# Patient Record
Sex: Female | Born: 1960 | Race: White | Hispanic: No | Smoking: Never smoker
Health system: Southern US, Community
[De-identification: ages and names within clinical notes are randomized; demographics above are authoritative.]

## PROBLEM LIST (undated history)

## (undated) DIAGNOSIS — D689 Coagulation defect, unspecified: Secondary | ICD-10-CM

## (undated) DIAGNOSIS — Z5189 Encounter for other specified aftercare: Secondary | ICD-10-CM

## (undated) DIAGNOSIS — N3281 Overactive bladder: Secondary | ICD-10-CM

## (undated) DIAGNOSIS — Z86718 Personal history of other venous thrombosis and embolism: Secondary | ICD-10-CM

## (undated) DIAGNOSIS — M797 Fibromyalgia: Secondary | ICD-10-CM

## (undated) DIAGNOSIS — Z86711 Personal history of pulmonary embolism: Secondary | ICD-10-CM

## (undated) DIAGNOSIS — M1990 Unspecified osteoarthritis, unspecified site: Secondary | ICD-10-CM

## (undated) DIAGNOSIS — K219 Gastro-esophageal reflux disease without esophagitis: Secondary | ICD-10-CM

## (undated) DIAGNOSIS — T7840XA Allergy, unspecified, initial encounter: Secondary | ICD-10-CM

## (undated) HISTORY — DX: Encounter for other specified aftercare: Z51.89

## (undated) HISTORY — DX: Allergy, unspecified, initial encounter: T78.40XA

## (undated) HISTORY — DX: Personal history of pulmonary embolism: Z86.711

## (undated) HISTORY — PX: WISDOM TOOTH EXTRACTION: SHX21

## (undated) HISTORY — PX: COLONOSCOPY: SHX174

## (undated) HISTORY — DX: Coagulation defect, unspecified: D68.9

## (undated) HISTORY — PX: TONSILLECTOMY: SUR1361

## (undated) HISTORY — PX: UPPER GASTROINTESTINAL ENDOSCOPY: SHX188

## (undated) HISTORY — PX: BACK SURGERY: SHX140

## (undated) HISTORY — DX: Personal history of other venous thrombosis and embolism: Z86.718

## (undated) HISTORY — PX: OTHER SURGICAL HISTORY: SHX169

## (undated) HISTORY — DX: Overactive bladder: N32.81

---

## 1995-05-26 DIAGNOSIS — Z5189 Encounter for other specified aftercare: Secondary | ICD-10-CM

## 1995-05-26 DIAGNOSIS — D689 Coagulation defect, unspecified: Secondary | ICD-10-CM

## 1995-05-26 HISTORY — DX: Coagulation defect, unspecified: D68.9

## 1995-05-26 HISTORY — DX: Encounter for other specified aftercare: Z51.89

## 2002-05-21 ENCOUNTER — Emergency Department (HOSPITAL_COMMUNITY): Admission: EM | Admit: 2002-05-21 | Discharge: 2002-05-21 | Payer: Self-pay | Admitting: Emergency Medicine

## 2002-12-19 ENCOUNTER — Ambulatory Visit (HOSPITAL_COMMUNITY): Admission: RE | Admit: 2002-12-19 | Discharge: 2002-12-19 | Payer: Self-pay | Admitting: Gastroenterology

## 2003-02-28 ENCOUNTER — Encounter: Admission: RE | Admit: 2003-02-28 | Discharge: 2003-02-28 | Payer: Self-pay | Admitting: Family Medicine

## 2003-02-28 ENCOUNTER — Encounter: Payer: Self-pay | Admitting: Family Medicine

## 2003-08-02 ENCOUNTER — Encounter: Admission: RE | Admit: 2003-08-02 | Discharge: 2003-08-02 | Payer: Self-pay | Admitting: Family Medicine

## 2003-11-06 ENCOUNTER — Ambulatory Visit (HOSPITAL_COMMUNITY): Admission: RE | Admit: 2003-11-06 | Discharge: 2003-11-06 | Payer: Self-pay | Admitting: Gynecology

## 2003-11-06 ENCOUNTER — Ambulatory Visit (HOSPITAL_BASED_OUTPATIENT_CLINIC_OR_DEPARTMENT_OTHER): Admission: RE | Admit: 2003-11-06 | Discharge: 2003-11-06 | Payer: Self-pay | Admitting: Gynecology

## 2004-05-25 HISTORY — PX: ABLATION: SHX5711

## 2004-08-21 ENCOUNTER — Ambulatory Visit (HOSPITAL_BASED_OUTPATIENT_CLINIC_OR_DEPARTMENT_OTHER): Admission: RE | Admit: 2004-08-21 | Discharge: 2004-08-21 | Payer: Self-pay | Admitting: Gynecology

## 2004-08-21 ENCOUNTER — Ambulatory Visit (HOSPITAL_COMMUNITY): Admission: RE | Admit: 2004-08-21 | Discharge: 2004-08-21 | Payer: Self-pay | Admitting: Gynecology

## 2005-07-02 ENCOUNTER — Other Ambulatory Visit: Admission: RE | Admit: 2005-07-02 | Discharge: 2005-07-02 | Payer: Self-pay | Admitting: Gynecology

## 2013-11-08 ENCOUNTER — Ambulatory Visit (INDEPENDENT_AMBULATORY_CARE_PROVIDER_SITE_OTHER): Payer: BC Managed Care – PPO | Admitting: Emergency Medicine

## 2013-11-08 VITALS — BP 128/72 | HR 82 | Temp 98.0°F | Resp 16 | Ht 62.0 in | Wt 128.2 lb

## 2013-11-08 DIAGNOSIS — Z23 Encounter for immunization: Secondary | ICD-10-CM

## 2013-11-08 DIAGNOSIS — S91319A Laceration without foreign body, unspecified foot, initial encounter: Secondary | ICD-10-CM

## 2013-11-08 DIAGNOSIS — S91309A Unspecified open wound, unspecified foot, initial encounter: Secondary | ICD-10-CM

## 2013-11-08 DIAGNOSIS — M79609 Pain in unspecified limb: Secondary | ICD-10-CM

## 2013-11-08 DIAGNOSIS — M79673 Pain in unspecified foot: Secondary | ICD-10-CM

## 2013-11-08 NOTE — Patient Instructions (Signed)

## 2013-11-08 NOTE — Progress Notes (Signed)
Patient ID: Neysa BonitoLaura Wrench MRN: [id number], DOB: 1961/02/22, 53 y.o. Date of Encounter: 11/08/2013, 6:33 PM   PROCEDURE NOTE: Verbal consent obtained. Sterile technique employed. Numbing: Anesthesia obtained with 1% lidocaine with epinephrine.   Cleansed with soap and water. Irrigated.  Wound explored, no deep structures involved, no foreign bodies.   Wound repaired with # 4 SI sutures using 4-0 ethilon.  Hemostasis obtained. Wound cleansed and dressed.  Wound care instructions including precautions covered with patient. Handout given.  Anticipate suture removal in 7-10 days  Rhoderick MoodyHeather Marte, PA-C 11/08/2013 6:33 PM

## 2013-11-08 NOTE — Progress Notes (Signed)
[facility] [address], [place] [id number] [phone]  Date:  11/08/2013   Name:  Neysa BonitoLaura Falls   DOB:  1961/03/15   MRN:  [id number]  PCP:  No PCP Per Patient    Chief Complaint: Laceration   History of Present Illness:  Neysa BonitoLaura Kilty is a 53 y.o. very pleasant female patient who presents with the following:  Injured today when a clipper fell and landed on her right lateral foot.  Not current on TD. Patient concerned as the wound was bleeding vigorously at the scene and that perhaps an artery requires repair  No improvement with over the counter medications or other home remedies. Denies other complaint or health concern today.   There are no active problems to display for this patient.   Past Medical History  Diagnosis Date  . Blood transfusion without reported diagnosis   . Clotting disorder     No past surgical history on file.  History  Substance Use Topics  . Smoking status: Never Smoker   . Smokeless tobacco: Not on file  . Alcohol Use: 1.0 oz/week    2 drink(s) per week    No family history on file.  Allergies  Allergen Reactions  . Latex Other (See Comments)    Skin irritation    Medication list has been reviewed and updated.  No current outpatient prescriptions on file prior to visit.   No current facility-administered medications on file prior to visit.    Review of Systems:  As per HPI, otherwise negative.    Physical Examination: Filed Vitals:   11/08/13 1742  BP: 128/72  Pulse: 82  Temp: 98 F (36.7 C)  Resp: 16   Filed Vitals:   11/08/13 1742  Height: 5\' 2"  (1.575 m)  Weight: 128 lb 3.2 oz (58.151 kg)   Body mass index is 23.44 kg/(m^2). Ideal Body Weight: Weight in (lb) to have BMI = 25: 136.4   GEN: WDWN, NAD, Non-toxic, Alert & Oriented x 3 HEENT: Atraumatic, Normocephalic.  Ears and Nose: No external deformity. EXTR: No clubbing/cyanosis/edema NEURO: Normal gait.  PSYCH: Normally  interactive. Conversant. Not depressed or anxious appearing.  Calm demeanor.  SKIN:  Laceration lateral aspect midfoot right.  NATI. No FB   Assessment and Plan: Laceration foot Laceration repair TDAP  Signed,  Phillips OdorJeffery Anderson, MD

## 2014-06-04 ENCOUNTER — Telehealth: Payer: Self-pay | Admitting: Obstetrics and Gynecology

## 2014-06-04 NOTE — Telephone Encounter (Signed)
Left patient a message to call back re: ngyn for aex was cancelled for 07/05/14 with Dr. Edward JollySilva. Offer 07/03/14 at 2 PM if still available.

## 2014-07-05 ENCOUNTER — Encounter: Payer: BC Managed Care – PPO | Admitting: Obstetrics and Gynecology

## 2014-07-10 ENCOUNTER — Ambulatory Visit: Payer: Self-pay | Admitting: Internal Medicine

## 2014-08-03 ENCOUNTER — Encounter: Payer: Self-pay | Admitting: Internal Medicine

## 2014-08-03 ENCOUNTER — Ambulatory Visit (INDEPENDENT_AMBULATORY_CARE_PROVIDER_SITE_OTHER): Payer: BLUE CROSS/BLUE SHIELD | Admitting: Internal Medicine

## 2014-08-03 ENCOUNTER — Other Ambulatory Visit (INDEPENDENT_AMBULATORY_CARE_PROVIDER_SITE_OTHER): Payer: BLUE CROSS/BLUE SHIELD

## 2014-08-03 VITALS — BP 120/76 | HR 93 | Temp 99.2°F | Resp 14 | Ht 63.0 in | Wt 150.4 lb

## 2014-08-03 DIAGNOSIS — M792 Neuralgia and neuritis, unspecified: Secondary | ICD-10-CM | POA: Diagnosis not present

## 2014-08-03 DIAGNOSIS — Z800 Family history of malignant neoplasm of digestive organs: Secondary | ICD-10-CM

## 2014-08-03 DIAGNOSIS — Z1239 Encounter for other screening for malignant neoplasm of breast: Secondary | ICD-10-CM

## 2014-08-03 DIAGNOSIS — Z0000 Encounter for general adult medical examination without abnormal findings: Secondary | ICD-10-CM

## 2014-08-03 DIAGNOSIS — M797 Fibromyalgia: Secondary | ICD-10-CM | POA: Diagnosis not present

## 2014-08-03 LAB — LIPID PANEL
CHOLESTEROL: 244 mg/dL — AB (ref 0–200)
HDL: 67.4 mg/dL (ref >39.00)
LDL Cholesterol: 154 mg/dL — ABNORMAL HIGH (ref 0–99)
NonHDL: 176.6
Total CHOL/HDL Ratio: 4
Triglycerides: 114 mg/dL (ref 0.0–149.0)
VLDL: 22.8 mg/dL (ref 0.0–40.0)

## 2014-08-03 LAB — HEPATITIS C ANTIBODY: HCV Ab: NEGATIVE

## 2014-08-03 LAB — HEMOGLOBIN A1C: Hgb A1c MFr Bld: 5.9 % (ref 4.6–6.5)

## 2014-08-03 LAB — T4, FREE: FREE T4: 0.73 ng/dL (ref 0.60–1.60)

## 2014-08-03 MED ORDER — PREGABALIN 50 MG PO CAPS: 50.0000 mg | ORAL_CAPSULE | Freq: Two times a day (BID) | ORAL | Status: DC

## 2014-08-03 NOTE — Progress Notes (Signed)
Pre visit review using our clinic review tool, if applicable. No additional management support is needed unless otherwise documented below in the visit note. 

## 2014-08-03 NOTE — Patient Instructions (Signed)
We will check some blood work for additional causes of the nerve pain. We would like you to try lyrica for the pain. You can take 1 pill up to 2 times per day. For the first 2 days take 1 pill in the evening to get it in your body. This is the starting dose and can be increased as we need to for your pain.   We would like to see you back in about 3-4 months to check on the pain. If the lyrica is not effective or helps some but not enough you can always come back sooner to adjust things. I am giving you information about fibromyalgia because it is one of the possibilities for you not because we know for sure that this is the cause. Sometimes things like water aerobics can help to retrain the body to reset some of the nerves to not have as much pain.   Fibromyalgia Fibromyalgia is a disorder that is often misunderstood. It is associated with muscular pains and tenderness that comes and goes. It is often associated with fatigue and sleep disturbances. Though it tends to be long-lasting, fibromyalgia is not life-threatening. CAUSES  The exact cause of fibromyalgia is unknown. People with certain gene types are predisposed to developing fibromyalgia and other conditions. Certain factors can play a role as triggers, such as:  Spine disorders.  Arthritis.  Severe injury (trauma) and other physical stressors.  Emotional stressors. SYMPTOMS   The main symptom is pain and stiffness in the muscles and joints, which can vary over time.  Sleep and fatigue problems. Other related symptoms may include:  Bowel and bladder problems.  Headaches.  Visual problems.  Problems with odors and noises.  Depression or mood changes.  Painful periods (dysmenorrhea).  Dryness of the skin or eyes. DIAGNOSIS  There are no specific tests for diagnosing fibromyalgia. Patients can be diagnosed accurately from the specific symptoms they have. The diagnosis is made by determining that nothing else is causing the  problems. TREATMENT  There is no cure. Management includes medicines and an active, healthy lifestyle. The goal is to enhance physical fitness, decrease pain, and improve sleep. HOME CARE INSTRUCTIONS   Only take over-the-counter or prescription medicines as directed by your caregiver. Sleeping pills, tranquilizers, and pain medicines may make your problems worse.  Low-impact aerobic exercise is very important and advised for treatment. At first, it may seem to make pain worse. Gradually increasing your tolerance will overcome this feeling.  Learning relaxation techniques and how to control stress will help you. Biofeedback, visual imagery, hypnosis, muscle relaxation, yoga, and meditation are all options.  Anti-inflammatory medicines and physical therapy may provide short-term help.  Acupuncture or massage treatments may help.  Take muscle relaxant medicines as suggested by your caregiver.  Avoid stressful situations.  Plan a healthy lifestyle. This includes your diet, sleep, rest, exercise, and friends.  Find and practice a hobby you enjoy.  Join a fibromyalgia support group for interaction, ideas, and sharing advice. This may be helpful. SEEK MEDICAL CARE IF:  You are not having good results or improvement from your treatment. FOR MORE INFORMATION  National Fibromyalgia Association: www.fmaware.org Arthritis Foundation: www.arthritis.org Document Released: 05/11/2005 Document Revised: 08/03/2011 Document Reviewed: 08/21/2009 [facility] Patient Information 2015 [place], [place]. This information is not intended to replace advice given to you by your health care provider. Make sure you discuss any questions you have with your health care provider.

## 2014-08-05 ENCOUNTER — Encounter: Payer: Self-pay | Admitting: Internal Medicine

## 2014-08-05 DIAGNOSIS — Z0000 Encounter for general adult medical examination without abnormal findings: Secondary | ICD-10-CM | POA: Insufficient documentation

## 2014-08-05 DIAGNOSIS — M797 Fibromyalgia: Secondary | ICD-10-CM | POA: Insufficient documentation

## 2014-08-05 NOTE — Progress Notes (Signed)
   Subjective:    Patient ID: Patricia Hart, female    DOB: 1960/06/29, 54 y.o.   MRN: [id number]  HPI The patient is a 54 YO female who is coming in as a new patient and complaining of chronic pain everywhere. She has been struggling with it for years and finally sent to a doctor about it and was started on cymbalta. She did well for the pain but felt like a zombie and had to stop about 2-3 weeks ago. She did not wish to return to that doctor. She brings in a copy of the lab tests that were done at that visit showing negative ANA, RF, TSH (did review during visit and explain to patient). She hurts all over from her toes to her hair. Nothing over the counter has been effective yet. She has not been to a doctor for a long time and has not had routine screening.   PMH, FMH, PSH, social history reviewed and updated.   Review of Systems  Constitutional: Positive for fatigue. Negative for fever, chills, activity change, appetite change and unexpected weight change.  HENT: Negative.   Eyes: Negative.   Respiratory: Negative for cough, chest tightness, shortness of breath and wheezing.   Cardiovascular: Negative for chest pain, palpitations and leg swelling.  Gastrointestinal: Positive for abdominal distention. Negative for abdominal pain, diarrhea and constipation.  Musculoskeletal: Positive for myalgias, arthralgias and neck pain.  Skin: Negative.   Neurological: Negative.   Psychiatric/Behavioral: Negative.       Objective:   Physical Exam  Constitutional: She appears well-developed and well-nourished.  HENT:  Head: Normocephalic and atraumatic.  Eyes: EOM are normal.  Neck: Normal range of motion.  Cardiovascular: Normal rate and regular rhythm.   Pulmonary/Chest: Effort normal and breath sounds normal. No respiratory distress. She has no wheezes.  Abdominal: Soft. Bowel sounds are normal. She exhibits no distension. There is no tenderness.  Musculoskeletal: She exhibits no edema.  No  tender spots to palpation  Neurological: Coordination normal.  Skin: Skin is warm and dry.  Psychiatric: She has a normal mood and affect.   Filed Vitals:   08/03/14 1317  BP: 120/76  Pulse: 93  Temp: 99.2 F (37.3 C)  TempSrc: Oral  Resp: 14  Height: 5\' 3"  (1.6 m)  Weight: 150 lb 6.4 oz (68.221 kg)  SpO2: 96%      Assessment & Plan:

## 2014-08-05 NOTE — Assessment & Plan Note (Signed)
This is a possible etiology. Not classic exam for fibromyalgia although the description appears to fit. Good reaction for pain to cymbalta. Will trial low dose lyrica to see if this helps without making her too drowsy. Offered HIV screening and she declines, checking other etiology to explain her symptoms including HgA1c, hepatitis C, free T4 (she had massive bleeding after 1 birth so TSH can be inaccurate).

## 2014-08-05 NOTE — Assessment & Plan Note (Signed)
Ordered mammogram and referral to GI (for colon cancer screening and encouraged given abdominal bloating symptoms). Declined flu shot today. Reminded her about pap smear need as well.

## 2014-09-04 ENCOUNTER — Telehealth: Payer: Self-pay | Admitting: Internal Medicine

## 2014-09-04 NOTE — Telephone Encounter (Signed)
Patient needs a refill on Lyrica, but would like to know if we can increase the dosage. She states it is helping, but not much. Pt uses [facility] [address]

## 2014-09-05 MED ORDER — PREGABALIN 100 MG PO CAPS: 100.0000 mg | ORAL_CAPSULE | Freq: Two times a day (BID) | ORAL | Status: DC

## 2014-09-05 NOTE — Telephone Encounter (Signed)
Have printed and signed and we will fax.

## 2014-09-05 NOTE — Telephone Encounter (Signed)
Sent to pharmacy 

## 2014-09-07 ENCOUNTER — Encounter: Payer: Self-pay | Admitting: Internal Medicine

## 2014-09-20 ENCOUNTER — Ambulatory Visit (INDEPENDENT_AMBULATORY_CARE_PROVIDER_SITE_OTHER): Payer: BLUE CROSS/BLUE SHIELD | Admitting: Obstetrics and Gynecology

## 2014-09-20 ENCOUNTER — Encounter: Payer: Self-pay | Admitting: Obstetrics and Gynecology

## 2014-09-20 VITALS — BP 100/72 | HR 82 | Ht 63.25 in | Wt 150.2 lb

## 2014-09-20 DIAGNOSIS — Z0000 Encounter for general adult medical examination without abnormal findings: Secondary | ICD-10-CM | POA: Diagnosis not present

## 2014-09-20 DIAGNOSIS — Z01419 Encounter for gynecological examination (general) (routine) without abnormal findings: Secondary | ICD-10-CM | POA: Diagnosis not present

## 2014-09-20 DIAGNOSIS — R319 Hematuria, unspecified: Secondary | ICD-10-CM

## 2014-09-20 DIAGNOSIS — A630 Anogenital (venereal) warts: Secondary | ICD-10-CM

## 2014-09-20 NOTE — Progress Notes (Signed)
54 y.o. [id number] Divorced Caucasian female here for annual exam.   Status post ablation in 2009 or 2010.  Really happy with that treatment.  Had hot flashes and does occasionally.   Concerned she have genital warts.  Has had over the last year.  No treatment.   Hx of DVT/PE in pregnancy in 1997.  Not estrogen candidate.   Having a not of neuropathy in the last year.  Taking Lyrica for pain.   Voids often.  Likes caffeine beverages.   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  Renovating her home.  PCP:  Genella Mech, MD  Patient's last menstrual period was 05/25/2004.          Sexually active: Yes.   ;  not currently The current method of family planning is Ablation.    Exercising: Yes.    walking Smoker:  no  Health Maintenance: Pap:  10 years ago-Normal History of abnormal Pap:  no MMG:  10 years ago- wnl Colonoscopy:  10 years ago- wnl f/u in 5 years.  Will schedule. BMD:   Never had one  Result  n/a TDaP:  11/08/13  Screening Labs:  Hb today: no , Urine today: Trace blood   reports that she has never smoked. She does not have any smokeless tobacco history on file. She reports that she drinks about 1.2 oz of alcohol per week. She reports that she does not use illicit drugs.  Past Medical History  Diagnosis Date  . Blood transfusion without reported diagnosis 1997  . Clotting disorder 1997    pregnancy induced dvt- no problems since then  . Overactive bladder   . History of DVT (deep vein thrombosis)     during pregnancy  . History of pulmonary embolism     occurred during pregnancy    Past Surgical History  Procedure Laterality Date  . Ablation  2006  . Breast implants      saline    Current Outpatient Prescriptions  Medication Sig Dispense Refill  . pregabalin (LYRICA) 100 MG capsule Take 1 capsule (100 mg total) by mouth 2 (two) times daily. 60 capsule 2   No current facility-administered  medications for this visit.    Family History  Problem Relation Age of Onset  . Hyperlipidemia Mother   . Cancer Mother     colon; lung  . Diabetes Father   . Hyperlipidemia Father   . Hypertension Father   . Breast cancer Maternal Aunt     50's  . Osteoporosis Mother     ROS:  Pertinent items are noted in HPI.  Otherwise, a comprehensive ROS was negative.  Exam:   BP 100/72 mmHg  Pulse 82  Ht 5' 3.25" (1.607 m)  Wt 150 lb 3.2 oz (68.13 kg)  BMI 26.38 kg/m2  LMP 05/25/2004    General appearance: alert, cooperative and appears stated age Head: Normocephalic, without obvious abnormality, atraumatic Neck: no adenopathy, supple, symmetrical, trachea midline and thyroid normal to inspection and palpation Lungs: clear to auscultation bilaterally Breasts: normal appearance, no masses or tenderness, No nipple retraction or dimpling, No nipple discharge or bleeding, No axillary or supraclavicular adenopathy, Consistent with bilateral implants. Heart: regular rate and rhythm Abdomen: soft, non-tender; bowel sounds normal; no masses,  no organomegaly Extremities: extremities normal, atraumatic, no cyanosis or edema Skin: Skin color, texture, turgor normal. No rashes or lesions Lymph nodes: Cervical, supraclavicular, and axillary nodes normal. No abnormal inguinal nodes  palpated Neurologic: Grossly normal  Pelvic: External genitalia:  no lesions              Urethra:  normal appearing urethra with no masses, tenderness or lesions              Bartholins and Skenes: normal                 Vagina: normal appearing vagina with normal color and discharge, no lesions              Cervix: no lesions              Pap taken: Yes.   Bimanual Exam:  Uterus:  normal size, contour, position, consistency, mobility, non-tender              Adnexa: normal adnexa and no mass, fullness, tenderness              Rectovaginal: Yes.  .  Confirms.              Anus:  normal sphincter tone, no  lesions  Chaperone was present for exam.  Assessment:   Well woman visit with normal exam. Status post endometrial ablation.  Hx of DVT/PE in pregnancy.  Not estrogen candidate.  Perianal condyloma.  Microscopic hematuria.  Neuropathy.  On Lyrica.   Plan: Yearly mammogram recommended after age 53.  [facility]  Patient will call to schedule an appointment. Recommended self breast exam.  Pap and HR HPV as above. Discussed Calcium, Vitamin D, regular exercise program including cardiovascular and weight bearing exercise. Labs performed.  Yes.  .   See orders.  Urine micro and culture. Refills given on medications.  No..  See orders. Follow up annually and prn.  I anticipate patient to return for treatment of perianal condyloma.  Will await pap to see if needs colposcopy at the same time.   After visit summary provided.

## 2014-09-20 NOTE — Patient Instructions (Signed)
EXERCISE AND DIET:  We recommended that you start or continue a regular exercise program for good health. Regular exercise means any activity that makes your heart beat faster and makes you sweat.  We recommend exercising at least 30 minutes per day at least 3 days a week, preferably 4 or 5.  We also recommend a diet low in fat and sugar.  Inactivity, poor dietary choices and obesity can cause diabetes, heart attack, stroke, and kidney damage, among others.    ALCOHOL AND SMOKING:  Women should limit their alcohol intake to no more than 7 drinks/beers/glasses of wine (combined, not each!) per week. Moderation of alcohol intake to this level decreases your risk of breast cancer and liver damage. And of course, no recreational drugs are part of a healthy lifestyle.  And absolutely no smoking or even second hand smoke. Most people know smoking can cause heart and lung diseases, but did you know it also contributes to weakening of your bones? Aging of your skin?  Yellowing of your teeth and nails?  CALCIUM AND VITAMIN D:  Adequate intake of calcium and Vitamin D are recommended.  The recommendations for exact amounts of these supplements seem to change often, but generally speaking 600 mg of calcium (either carbonate or citrate) and 800 units of Vitamin D per day seems prudent. Certain women may benefit from higher intake of Vitamin D.  If you are among these women, your doctor will have told you during your visit.    PAP SMEARS:  Pap smears, to check for cervical cancer or precancers,  have traditionally been done yearly, although recent scientific advances have shown that most women can have pap smears less often.  However, every woman still should have a physical exam from her gynecologist every year. It will include a breast check, inspection of the vulva and vagina to check for abnormal growths or skin changes, a visual exam of the cervix, and then an exam to evaluate the size and shape of the uterus and  ovaries.  And after 53 years of age, a rectal exam is indicated to check for rectal cancers. We will also provide age appropriate advice regarding health maintenance, like when you should have certain vaccines, screening for sexually transmitted diseases, bone density testing, colonoscopy, mammograms, etc.   MAMMOGRAMS:  All women over 40 years old should have a yearly mammogram. Many facilities now offer a "3D" mammogram, which may cost around $50 extra out of pocket. If possible,  we recommend you accept the option to have the 3D mammogram performed.  It both reduces the number of women who will be called back for extra views which then turn out to be normal, and it is better than the routine mammogram at detecting truly abnormal areas.    COLONOSCOPY:  Colonoscopy to screen for colon cancer is recommended for all women at age 50.  We know, you hate the idea of the prep.  We agree, BUT, having colon cancer and not knowing it is worse!!  Colon cancer so often starts as a polyp that can be seen and removed at colonscopy, which can quite literally save your life!  And if your first colonoscopy is normal and you have no family history of colon cancer, most women don't have to have it again for 10 years.  Once every ten years, you can do something that may end up saving your life, right?  We will be happy to help you get it scheduled when you are ready.    Be sure to check your insurance coverage so you understand how much it will cost.  It may be covered as a preventative service at no cost, but you should check your particular policy.     Genital Warts Genital warts are a sexually transmitted infection. They may appear as small bumps on the tissues of the genital area. CAUSES  Genital warts are caused by a virus called human papillomavirus (HPV). HPV is the most common sexually transmitted disease (STD) and infection of the sex organs. This infection is spread by having unprotected sex with an infected  person. It can be spread by vaginal, anal, and oral sex. Many people do not know they are infected. They may be infected for years without problems. However, even if they do not have problems, they can unknowingly pass the infection to their sexual partners. SYMPTOMS   Itching and irritation in the genital area.  Warts that bleed.  Painful sexual intercourse. DIAGNOSIS  Warts are usually recognized with the naked eye on the vagina, vulva, perineum, anus, and rectum. Certain tests can also diagnose genital warts, such as:  A Pap test.  A tissue sample (biopsy) exam.  Colposcopy. A magnifying tool is used to examine the vagina and cervix. The HPV cells will change color when certain solutions are used. TREATMENT  Warts can be removed by:  Applying certain chemicals, such as cantharidin or podophyllin.  Liquid nitrogen freezing (cryotherapy).  Immunotherapy with Candida or Trichophyton injections.  Laser treatment.  Burning with an electrified probe (electrocautery).  Interferon injections.  Surgery. PREVENTION  HPV vaccination can help prevent HPV infections that cause genital warts and that cause cancer of the cervix. It is recommended that the vaccination be given to people between the ages 9 to 26 years old. The vaccine might not work as well or might not work at all if you already have HPV. It should not be given to pregnant women. HOME CARE INSTRUCTIONS   It is important to follow your caregiver's instructions. The warts will not go away without treatment. Repeat treatments are often needed to get rid of warts. Even after it appears that the warts are gone, the normal tissue underneath often remains infected.  Do not try to treat genital warts with medicine used to treat hand warts. This type of medicine is strong and can burn the skin in the genital area, causing more damage.  Tell your past and current sexual partner(s) that you have genital warts. They may be infected  also and need treatment.  Avoid sexual contact while being treated.  Do not touch or scratch the warts. The infection may spread to other parts of your body.  Women with genital warts should have a cervical cancer check (Pap test) at least once a year. This type of cancer is slow-growing and can be cured if found early. Chances of developing cervical cancer are increased with HPV.  Inform your obstetrician about your warts in the event of pregnancy. This virus can be passed to the baby's respiratory tract. Discuss this with your caregiver.  Use a condom during sexual intercourse. Following treatment, the use of condoms will help prevent reinfection.  Ask your caregiver about using over-the-counter anti-itch creams. SEEK MEDICAL CARE IF:   Your treated skin becomes red, swollen, or painful.  You have a fever.  You feel generally ill.  You feel little lumps in and around your genital area.  You are bleeding or have painful sexual intercourse. MAKE SURE YOU:     Understand these instructions.  Will watch your condition.  Will get help right away if you are not doing well or get worse. Document Released: 05/08/2000 Document Revised: 09/25/2013 Document Reviewed: 11/17/2010 ExitCare Patient Information 2015 [employer]. This information is not intended to replace advice given to you by your health care provider. Make sure you discuss any questions you have with your health care provider.  

## 2014-09-21 LAB — URINALYSIS, MICROSCOPIC ONLY
BACTERIA UA: NONE SEEN
Casts: NONE SEEN
Crystals: NONE SEEN
SQUAMOUS EPITHELIAL / LPF: NONE SEEN

## 2014-09-21 LAB — URINE CULTURE
Colony Count: NO GROWTH
ORGANISM ID, BACTERIA: NO GROWTH

## 2014-09-24 LAB — IPS PAP TEST WITH HPV

## 2014-09-25 ENCOUNTER — Telehealth (INDEPENDENT_AMBULATORY_CARE_PROVIDER_SITE_OTHER): Payer: BLUE CROSS/BLUE SHIELD | Admitting: Emergency Medicine

## 2014-09-25 ENCOUNTER — Telehealth: Payer: Self-pay | Admitting: Emergency Medicine

## 2014-09-25 DIAGNOSIS — A630 Anogenital (venereal) warts: Secondary | ICD-10-CM

## 2014-09-25 NOTE — Telephone Encounter (Signed)
Encounter opened erroneously.   Closed encounter.   

## 2014-09-25 NOTE — Telephone Encounter (Deleted)
-----   Message from Brook E Amundson C Silva, MD sent at 09/25/2014 12:19 PM EDT ----- Please report pap and urine test results to patient.  Pap is ASCUS and HR HPV testing is negative.  This is considered a normal pap and no further evaluation or treatment of the cervix is needed.  Annual exam recall - 02.   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  She will need to return for treatment with TCA or cryotherapy.  Please assist in scheduling this appointment.  We were waiting for the pap to come back to see if she needed colpo also.  (She does not.)  Urine micro and culture were negative.   Thanks.  Cc- Amanda Dixon 

## 2014-09-25 NOTE — Telephone Encounter (Signed)
Spoke with patient and message from Dr. Edward JollySilva given.  Patient given results of pap smear from Dr. Edward JollySilva and 02 Recall placed.   Patient requests first available appointment for condyloma removal and appointment is scheduled for 09/28/14. Patient advised would be contacted with insurance coverage information. Patient agreeable. Routing to provider for final review. Patient agreeable to disposition. Patient aware MD will review message and nurse will return call with any additional instructions or change of disposition. Will close encounter.    cc Cathrine MusterSabrina [place]

## 2014-09-25 NOTE — Telephone Encounter (Signed)
-----   Message from Patton SallesBrook E Amundson C Silva, MD sent at 09/25/2014 12:19 PM EDT ----- Please report pap and urine test results to patient.  Pap is ASCUS and HR HPV testing is negative.  This is considered a normal pap and no further evaluation or treatment of the cervix is needed.  Annual exam recall - 02.   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  She will need to return for treatment with TCA or cryotherapy.  Please assist in scheduling this appointment.  We were waiting for the pap to come back to see if she needed colpo also.  (She does not.)  Urine micro and culture were negative.   Thanks.  Cc- Claudette LawsAmanda Dixon

## 2014-09-26 ENCOUNTER — Other Ambulatory Visit: Payer: Self-pay | Admitting: Obstetrics and Gynecology

## 2014-09-26 ENCOUNTER — Encounter: Payer: Self-pay | Admitting: Gastroenterology

## 2014-09-26 DIAGNOSIS — A630 Anogenital (venereal) warts: Secondary | ICD-10-CM

## 2014-09-27 ENCOUNTER — Encounter: Payer: Self-pay | Admitting: Gastroenterology

## 2014-09-28 ENCOUNTER — Encounter: Payer: Self-pay | Admitting: Obstetrics and Gynecology

## 2014-09-28 ENCOUNTER — Ambulatory Visit (INDEPENDENT_AMBULATORY_CARE_PROVIDER_SITE_OTHER): Payer: BLUE CROSS/BLUE SHIELD | Admitting: Obstetrics and Gynecology

## 2014-09-28 DIAGNOSIS — A630 Anogenital (venereal) warts: Secondary | ICD-10-CM | POA: Diagnosis not present

## 2014-09-28 NOTE — Progress Notes (Signed)
Patient ID: Patricia BonitoLaura Szuch, female   DOB: May 01, 1961, 54 y.o.   MRN: [id number] GYNECOLOGY  VISIT   HPI: 54 y.o.   Divorced  Caucasian  female   G1P1001 with Patient's last menstrual period was 05/25/2004.   here for treatment of condyloma.    Pap 4/28 - ASCUS and negative HR HPV.   History of pulmonary embolism during pregnancy.   GYNECOLOGIC HISTORY: Patient's last menstrual period was 05/25/2004. Contraception:  ablation  Menopausal hormone therapy: none    Last pap: 09-20-14  Ascus:neg HR HPV Last mammogram:  2004 normal OB History    Gravida Para Term Preterm AB TAB SAB Ectopic Multiple Living   1 1 1       1          Patient Active Problem List   Diagnosis Date Noted  . Fibromyalgia 08/05/2014  . Routine general medical examination at a health care facility 08/05/2014    Past Medical History  Diagnosis Date  . Blood transfusion without reported diagnosis 1997  . Clotting disorder 1997    pregnancy induced dvt- no problems since then  . Overactive bladder   . History of DVT (deep vein thrombosis)     during pregnancy  . History of pulmonary embolism     occurred during pregnancy    Past Surgical History  Procedure Laterality Date  . Ablation  2006  . Breast implants      saline    Current Outpatient Prescriptions  Medication Sig Dispense Refill  . pregabalin (LYRICA) 100 MG capsule Take 1 capsule (100 mg total) by mouth 2 (two) times daily. 60 capsule 2   No current facility-administered medications for this visit.     ALLERGIES: Latex  Family History  Problem Relation Age of Onset  . Hyperlipidemia Mother   . Cancer Mother     colon; lung  . Diabetes Father   . Hyperlipidemia Father   . Hypertension Father   . Breast cancer Maternal Aunt     50's  . Osteoporosis Mother     History   Social History  . Marital Status: Divorced    Spouse Name: N/A  . Number of Children: N/A  . Years of Education: N/A   Occupational History  . Not on file.    Social History Main Topics  . Smoking status: Never Smoker   . Smokeless tobacco: Not on file  . Alcohol Use: 1.2 oz/week    2 Standard drinks or equivalent per week     Comment: 0-4 drinks/wk  . Drug Use: No  . Sexual Activity:    Partners: Male    Birth Control/ Protection: Surgical     Comment: Ablation   Other Topics Concern  . Not on file   Social History Narrative    ROS:  Pertinent items are noted in HPI.  PHYSICAL EXAMINATION:    BP 106/78 mmHg  Pulse 76  Ht 5' 3.25" (1.607 m)  Wt 151 lb 12.8 oz (68.856 kg)  BMI 26.66 kg/m2  LMP 05/25/2004     General appearance: alert, cooperative and appears stated age Lungs: clear to auscultation bilaterally Heart: regular rate and rhythm Abdomen: soft, non-tender; no masses,  no organomegaly No abnormal inguinal nodes palpated  Pelvic: External genitalia:  no lesions              Anus:  normal sphincter tone,  Multiple condyloma noted perianally to right and left of anal opening.   Procedure  Consent for treatment  of condyloma.  Cryotherapy of condyloma performed. Good ice formation on condyloma.  Tolerated well.   ASSESSMENT  Perianal condyloma.  PLAN  Discussion of HPV, abnormal paps, and condyloma. Treatment performed.  Return in 10 days for a recheck.    An After Visit Summary was printed and given to the patient.  ___15___ minutes face to face time of which over 50% was spent in counseling.

## 2014-10-08 ENCOUNTER — Encounter: Payer: Self-pay | Admitting: Obstetrics and Gynecology

## 2014-10-08 ENCOUNTER — Ambulatory Visit (INDEPENDENT_AMBULATORY_CARE_PROVIDER_SITE_OTHER): Payer: BLUE CROSS/BLUE SHIELD | Admitting: Obstetrics and Gynecology

## 2014-10-08 VITALS — BP 120/78 | HR 70 | Ht 63.25 in | Wt 154.2 lb

## 2014-10-08 DIAGNOSIS — A630 Anogenital (venereal) warts: Secondary | ICD-10-CM | POA: Diagnosis not present

## 2014-10-08 NOTE — Progress Notes (Signed)
Patient ID: Patricia BonitoLaura Billups, female   DOB: 30-Mar-1961, 54 y.o.   MRN: [id number] GYNECOLOGY  VISIT   HPI: 54 y.o.   Divorced  Caucasian  female   G1P1001 with Patient's last menstrual period was 05/25/2004.   here for 10 day follow up. Patient states may see some improvement.  Patient has nerve pain, back pain, and joint pain.  Takes Lyrica.  Wants to add to her health care team and find someone who will expand their view of causes and treatment for her pain.   GYNECOLOGIC HISTORY: Patient's last menstrual period was 05/25/2004. Contraception: Ablation Menopausal hormone therapy: n/a Last mammogram: 10 years WUJ:[id number]:normal Last pap smear: 09-20-14 Ascus:neg HR HPV        OB History    Gravida Para Term Preterm AB TAB SAB Ectopic Multiple Living   1 1 1       1          Patient Active Problem List   Diagnosis Date Noted  . Fibromyalgia 08/05/2014  . Routine general medical examination at a health care facility 08/05/2014    Past Medical History  Diagnosis Date  . Blood transfusion without reported diagnosis 1997  . Clotting disorder 1997    pregnancy induced dvt- no problems since then  . Overactive bladder   . History of DVT (deep vein thrombosis)     during pregnancy  . History of pulmonary embolism     occurred during pregnancy    Past Surgical History  Procedure Laterality Date  . Ablation  2006  . Breast implants      saline    Current Outpatient Prescriptions  Medication Sig Dispense Refill  . pregabalin (LYRICA) 100 MG capsule Take 1 capsule (100 mg total) by mouth 2 (two) times daily. 60 capsule 2   No current facility-administered medications for this visit.     ALLERGIES: Latex  Family History  Problem Relation Age of Onset  . Hyperlipidemia Mother   . Cancer Mother     colon; lung  . Diabetes Father   . Hyperlipidemia Father   . Hypertension Father   . Breast cancer Maternal Aunt     50's  . Osteoporosis Mother     History   Social History   . Marital Status: Divorced    Spouse Name: N/A  . Number of Children: N/A  . Years of Education: N/A   Occupational History  . Not on file.   Social History Main Topics  . Smoking status: Never Smoker   . Smokeless tobacco: Not on file  . Alcohol Use: 1.2 oz/week    2 Standard drinks or equivalent per week     Comment: 0-4 drinks/wk  . Drug Use: No  . Sexual Activity:    Partners: Male    Birth Control/ Protection: Surgical     Comment: Ablation   Other Topics Concern  . Not on file   Social History Narrative    ROS:  Pertinent items are noted in HPI.  PHYSICAL EXAMINATION:    BP 120/78 mmHg  Pulse 70  Ht 5' 3.25" (1.607 m)  Wt 154 lb 3.2 oz (69.945 kg)  BMI 27.08 kg/m2  LMP 05/25/2004    General appearance: alert, cooperative and appears stated age  Pelvic: External genitalia:  no lesions               Anus:  Multiple condyloma present around anus externally.  Procedure - TCA to condyloma. Consent for procedure.  TCA 80%  applied directly to each condyloma. White change noted of condyloma with application.  Tolerated well.  No complications.  No EBL.  Chaperone was present for exam.  ASSESSMENT  Perianal condyloma. Chronic pain.  Fibromyalgia.   PLAN  Counseled regarding condyloma and treatment options including Aldara, biopsy and removal, and CO2 laser. Treatment will not remove the virus from the tissue. Return in 10 days for a recheck.  Gave patient name of Dr. Allena NapoleonElizabeth Vaughn. May also need Rheumatologist if does not have one.   An After Visit Summary was printed and given to the patient.  ___10___ minutes face to face time of which over 50% was spent in counseling.

## 2014-10-17 ENCOUNTER — Other Ambulatory Visit: Payer: Self-pay | Admitting: Gastroenterology

## 2014-10-17 DIAGNOSIS — R1013 Epigastric pain: Secondary | ICD-10-CM

## 2014-10-19 ENCOUNTER — Telehealth: Payer: Self-pay | Admitting: Obstetrics and Gynecology

## 2014-10-19 ENCOUNTER — Ambulatory Visit: Payer: BLUE CROSS/BLUE SHIELD | Admitting: Obstetrics and Gynecology

## 2014-10-19 NOTE — Telephone Encounter (Signed)
Patient came to her appointment today for a 10 day recheck but was 20 minutes late. She rescheduled to 10/24/14.

## 2014-10-19 NOTE — Telephone Encounter (Signed)
Thank you for the update.  Encounter closed. 

## 2014-10-23 ENCOUNTER — Telehealth: Payer: Self-pay | Admitting: *Deleted

## 2014-10-23 NOTE — Telephone Encounter (Signed)
Left mess for patient to call back to see when her last mammogram was. Order was placed 08/03/14 by PCP. No results in EMR.

## 2014-10-24 ENCOUNTER — Encounter: Payer: Self-pay | Admitting: Obstetrics and Gynecology

## 2014-10-24 ENCOUNTER — Ambulatory Visit: Payer: BLUE CROSS/BLUE SHIELD | Admitting: Obstetrics and Gynecology

## 2014-10-24 ENCOUNTER — Ambulatory Visit (INDEPENDENT_AMBULATORY_CARE_PROVIDER_SITE_OTHER): Payer: BLUE CROSS/BLUE SHIELD | Admitting: Obstetrics and Gynecology

## 2014-10-24 VITALS — BP 120/80 | HR 76 | Ht 63.25 in | Wt 156.8 lb

## 2014-10-24 DIAGNOSIS — A630 Anogenital (venereal) warts: Secondary | ICD-10-CM

## 2014-10-24 NOTE — Progress Notes (Signed)
Patient ID: Patricia Hart, female   DOB: 1961/04/26, 54 y.o.   MRN: [id number] GYNECOLOGY  VISIT   HPI: 54 y.o.   Divorced  Caucasian  female   G1P1001 with No LMP recorded. Patient has had an ablation.   here for follow-up on condyloma.  Patient states she is seeing results with TCA.  Seeing GI. Having gall bladder ultrasound tomorrow.   Would like to consider seeing Rheumatologist for her fibromyalgia.   Dr. Modena Jansky - PCP.  GYNECOLOGIC HISTORY: No LMP recorded. Patient has had an ablation. Contraception: Ablation Menopausal hormone therapy: n/a Last mammogram: 10 years OZH:YQMVHQ(IO. Knows she needs to schedule) Last pap smear: 09-20-14 Ascus:neg HR HPV        OB History    Gravida Para Term Preterm AB TAB SAB Ectopic Multiple Living   Patient Active Problem List   Diagnosis Date Noted  . Fibromyalgia 08/05/2014  . Routine general medical examination at a health care facility 08/05/2014    Past Medical History  Diagnosis Date  . Blood transfusion without reported diagnosis 1997  . Clotting disorder 1997    pregnancy induced dvt- no problems since then  . Overactive bladder   . History of DVT (deep vein thrombosis)     during pregnancy  . History of pulmonary embolism     occurred during pregnancy    Past Surgical History  Procedure Laterality Date  . Ablation  2006  . Breast implants      saline    Current Outpatient Prescriptions  Medication Sig Dispense Refill  . pregabalin (LYRICA) 100 MG capsule Take 1 capsule (100 mg total) by mouth 2 (two) times daily. 60 capsule 2   No current facility-administered medications for this visit.     ALLERGIES: Latex  Family History  Problem Relation Age of Onset  . Hyperlipidemia Mother   . Cancer Mother     colon; lung  . Diabetes Father   . Hyperlipidemia Father   . Hypertension Father   . Breast cancer Maternal Aunt     50's  . Osteoporosis Mother     History   Social History  .  Marital Status: Divorced    Spouse Name: N/A  . Number of Children: N/A  . Years of Education: N/A   Occupational History  . Not on file.   Social History Main Topics  . Smoking status: Never Smoker   . Smokeless tobacco: Not on file  . Alcohol Use: 1.2 oz/week    2 Standard drinks or equivalent per week     Comment: 0-4 drinks/wk  . Drug Use: No  . Sexual Activity:    Partners: Male    Birth Control/ Protection: Surgical     Comment: Ablation   Other Topics Concern  . Not on file   Social History Narrative    ROS:  Pertinent items are noted in HPI.  PHYSICAL EXAMINATION:    BP 120/80 mmHg  Pulse 76  Ht 5' 3.25" (1.607 m)  Wt 156 lb 12.8 oz (71.124 kg)  BMI 27.54 kg/m2    General appearance: alert, cooperative and appears stated age   Pelvic: External genitalia:  no lesions  Anus:  normal sphincter tone, multiple perianal condyloma.  Procedure - TCA to condyloma. Consent for procedure.  TCA 80% applied directly to each condyloma. White change noted of condyloma with application.  Tolerated well.  No complications.  No EBL.  Chaperone was present for exam.  ASSESSMENT  Perianal condyloma. Status post cryotherapy x 1 and now 2nd TCA treatment.   Fibromyalgia  PLAN  Counseled regarding other treatment choices - Aldara versus biopsy and removal.  Will plan for potential biopsy/removal at next appointment.  Gave name of Rheumatologists in town.  Return in 3 weeks.   An After Visit Summary was printed and given to the patient.  _15_____ minutes face to face time of which over 50% was spent in counseling.

## 2014-10-25 ENCOUNTER — Ambulatory Visit
Admission: RE | Admit: 2014-10-25 | Discharge: 2014-10-25 | Disposition: A | Discharge status: Home / Self Care | Payer: BLUE CROSS/BLUE SHIELD | Source: Ambulatory Visit | Attending: Gastroenterology | Admitting: Gastroenterology

## 2014-10-25 DIAGNOSIS — R1013 Epigastric pain: Secondary | ICD-10-CM

## 2014-10-25 NOTE — Telephone Encounter (Signed)
Pt called back- she states she is having her Colonoscopy on Monday and wants to wait until that is over. She states she will have her mammogram afterwards. Pt is scheduled to see PCP /9/16.

## 2014-10-29 ENCOUNTER — Telehealth: Payer: Self-pay | Admitting: Obstetrics and Gynecology

## 2014-10-29 NOTE — Telephone Encounter (Signed)
Left message for patient to call back. Need to go over benefits and schedule vulvar biopsy.

## 2014-11-01 ENCOUNTER — Encounter: Payer: Self-pay | Admitting: Internal Medicine

## 2014-11-01 ENCOUNTER — Ambulatory Visit (INDEPENDENT_AMBULATORY_CARE_PROVIDER_SITE_OTHER): Payer: BLUE CROSS/BLUE SHIELD | Admitting: Internal Medicine

## 2014-11-01 VITALS — BP 122/72 | HR 75 | Temp 98.0°F | Resp 14 | Ht 63.0 in | Wt 152.1 lb

## 2014-11-01 DIAGNOSIS — M79645 Pain in left finger(s): Secondary | ICD-10-CM

## 2014-11-01 DIAGNOSIS — B379 Candidiasis, unspecified: Secondary | ICD-10-CM

## 2014-11-01 DIAGNOSIS — R5383 Other fatigue: Secondary | ICD-10-CM | POA: Diagnosis not present

## 2014-11-01 DIAGNOSIS — M79644 Pain in right finger(s): Secondary | ICD-10-CM

## 2014-11-01 DIAGNOSIS — M797 Fibromyalgia: Secondary | ICD-10-CM | POA: Diagnosis not present

## 2014-11-01 MED ORDER — FLUCONAZOLE 150 MG PO TABS: 150.0000 mg | ORAL_TABLET | ORAL | Status: DC

## 2014-11-01 MED ORDER — PREGABALIN 150 MG PO CAPS: 150.0000 mg | ORAL_CAPSULE | Freq: Two times a day (BID) | ORAL | Status: DC

## 2014-11-01 NOTE — Progress Notes (Signed)
Pre visit review using our clinic review tool, if applicable. No additional management support is needed unless otherwise documented below in the visit note. 

## 2014-11-01 NOTE — Patient Instructions (Signed)
We have sent in the diflucan for the yeast infection. Take 1 pill today and then if you are not better on Sunday take the second pill then.   We have increased the lyrica to see if this helps more.   Come back some morning before 9AM to check the cortisol level and will call you back with the results.

## 2014-11-02 DIAGNOSIS — B379 Candidiasis, unspecified: Secondary | ICD-10-CM | POA: Insufficient documentation

## 2014-11-02 NOTE — Assessment & Plan Note (Signed)
Increase lyrica to 150 mg BID. She is doing okay and overall is functioning better. Talked to her about water aerobics as a way to exercise while not putting pressure on her joints. Plus this is a good support for her to talk to others at water aerobics.

## 2014-11-02 NOTE — Assessment & Plan Note (Signed)
Diflucan rx given. Likely colonoscopy started the infection.

## 2014-11-02 NOTE — Progress Notes (Signed)
   Subjective:    Patient ID: Patricia Hart, female    DOB: [birth year], 53 y.o.   MRN: [id number]  HPI The patient is a 54 YO female here for follow up of her fibromyalgia as well as new yeast infection. She started taking lyrica 50 mg BID and did well and increased to 100 mg BID with no or minimal side effects. She is still having pain but no grogginess or tiredness. It is not working as well as the cymbalta (but this made her a zombie).   The yeast infection started 1-2 days ago and is worsening. Burning in the area. Discharge white. Not tried anything for it. Happened after colonoscopy.   Review of Systems  Constitutional: Positive for fatigue. Negative for fever, chills, activity change, appetite change and unexpected weight change.  HENT: Negative.   Eyes: Negative.   Respiratory: Negative for cough, chest tightness, shortness of breath and wheezing.   Cardiovascular: Negative for chest pain, palpitations and leg swelling.  Gastrointestinal: Negative for abdominal pain, diarrhea and constipation.  Genitourinary: Positive for vaginal discharge.  Musculoskeletal: Positive for myalgias, arthralgias and neck pain.  Skin: Negative.   Neurological: Negative.   Psychiatric/Behavioral: Negative.       Objective:   Physical Exam  Constitutional: She appears well-developed and well-nourished.  HENT:  Head: Normocephalic and atraumatic.  Eyes: EOM are normal.  Neck: Normal range of motion.  Cardiovascular: Normal rate and regular rhythm.   Pulmonary/Chest: Effort normal and breath sounds normal. No respiratory distress. She has no wheezes.  Abdominal: Soft. Bowel sounds are normal. She exhibits no distension. There is no tenderness.  Musculoskeletal: She exhibits no edema.  No tender spots to palpation  Neurological: Coordination normal.  Skin: Skin is warm and dry.  Psychiatric: She has a normal mood and affect.   Filed Vitals:   11/01/14 1024  BP: 122/72  Pulse: 75  Temp: 98 F  (36.7 C)  TempSrc: Oral  Resp: 14  Height: 5\' 3"  (1.6 m)  Weight: 152 lb 1.9 oz (69.001 kg)  SpO2: 97%      Assessment & Plan:

## 2014-11-09 ENCOUNTER — Telehealth: Payer: Self-pay | Admitting: Internal Medicine

## 2014-11-09 DIAGNOSIS — M79643 Pain in unspecified hand: Secondary | ICD-10-CM

## 2014-11-09 MED ORDER — NAPROXEN 500 MG PO TABS: 500.0000 mg | ORAL_TABLET | Freq: Two times a day (BID) | ORAL | Status: DC

## 2014-11-09 NOTE — Telephone Encounter (Signed)
Patient will try naproxen, and tylenol---patient is also requesting referral to orthopedic specialist----please advise, i will call her, thanks

## 2014-11-09 NOTE — Telephone Encounter (Signed)
Patient is requesting pain medication for her hands and back. She was just in 6/9

## 2014-11-09 NOTE — Addendum Note (Signed)
Addended by: Genella Mech A on: 11/09/2014 05:10 PM   Modules accepted: Orders

## 2014-11-09 NOTE — Telephone Encounter (Signed)
Sent in naproxen, 1 pill BID with meals. Can also use tylenol in between if needed.

## 2014-11-09 NOTE — Telephone Encounter (Addendum)
Referral placed.

## 2014-11-12 NOTE — Telephone Encounter (Signed)
Left message for patient advising referral has been placed

## 2014-11-16 ENCOUNTER — Ambulatory Visit: Payer: BLUE CROSS/BLUE SHIELD | Admitting: Internal Medicine

## 2014-11-22 ENCOUNTER — Encounter: Payer: Self-pay | Admitting: Obstetrics and Gynecology

## 2014-11-29 ENCOUNTER — Other Ambulatory Visit (INDEPENDENT_AMBULATORY_CARE_PROVIDER_SITE_OTHER): Payer: BLUE CROSS/BLUE SHIELD

## 2014-11-29 ENCOUNTER — Telehealth: Payer: Self-pay | Admitting: Obstetrics and Gynecology

## 2014-11-29 DIAGNOSIS — R5383 Other fatigue: Secondary | ICD-10-CM

## 2014-11-29 LAB — CORTISOL: Cortisol, Plasma: 12.9 ug/dL

## 2014-11-29 NOTE — Telephone Encounter (Signed)
Spoke with patient. Scheduled for 12/13/14.

## 2014-11-29 NOTE — Telephone Encounter (Signed)
Spoke with patient regarding benefit for vulvar biopsy. Patient agreeable to benefit and agreed to schedule. Placed call on hold to review with Kennon RoundsSally prior to scheduling. When returned to call line had dropped. Unable to reach patient again and left message to return call for scheduling. Located appointment next week with Dr Edward JollySilva. Per sally schedule patient in a procedure slot with note *3 week follow up with possible biopsy**

## 2014-11-29 NOTE — Telephone Encounter (Signed)
Please try to contact patient again with insurance benefits and schedule.

## 2014-12-04 ENCOUNTER — Encounter: Payer: BLUE CROSS/BLUE SHIELD | Admitting: Gastroenterology

## 2014-12-13 ENCOUNTER — Ambulatory Visit (INDEPENDENT_AMBULATORY_CARE_PROVIDER_SITE_OTHER): Payer: BLUE CROSS/BLUE SHIELD | Admitting: Obstetrics and Gynecology

## 2014-12-13 ENCOUNTER — Encounter: Payer: Self-pay | Admitting: Obstetrics and Gynecology

## 2014-12-13 VITALS — BP 110/76 | HR 88 | Resp 16 | Ht 63.0 in | Wt 149.0 lb

## 2014-12-13 DIAGNOSIS — R1030 Lower abdominal pain, unspecified: Secondary | ICD-10-CM

## 2014-12-13 DIAGNOSIS — R350 Frequency of micturition: Secondary | ICD-10-CM

## 2014-12-13 DIAGNOSIS — A630 Anogenital (venereal) warts: Secondary | ICD-10-CM

## 2014-12-13 DIAGNOSIS — R140 Abdominal distension (gaseous): Secondary | ICD-10-CM | POA: Diagnosis not present

## 2014-12-13 DIAGNOSIS — R312 Other microscopic hematuria: Secondary | ICD-10-CM | POA: Diagnosis not present

## 2014-12-13 DIAGNOSIS — R3129 Other microscopic hematuria: Secondary | ICD-10-CM

## 2014-12-13 LAB — POCT URINALYSIS DIPSTICK
Bilirubin, UA: NEGATIVE
Glucose, UA: NEGATIVE
KETONES UA: NEGATIVE
Leukocytes, UA: NEGATIVE
Nitrite, UA: NEGATIVE
PROTEIN UA: NEGATIVE
UROBILINOGEN UA: NEGATIVE
pH, UA: 5

## 2014-12-13 NOTE — Progress Notes (Signed)
Please refer to the other note opened today.

## 2014-12-13 NOTE — Progress Notes (Signed)
Subjective:     Patient ID: Patricia Hart, female   DOB: May 04, 1961, 54 y.o.   MRN: [id number]  HPI   Patient is here for vulvar/perianal biopsy due to condyloma.  Feels they are getting better but they are still there.  Has had cryotherapy and then 2 TCA treatments.   Pap 09/20/14 ASCUS and HR HPV negative.   Feels that something is not right in her abdomen.  Has suprapubic discomfort and abdominal bloating.  Has colonoscopy/endoscopy/abdominal ultrasound - all normal. Asking for pelvic ultrasound.   MRI of back - herniated discs and arthritis.  Having a steroid inection in her back. Also has fibro myalgia.   Having urinary frequency and urgency.  Urine dip today - moderate RBCs.   Review of Systems    As noted above in the HPI.  Objective:   Physical Exam  Genitourinary:     Procedure - vulvar and perianal biopsies.  Consent for procedure.  Decision to use 3% acetic acid to assist with biopsy site selection.  Extensive salt and pepper acetoewhite change of the perineal and perianal regions.  Some raised condyloma like areas of the perianal region.  Local 1% lidocaine used - Lot number [id number]. 3 mm punch biopsies performed of left perineum and then right perianal region. Tissue to pathology separately.  Simple sutures of 3/0 vicryl to both biopsy sites. Minimal EBL.  No complications.     Assessment:     Perianal condyloma. Status post cryotherapy x 1 and now 2nd TCA treatment.  Biopsies performed today.  Hematuria on urine today.  Abdominal bloating and suprapubic pain.     Plan:     Instructions and precautions given.  Follow up biopsy results.  Follow up appointment in 10 days for a recheck. Will order urine micro and culture.  Order pelvic ultrasound.   ____15___ minutes face to face time of which over 50% was spent in counseling regarding colposcopy findings, hematuria, and abdominal bloating.   After visit summary to patient.

## 2014-12-13 NOTE — Patient Instructions (Signed)

## 2014-12-14 LAB — URINE CULTURE
Colony Count: NO GROWTH
Organism ID, Bacteria: NO GROWTH

## 2014-12-14 LAB — URINALYSIS, MICROSCOPIC ONLY
Bacteria, UA: NONE SEEN
CASTS: NONE SEEN
Crystals: NONE SEEN
Squamous Epithelial / LPF: NONE SEEN

## 2014-12-17 ENCOUNTER — Telehealth: Payer: Self-pay

## 2014-12-17 ENCOUNTER — Encounter: Payer: Self-pay | Admitting: Internal Medicine

## 2014-12-17 ENCOUNTER — Other Ambulatory Visit: Payer: BLUE CROSS/BLUE SHIELD

## 2014-12-17 ENCOUNTER — Ambulatory Visit (INDEPENDENT_AMBULATORY_CARE_PROVIDER_SITE_OTHER): Payer: BLUE CROSS/BLUE SHIELD | Admitting: Internal Medicine

## 2014-12-17 VITALS — BP 110/68 | HR 75 | Temp 98.4°F | Resp 14 | Ht 64.0 in | Wt 152.1 lb

## 2014-12-17 DIAGNOSIS — R140 Abdominal distension (gaseous): Secondary | ICD-10-CM

## 2014-12-17 DIAGNOSIS — M797 Fibromyalgia: Secondary | ICD-10-CM | POA: Diagnosis not present

## 2014-12-17 MED ORDER — PREGABALIN 200 MG PO CAPS: 200.0000 mg | ORAL_CAPSULE | Freq: Two times a day (BID) | ORAL | Status: DC

## 2014-12-17 NOTE — Patient Instructions (Signed)
We will increase the lyrica to 200 mg twice a day and have given you the new prescription.   We are checking you for the gluten allergy today to see if this could be affecting your stomach and the joints.

## 2014-12-17 NOTE — Telephone Encounter (Signed)
-----   Message from Patton Salles, MD sent at 12/16/2014  7:59 PM EDT ----- Please inform patient of negative urine micro and culture.  Vulvar biopsy results pending.   Ultrasound is in precert.

## 2014-12-17 NOTE — Progress Notes (Signed)
Pre visit review using our clinic review tool, if applicable. No additional management support is needed unless otherwise documented below in the visit note. 

## 2014-12-17 NOTE — Telephone Encounter (Signed)
Called patient at [phone] to discuss urine micro and culture, LMOVM to call me back.

## 2014-12-18 LAB — GLIADIN ANTIBODIES, SERUM
GLIADIN IGG: 2 U (ref <20)
Gliadin IgA: 6 Units (ref <20)

## 2014-12-18 LAB — IPS OTHER TISSUE BIOPSY

## 2014-12-18 LAB — RETICULIN ANTIBODIES, IGA W TITER: Reticulin Ab, IgA: NEGATIVE

## 2014-12-18 LAB — TISSUE TRANSGLUTAMINASE, IGA: TISSUE TRANSGLUTAMINASE AB, IGA: 1 U/mL (ref <4)

## 2014-12-18 NOTE — Telephone Encounter (Signed)
Spoke with patient. Advised of message and results as seen below from Dr.Silva. Patient is agreeable and verbalizes understanding.  Routing to provider for final review. Patient agreeable to disposition. Will close encounter.  

## 2014-12-18 NOTE — Progress Notes (Signed)
   Subjective:    Patient ID: Patricia Hart, female    DOB: May 04, 1961, 54 y.o.   MRN: [id number]  HPI The patient is a 54 YO female coming in for follow up of her chronic pain. She wants to be tested for gluten allergy since she has been reading about it and feels she has all the symptoms. She is doing well with lyrica and mobic. She recently got injection in her back which has helped immensely. She is still concerned about why she has all these problems. She is also concerned about chronic lyme disease. Wants to know if we can go up on the lyrica a little more as she is still having some pain.   Review of Systems  Constitutional: Positive for fatigue. Negative for fever, chills, activity change, appetite change and unexpected weight change.  Respiratory: Negative for cough, chest tightness, shortness of breath and wheezing.   Cardiovascular: Negative for chest pain, palpitations and leg swelling.  Gastrointestinal: Negative for abdominal pain, diarrhea and constipation.  Musculoskeletal: Positive for myalgias, arthralgias and neck pain.  Skin: Negative.   Neurological: Negative.   Psychiatric/Behavioral: Negative.       Objective:   Physical Exam  Constitutional: She appears well-developed and well-nourished.  HENT:  Head: Normocephalic and atraumatic.  Eyes: EOM are normal.  Neck: Normal range of motion.  Cardiovascular: Normal rate and regular rhythm.   Pulmonary/Chest: Effort normal and breath sounds normal. No respiratory distress. She has no wheezes.  Abdominal: Soft. Bowel sounds are normal. She exhibits no distension. There is no tenderness.  Musculoskeletal: She exhibits no edema.  No tender spots to palpation  Neurological: Coordination normal.  Skin: Skin is warm and dry.  Psychiatric: She has a normal mood and affect.   Filed Vitals:   12/17/14 1508  BP: 110/68  Pulse: 75  Temp: 98.4 F (36.9 C)  TempSrc: Oral  Resp: 14  Height:  (1.626 m)  Weight: 152 lb  1.9 oz (69.001 kg)  SpO2: 98%      Assessment & Plan:

## 2014-12-18 NOTE — Assessment & Plan Note (Signed)
She is still not accepting of the diagnosis of fibromyalgia despite great success with lyrica. Increase dosing to 200 mg BID. Check celiac panel as some of her crossover symptoms could be present with her GI fullness and multiple joint pains.

## 2014-12-19 ENCOUNTER — Telehealth: Payer: Self-pay

## 2014-12-19 NOTE — Telephone Encounter (Signed)
Called patient at [phone] to discuss biopsy results, LMOVM to call me back.

## 2014-12-19 NOTE — Telephone Encounter (Signed)
-----   Message from Patricia Salles, MD sent at 12/18/2014  7:54 PM EDT ----- Please report pathology results to patient showing condyloma of both biopsy sites. No cancer was seen.  I will see her back for her recheck appointment soon!

## 2014-12-20 NOTE — Telephone Encounter (Signed)
See result note dated 12-19-14.

## 2014-12-28 ENCOUNTER — Encounter: Payer: Self-pay | Admitting: Obstetrics and Gynecology

## 2014-12-28 ENCOUNTER — Ambulatory Visit (INDEPENDENT_AMBULATORY_CARE_PROVIDER_SITE_OTHER): Payer: BLUE CROSS/BLUE SHIELD | Admitting: Obstetrics and Gynecology

## 2014-12-28 VITALS — BP 108/60 | HR 96 | Resp 16 | Ht 64.0 in | Wt 152.0 lb

## 2014-12-28 DIAGNOSIS — T7840XA Allergy, unspecified, initial encounter: Secondary | ICD-10-CM | POA: Diagnosis not present

## 2014-12-28 DIAGNOSIS — A630 Anogenital (venereal) warts: Secondary | ICD-10-CM | POA: Diagnosis not present

## 2014-12-28 MED ORDER — METHYLPREDNISOLONE 4 MG PO TBPK: ORAL_TABLET | ORAL | Status: DC

## 2014-12-28 NOTE — Progress Notes (Signed)
GYNECOLOGY  VISIT   HPI: 54 y.o.   Divorced  Caucasian  female   G1P1001 with No LMP recorded. Patient has had an ablation.   here for   Follow up  - Perianal condyloma acuminatum Had vulvar biopsies which were positive for condyloma - LGSIL. Here for suture removal.   Stung by yellow jackets after stepped on a next.   Arms are swollen and itching.   Still waiting to hear about approval for pelvic ultrasound for bloating.   GYNECOLOGIC HISTORY: No LMP recorded. Patient has had an ablation. Contraception: Ablation Menopausal hormone therapy: None Last mammogram: 2004 BIRADS1:neg Last pap smear: 09/20/14 ASCUS. HR HPV:neg        OB History    Gravida Para Term Preterm AB TAB SAB Ectopic Multiple Living   1 1 1       1          Patient Active Problem List   Diagnosis Date Noted  . Yeast infection 11/02/2014  . Fibromyalgia 08/05/2014  . Routine general medical examination at a health care facility 08/05/2014    Past Medical History  Diagnosis Date  . Blood transfusion without reported diagnosis 1997  . Clotting disorder 1997    pregnancy induced dvt- no problems since then  . Overactive bladder   . History of DVT (deep vein thrombosis)     during pregnancy  . History of pulmonary embolism     occurred during pregnancy    Past Surgical History  Procedure Laterality Date  . Ablation  2006  . Breast implants      saline    Current Outpatient Prescriptions  Medication Sig Dispense Refill  . meloxicam (MOBIC) 15 MG tablet Take 1 tablet by mouth daily.  0  . pregabalin (LYRICA) 200 MG capsule Take 1 capsule (200 mg total) by mouth 2 (two) times daily. 60 capsule 2  . LYRICA 150 MG capsule   2   No current facility-administered medications for this visit.     ALLERGIES: Latex  Family History  Problem Relation Age of Onset  . Hyperlipidemia Mother   . Cancer Mother     colon; lung  . Diabetes Father   . Hyperlipidemia Father   . Hypertension Father   .  Breast cancer Maternal Aunt     50's  . Osteoporosis Mother     History   Social History  . Marital Status: Divorced    Spouse Name: N/A  . Number of Children: N/A  . Years of Education: N/A   Occupational History  . Not on file.   Social History Main Topics  . Smoking status: Never Smoker   . Smokeless tobacco: Not on file  . Alcohol Use: 1.2 oz/week    2 Standard drinks or equivalent per week     Comment: 0-4 drinks/wk  . Drug Use: No  . Sexual Activity:    Partners: Male    Birth Control/ Protection: Surgical     Comment: Ablation   Other Topics Concern  . Not on file   Social History Narrative    ROS:  Pertinent items are noted in HPI.  PHYSICAL EXAMINATION:    BP 108/60 mmHg  Pulse 96  Resp 16  Ht 5\' 4"  (1.626 m)  Wt 152 lb (68.947 kg)  BMI 26.08 kg/m2    General appearance: alert, cooperative and appears stated age Head: Normocephalic, without obvious abnormality, atraumatic Skin: Skin color, texture, turgor normal. Bilateral arms with patches of swelling in  subQ - measuring 5 cm each, erythema.    Pelvic: External genitalia:  Suture present of the left perineum.                                          Anus:   Sutures present of the right perianal region.  Still has a small condyloma present near this area measuring approx. 3 mm.  All sutures (vicryl) removed without difficulty.   Chaperone was present for exam.  ASSESSMENT  Condyloma of the external genitalia, confirmed on biopsy.  Abdominal bloating.  Status post multiple yellow jacket stings.  PLAN  Counseled regarding condyloma. Will treat any remaining areas at follow up visit for pelvic ultrasound.  Medrol dose pack.  Return for pelvic ultrasound.   An After Visit Summary was printed and given to the patient.  ___15___ minutes face to face time of which over 50% was spent in counseling.

## 2014-12-31 ENCOUNTER — Telehealth: Payer: Self-pay | Admitting: Obstetrics and Gynecology

## 2014-12-31 NOTE — Telephone Encounter (Signed)
Attempted call to patient to review benefit and schedule. Left voicemail to return call. Should patient call while referrals are unavailable, ok to schedule with Dr Edward Jolly. Separate staff message with benefit information sent to triage and Kennon Rounds.

## 2015-01-04 NOTE — Telephone Encounter (Signed)
Please disregard made in error °

## 2015-01-04 NOTE — Telephone Encounter (Signed)
Routing to provider for final review. Patient agreeable to disposition. Will close encounter.     

## 2015-01-04 NOTE — Telephone Encounter (Signed)
Spoke with Ms Castille. Reviewed benefit information. Patient understood and agreeable. Patient scheduled for 01/10/15.

## 2015-01-10 ENCOUNTER — Encounter: Payer: BLUE CROSS/BLUE SHIELD | Admitting: Obstetrics and Gynecology

## 2015-01-10 ENCOUNTER — Ambulatory Visit: Payer: BLUE CROSS/BLUE SHIELD

## 2015-01-10 ENCOUNTER — Telehealth: Payer: Self-pay | Admitting: Obstetrics and Gynecology

## 2015-01-10 ENCOUNTER — Other Ambulatory Visit: Payer: BLUE CROSS/BLUE SHIELD

## 2015-01-10 NOTE — Telephone Encounter (Signed)
Encounter closed

## 2015-01-10 NOTE — Telephone Encounter (Signed)
Patient arrived past appointment time for her ultrasound. Per provider ok to reschedule. Patient rescheduled for 01/17/15  with Dr Edward Jolly. Patient understood arrival time and agreeable to reschedule prior to leaving the office.   Routing to [place] and Dr Edward Jolly for review prior to closing.

## 2015-01-10 NOTE — Telephone Encounter (Signed)
Thank you for the update!

## 2015-01-13 NOTE — Progress Notes (Signed)
Erroneous encounter.  Patient not seen for an office visit on this day.

## 2015-01-17 ENCOUNTER — Encounter: Payer: Self-pay | Admitting: Obstetrics and Gynecology

## 2015-01-17 ENCOUNTER — Ambulatory Visit (INDEPENDENT_AMBULATORY_CARE_PROVIDER_SITE_OTHER): Payer: BLUE CROSS/BLUE SHIELD | Admitting: Obstetrics and Gynecology

## 2015-01-17 ENCOUNTER — Ambulatory Visit (INDEPENDENT_AMBULATORY_CARE_PROVIDER_SITE_OTHER): Payer: BLUE CROSS/BLUE SHIELD

## 2015-01-17 VITALS — BP 108/80 | HR 68 | Wt 150.0 lb

## 2015-01-17 DIAGNOSIS — R140 Abdominal distension (gaseous): Secondary | ICD-10-CM | POA: Diagnosis not present

## 2015-01-17 DIAGNOSIS — A630 Anogenital (venereal) warts: Secondary | ICD-10-CM

## 2015-01-17 DIAGNOSIS — R1030 Lower abdominal pain, unspecified: Secondary | ICD-10-CM

## 2015-01-17 MED ORDER — IMIQUIMOD 5 % EX CREA: TOPICAL_CREAM | CUTANEOUS | Status: DC

## 2015-01-17 NOTE — Progress Notes (Signed)
Subjective  55 y.o. G40P1001 Caucasian female here for pelvic ultrasound for  Abdominal bloating.   Also here for condyloma treatment of the perianal region for any remaining condyloma. Has had biopsy confirming above.  Wants treatment and Aldara Rx.   Objective  Pelvic ultrasound images and report reviewed with patient.  Uterus - no masses. EMS - 1.57. Ovaries - normal. Free fluid - no       Pelvic exam - normal external genitalia and urethra.  Perianal region - two 3 - 4 mm condyloma at 8:00 and 10:00.  Each treated with TCA 80%.  Good blanching of the tissue.   Assessment  Abdominal bloating.  Normal pelvic ultrasound. Perianal condyloma.  Plan  Reassurance given regarding pelvic anatomy.  Continue care with gastroenterology.  Rx for Aldara.  See orders. Instructed in use.  Follow up for annual exam and prn.   ___15____ minutes face to face time of which over 50% was spent in counseling.

## 2015-02-01 ENCOUNTER — Ambulatory Visit: Payer: BLUE CROSS/BLUE SHIELD | Admitting: Internal Medicine

## 2015-03-11 NOTE — Telephone Encounter (Signed)
error 

## 2015-03-21 ENCOUNTER — Other Ambulatory Visit: Payer: Self-pay | Admitting: Internal Medicine

## 2015-04-04 ENCOUNTER — Ambulatory Visit (INDEPENDENT_AMBULATORY_CARE_PROVIDER_SITE_OTHER): Payer: BLUE CROSS/BLUE SHIELD | Admitting: Internal Medicine

## 2015-04-04 ENCOUNTER — Encounter: Payer: Self-pay | Admitting: Internal Medicine

## 2015-04-04 VITALS — BP 116/78 | HR 83 | Temp 98.4°F | Resp 16 | Ht 63.5 in | Wt 149.0 lb

## 2015-04-04 DIAGNOSIS — M797 Fibromyalgia: Secondary | ICD-10-CM | POA: Diagnosis not present

## 2015-04-04 DIAGNOSIS — Z23 Encounter for immunization: Secondary | ICD-10-CM | POA: Diagnosis not present

## 2015-04-04 MED ORDER — HYDROCODONE-ACETAMINOPHEN 5-325 MG PO TABS: 1.0000 | ORAL_TABLET | Freq: Four times a day (QID) | ORAL | Status: DC | PRN

## 2015-04-04 NOTE — Patient Instructions (Signed)
We will get the labs from Dr. Cleophas DunkerBassett to see what she finds.   We have given you the flu shot and the vicodin.   Keep working with your back and think about asking about a nerve block for the back if the steroid shots do not last.

## 2015-04-04 NOTE — Progress Notes (Signed)
Pre visit review using our clinic review tool, if applicable. No additional management support is needed unless otherwise documented below in the visit note. 

## 2015-04-04 NOTE — Assessment & Plan Note (Signed)
At this time flared. Talked to her about the course of lyme disease and talked with her about the test needed to determine old infection from chronic disease and possible treatments if positive. Will get results from Dr. Cleophas DunkerBassett and treat as appropriate. Rx for vicodin #30 for severe pain to increase function and mobility.

## 2015-04-04 NOTE — Progress Notes (Signed)
   Subjective:    Patient ID: Patricia BonitoLaura Hart, female    DOB: 1961-04-22, 54 y.o.   MRN: [id number]  HPI The patient is a 54 YO female coming in for follow up of her chronic pain and fatigue. She has been taking lyrica still with good success. She is not sure that she has fibromyalgia. Doing okay for now. Was concerned previously that she had gluten allergy but this was negative. No other new symptoms since last visit. No drowsiness with the lyrica. Another doctor has recently tested her for many more things including RA, lyme disease but the results are not available yet.  Review of Systems  Constitutional: Positive for fatigue. Negative for fever, chills, activity change, appetite change and unexpected weight change.  Respiratory: Negative for cough, chest tightness, shortness of breath and wheezing.   Cardiovascular: Negative for chest pain, palpitations and leg swelling.  Gastrointestinal: Negative for abdominal pain, diarrhea and constipation.  Musculoskeletal: Positive for myalgias and arthralgias. Negative for neck pain.  Skin: Negative.   Neurological: Negative.   Psychiatric/Behavioral: Negative.       Objective:   Physical Exam  Constitutional: She appears well-developed and well-nourished.  HENT:  Head: Normocephalic and atraumatic.  Eyes: EOM are normal.  Neck: Normal range of motion.  Cardiovascular: Normal rate and regular rhythm.   Pulmonary/Chest: Effort normal and breath sounds normal. No respiratory distress. She has no wheezes.  Abdominal: Soft. Bowel sounds are normal. She exhibits no distension. There is no tenderness.  Musculoskeletal: She exhibits no edema.  No tender spots to palpation  Neurological: Coordination normal.  Skin: Skin is warm and dry.  Psychiatric: She has a normal mood and affect.    Filed Vitals:   04/04/15 1343  BP: 116/78  Pulse: 83  Temp: 98.4 F (36.9 C)  TempSrc: Oral  Resp: 16  Height: 5' 3.5" (1.613 m)  Weight: 149 lb (67.586  kg)  SpO2: 93%      Assessment & Plan:  Flu shot given at visit.

## 2015-04-11 ENCOUNTER — Ambulatory Visit: Payer: BLUE CROSS/BLUE SHIELD | Admitting: Internal Medicine

## 2015-04-17 ENCOUNTER — Other Ambulatory Visit: Payer: Self-pay | Admitting: Orthopedic Surgery

## 2015-04-22 ENCOUNTER — Encounter (HOSPITAL_BASED_OUTPATIENT_CLINIC_OR_DEPARTMENT_OTHER): Payer: Self-pay | Admitting: *Deleted

## 2015-04-23 ENCOUNTER — Ambulatory Visit (HOSPITAL_BASED_OUTPATIENT_CLINIC_OR_DEPARTMENT_OTHER)
Admission: RE | Admit: 2015-04-23 | Discharge: 2015-04-23 | Disposition: A | Discharge status: Home / Self Care | Payer: BLUE CROSS/BLUE SHIELD | Source: Ambulatory Visit | Attending: Orthopedic Surgery | Admitting: Orthopedic Surgery

## 2015-04-23 ENCOUNTER — Ambulatory Visit (HOSPITAL_BASED_OUTPATIENT_CLINIC_OR_DEPARTMENT_OTHER): Payer: BLUE CROSS/BLUE SHIELD | Admitting: Anesthesiology

## 2015-04-23 ENCOUNTER — Encounter (HOSPITAL_BASED_OUTPATIENT_CLINIC_OR_DEPARTMENT_OTHER): Payer: Self-pay | Admitting: Orthopedic Surgery

## 2015-04-23 ENCOUNTER — Encounter (HOSPITAL_BASED_OUTPATIENT_CLINIC_OR_DEPARTMENT_OTHER)
Admission: RE | Disposition: A | Discharge status: Home / Self Care | Payer: Self-pay | Source: Ambulatory Visit | Attending: Orthopedic Surgery

## 2015-04-23 DIAGNOSIS — K219 Gastro-esophageal reflux disease without esophagitis: Secondary | ICD-10-CM | POA: Insufficient documentation

## 2015-04-23 DIAGNOSIS — Z86718 Personal history of other venous thrombosis and embolism: Secondary | ICD-10-CM | POA: Diagnosis not present

## 2015-04-23 DIAGNOSIS — Z79899 Other long term (current) drug therapy: Secondary | ICD-10-CM | POA: Diagnosis not present

## 2015-04-23 DIAGNOSIS — G5601 Carpal tunnel syndrome, right upper limb: Secondary | ICD-10-CM | POA: Diagnosis present

## 2015-04-23 DIAGNOSIS — M65841 Other synovitis and tenosynovitis, right hand: Secondary | ICD-10-CM | POA: Diagnosis not present

## 2015-04-23 DIAGNOSIS — Z91040 Latex allergy status: Secondary | ICD-10-CM | POA: Insufficient documentation

## 2015-04-23 DIAGNOSIS — M65311 Trigger thumb, right thumb: Secondary | ICD-10-CM | POA: Insufficient documentation

## 2015-04-23 DIAGNOSIS — Z86711 Personal history of pulmonary embolism: Secondary | ICD-10-CM | POA: Insufficient documentation

## 2015-04-23 HISTORY — PX: TRIGGER FINGER RELEASE: SHX641

## 2015-04-23 HISTORY — DX: Gastro-esophageal reflux disease without esophagitis: K21.9

## 2015-04-23 HISTORY — PX: CARPAL TUNNEL RELEASE: SHX101

## 2015-04-23 SURGERY — CARPAL TUNNEL RELEASE
Anesthesia: Monitor Anesthesia Care | Site: Hand | Laterality: Right

## 2015-04-23 MED ORDER — MIDAZOLAM HCL 2 MG/2ML IJ SOLN
1.0000 mg | INTRAMUSCULAR | Status: DC | PRN
  Administered 2015-04-23: 2 mg via INTRAVENOUS

## 2015-04-23 MED ORDER — MIDAZOLAM HCL 2 MG/2ML IJ SOLN
INTRAMUSCULAR | Status: AC
  Filled 2015-04-23: qty 2

## 2015-04-23 MED ORDER — FENTANYL CITRATE (PF) 100 MCG/2ML IJ SOLN
INTRAMUSCULAR | Status: AC
  Filled 2015-04-23: qty 2

## 2015-04-23 MED ORDER — LIDOCAINE HCL (CARDIAC) 20 MG/ML IV SOLN
INTRAVENOUS | Status: AC
  Filled 2015-04-23: qty 5

## 2015-04-23 MED ORDER — CEFAZOLIN SODIUM-DEXTROSE 2-3 GM-% IV SOLR: 2.0000 g | INTRAVENOUS | Status: DC

## 2015-04-23 MED ORDER — CHLORHEXIDINE GLUCONATE 4 % EX LIQD
60.0000 mL | Freq: Once | CUTANEOUS | Status: DC
Start: 2015-04-23 — End: 2015-04-23

## 2015-04-23 MED ORDER — MEPERIDINE HCL 25 MG/ML IJ SOLN: 6.2500 mg | INTRAMUSCULAR | Status: DC | PRN

## 2015-04-23 MED ORDER — LIDOCAINE HCL (PF) 0.5 % IJ SOLN
INTRAMUSCULAR | Status: DC | PRN
  Administered 2015-04-23: 30 mL via INTRAVENOUS

## 2015-04-23 MED ORDER — CHLORHEXIDINE GLUCONATE 4 % EX LIQD: 60.0000 mL | Freq: Once | CUTANEOUS | Status: DC

## 2015-04-23 MED ORDER — ONDANSETRON HCL 4 MG/2ML IJ SOLN
INTRAMUSCULAR | Status: DC | PRN
  Administered 2015-04-23: 4 mg via INTRAVENOUS

## 2015-04-23 MED ORDER — ONDANSETRON HCL 4 MG/2ML IJ SOLN
INTRAMUSCULAR | Status: AC
  Filled 2015-04-23: qty 2

## 2015-04-23 MED ORDER — LIDOCAINE HCL (CARDIAC) 20 MG/ML IV SOLN
INTRAVENOUS | Status: DC | PRN
  Administered 2015-04-23: 30 mg via INTRAVENOUS

## 2015-04-23 MED ORDER — SCOPOLAMINE 1 MG/3DAYS TD PT72: 1.0000 | MEDICATED_PATCH | Freq: Once | TRANSDERMAL | Status: DC | PRN

## 2015-04-23 MED ORDER — BUPIVACAINE HCL (PF) 0.25 % IJ SOLN
INTRAMUSCULAR | Status: DC | PRN
  Administered 2015-04-23: 9 mL

## 2015-04-23 MED ORDER — LACTATED RINGERS IV SOLN
INTRAVENOUS | Status: DC
  Administered 2015-04-23: 11:00:00 via INTRAVENOUS

## 2015-04-23 MED ORDER — CEFAZOLIN SODIUM-DEXTROSE 2-3 GM-% IV SOLR
2.0000 g | INTRAVENOUS | Status: DC
Start: 2015-04-23 — End: 2015-04-23

## 2015-04-23 MED ORDER — PROPOFOL 10 MG/ML IV BOLUS
INTRAVENOUS | Status: AC
  Filled 2015-04-23: qty 20

## 2015-04-23 MED ORDER — DEXAMETHASONE SODIUM PHOSPHATE 10 MG/ML IJ SOLN
INTRAMUSCULAR | Status: AC
  Filled 2015-04-23: qty 1

## 2015-04-23 MED ORDER — PROPOFOL 10 MG/ML IV BOLUS
INTRAVENOUS | Status: DC | PRN
  Administered 2015-04-23: 10 mg via INTRAVENOUS
  Administered 2015-04-23 (×2): 20 mg via INTRAVENOUS

## 2015-04-23 MED ORDER — OXYCODONE HCL 5 MG PO TABS
5.0000 mg | ORAL_TABLET | ORAL | Status: DC | PRN
  Administered 2015-04-23: 5 mg via ORAL

## 2015-04-23 MED ORDER — FENTANYL CITRATE (PF) 100 MCG/2ML IJ SOLN
50.0000 ug | INTRAMUSCULAR | Status: DC | PRN
  Administered 2015-04-23: 50 ug via INTRAVENOUS

## 2015-04-23 MED ORDER — 0.9 % SODIUM CHLORIDE (POUR BTL) OPTIME
TOPICAL | Status: DC | PRN
  Administered 2015-04-23: 100 mL

## 2015-04-23 MED ORDER — FENTANYL CITRATE (PF) 100 MCG/2ML IJ SOLN
25.0000 ug | INTRAMUSCULAR | Status: DC | PRN
  Administered 2015-04-23: 50 ug via INTRAVENOUS

## 2015-04-23 MED ORDER — OXYCODONE HCL 5 MG PO TABS
ORAL_TABLET | ORAL | Status: AC
  Filled 2015-04-23: qty 1

## 2015-04-23 MED ORDER — GLYCOPYRROLATE 0.2 MG/ML IJ SOLN: 0.2000 mg | Freq: Once | INTRAMUSCULAR | Status: DC | PRN

## 2015-04-23 MED ORDER — BUPIVACAINE HCL (PF) 0.25 % IJ SOLN
INTRAMUSCULAR | Status: AC
  Filled 2015-04-23: qty 60

## 2015-04-23 MED ORDER — HYDROCODONE-ACETAMINOPHEN 5-325 MG PO TABS: 1.0000 | ORAL_TABLET | Freq: Four times a day (QID) | ORAL | Status: DC | PRN

## 2015-04-23 MED ORDER — CEFAZOLIN SODIUM-DEXTROSE 2-3 GM-% IV SOLR
INTRAVENOUS | Status: AC
  Filled 2015-04-23: qty 50

## 2015-04-23 SURGICAL SUPPLY — 36 items
BANDAGE COBAN STERILE 2 (GAUZE/BANDAGES/DRESSINGS) ×2 IMPLANT
BLADE SURG 15 STRL LF DISP TIS (BLADE) ×1 IMPLANT
BLADE SURG 15 STRL SS (BLADE) ×2
BNDG CMPR 9X4 STRL LF SNTH (GAUZE/BANDAGES/DRESSINGS)
BNDG COHESIVE 3X5 TAN STRL LF (GAUZE/BANDAGES/DRESSINGS) ×2 IMPLANT
BNDG ESMARK 4X9 LF (GAUZE/BANDAGES/DRESSINGS) IMPLANT
BNDG GAUZE ELAST 4 BULKY (GAUZE/BANDAGES/DRESSINGS) ×2 IMPLANT
CHLORAPREP W/TINT 26ML (MISCELLANEOUS) ×2 IMPLANT
CORDS BIPOLAR (ELECTRODE) ×2 IMPLANT
COVER BACK TABLE 60X90IN (DRAPES) ×2 IMPLANT
COVER MAYO STAND STRL (DRAPES) ×2 IMPLANT
CUFF TOURNIQUET SINGLE 18IN (TOURNIQUET CUFF) ×2 IMPLANT
DRAPE EXTREMITY T 121X128X90 (DRAPE) ×2 IMPLANT
DRAPE SURG 17X23 STRL (DRAPES) ×2 IMPLANT
DRSG PAD ABDOMINAL 8X10 ST (GAUZE/BANDAGES/DRESSINGS) ×2 IMPLANT
GAUZE SPONGE 4X4 12PLY STRL (GAUZE/BANDAGES/DRESSINGS) ×2 IMPLANT
GAUZE XEROFORM 1X8 LF (GAUZE/BANDAGES/DRESSINGS) ×2 IMPLANT
GLOVE BIOGEL PI IND STRL 7.0 (GLOVE) IMPLANT
GLOVE BIOGEL PI IND STRL 8.5 (GLOVE) ×1 IMPLANT
GLOVE BIOGEL PI INDICATOR 7.0 (GLOVE) ×2
GLOVE BIOGEL PI INDICATOR 8.5 (GLOVE) ×1
GLOVE SURG SS PI 6.5 STRL IVOR (GLOVE) ×1 IMPLANT
GLOVE SURG SS PI 8.0 STRL IVOR (GLOVE) ×1 IMPLANT
GOWN STRL REUS W/ TWL LRG LVL3 (GOWN DISPOSABLE) ×1 IMPLANT
GOWN STRL REUS W/TWL LRG LVL3 (GOWN DISPOSABLE) ×2
GOWN STRL REUS W/TWL XL LVL3 (GOWN DISPOSABLE) ×2 IMPLANT
NDL PRECISIONGLIDE 27X1.5 (NEEDLE) ×1 IMPLANT
NEEDLE PRECISIONGLIDE 27X1.5 (NEEDLE) ×2 IMPLANT
NS IRRIG 1000ML POUR BTL (IV SOLUTION) ×2 IMPLANT
PACK BASIN DAY SURGERY FS (CUSTOM PROCEDURE TRAY) ×2 IMPLANT
STOCKINETTE 4X48 STRL (DRAPES) ×2 IMPLANT
SUT ETHILON 4 0 PS 2 18 (SUTURE) ×3 IMPLANT
SUT VICRYL 4-0 PS2 18IN ABS (SUTURE) IMPLANT
SYR BULB 3OZ (MISCELLANEOUS) ×2 IMPLANT
SYR CONTROL 10ML LL (SYRINGE) ×2 IMPLANT
TOWEL OR 17X24 6PK STRL BLUE (TOWEL DISPOSABLE) ×3 IMPLANT

## 2015-04-23 NOTE — Transfer of Care (Signed)
Immediate Anesthesia Transfer of Care Note  Patient: Patricia Hart  Procedure(s) Performed: Procedure(s): RIGHT CARPAL TUNNEL RELEASE (Right) RELEASE TRIGGER FINGER/A-1 PULLEY RIGHT THUMB (Right)  Patient Location: PACU  Anesthesia Type:Bier block  Level of Consciousness: awake, sedated and patient cooperative  Airway & Oxygen Therapy: Patient Spontanous Breathing and Patient connected to face mask oxygen  Post-op Assessment: Report given to RN and Post -op Vital signs reviewed and stable  Post vital signs: Reviewed and stable  Last Vitals:  Filed Vitals:   04/23/15 1032  BP: 122/81  Pulse: 70  Temp: 36.6 C  Resp: 16    Complications: No apparent anesthesia complications

## 2015-04-23 NOTE — Discharge Instructions (Addendum)

## 2015-04-23 NOTE — Anesthesia Postprocedure Evaluation (Signed)
Anesthesia Post Note  Patient: Patricia Hart  Procedure(s) Performed: Procedure(s) (LRB): RIGHT CARPAL TUNNEL RELEASE (Right) RELEASE TRIGGER FINGER/Hart-1 PULLEY RIGHT THUMB (Right)  Patient location during evaluation: PACU Anesthesia Type: General Level of consciousness: awake and alert Pain management: pain level controlled Vital Signs Assessment: post-procedure vital signs reviewed and stable Respiratory status: spontaneous breathing, nonlabored ventilation and respiratory function stable Cardiovascular status: blood pressure returned to baseline and stable Postop Assessment: no signs of nausea or vomiting Anesthetic complications: no    Last Vitals:  Filed Vitals:   04/23/15 1215 04/23/15 1245  BP: 99/72 110/75  Pulse: 65 67  Temp:  36.4 C  Resp: 12 20    Last Pain:  Filed Vitals:   04/23/15 1310  PainSc: 2                  Patricia Hart

## 2015-04-23 NOTE — Op Note (Signed)
Dictation Number [id number]

## 2015-04-23 NOTE — Anesthesia Procedure Notes (Addendum)
Anesthesia Regional Block:  Bier block (IV Regional)  Pre-Anesthetic Checklist: ,, timeout performed, Correct Patient, Correct Site, Correct Laterality, Correct Procedure,, site marked, surgical consent,, at surgeon's request Needles:  Injection technique: Single-shot  Needle Type: Other      Needle Gauge: 20 and 20 G    Additional Needles: Bier block (IV Regional) Narrative:   Performed by: Personally    Procedure Name: MAC Date/Time: 04/23/2015 11:26 AM Performed by: Estephan Gallardo D Pre-anesthesia Checklist: Patient identified, Emergency Drugs available, Suction available, Patient being monitored and Timeout performed Patient Re-evaluated:Patient Re-evaluated prior to inductionOxygen Delivery Method: Simple face mask Preoxygenation: Pre-oxygenation with 100% oxygen

## 2015-04-23 NOTE — Anesthesia Preprocedure Evaluation (Signed)
Anesthesia Evaluation  Patient identified by MRN, date of birth, ID band Patient awake    Reviewed: Allergy & Precautions, NPO status , Patient's Chart, lab work & pertinent test results  Airway Mallampati: I  TM Distance: >3 FB Neck ROM: Full    Dental  (+) Teeth Intact, Dental Advisory Given   Pulmonary  breath sounds clear to auscultation        Cardiovascular Rhythm:Regular Rate:Normal     Neuro/Psych    GI/Hepatic GERD-  Medicated and Controlled,  Endo/Other    Renal/GU      Musculoskeletal   Abdominal   Peds  Hematology   Anesthesia Other Findings   Reproductive/Obstetrics                             Anesthesia Physical Anesthesia Plan  ASA: I  Anesthesia Plan: MAC and Bier Block   Post-op Pain Management:    Induction: Intravenous  Airway Management Planned: Simple Face Mask  Additional Equipment:   Intra-op Plan:   Post-operative Plan:   Informed Consent: I have reviewed the patients History and Physical, chart, labs and discussed the procedure including the risks, benefits and alternatives for the proposed anesthesia with the patient or authorized representative who has indicated his/her understanding and acceptance.   Dental advisory given  Plan Discussed with: CRNA, Anesthesiologist and Surgeon  Anesthesia Plan Comments:         Anesthesia Quick Evaluation  

## 2015-04-23 NOTE — H&P (Deleted)
  Patricia Hart is a 54 yo female with bilateral hand pain. She has numbness and tingling in all fingers. This has been present for 10 years. She has had injections to the carpal canals with temporary relief. She has triggering of the left thumb. This has also been injected. Nerve conduction testing is positive for carpal tunnel syndrome bilaterally.She has had wrist splints to wear at night. She has been taking Mobic and Lyrica. Patricia Hart is an 54 y.o. female.   Chief Complaint: numbness hands and catching left thumb HPI: see above  Past Medical History  Diagnosis Date  . Blood transfusion without reported diagnosis 1997  . Clotting disorder (HCC) 1997    pregnancy induced dvt- no problems since then  . Overactive bladder   . History of DVT (deep vein thrombosis)     during pregnancy  . History of pulmonary embolism     occurred during pregnancy  . GERD (gastroesophageal reflux disease)     Past Surgical History  Procedure Laterality Date  . Ablation  2006    endometrial  . Breast implants      saline    Family History  Problem Relation Age of Onset  . Hyperlipidemia Mother   . Cancer Mother     colon; lung  . Diabetes Father   . Hyperlipidemia Father   . Hypertension Father   . Breast cancer Maternal Aunt     50's  . Osteoporosis Mother    Social History:  reports that she has never smoked. She does not have any smokeless tobacco history on file. She reports that she drinks about 1.2 oz of alcohol per week. She reports that she does not use illicit drugs.  Allergies:  Allergies  Allergen Reactions  . Latex Other (See Comments)    Skin irritation    Medications Prior to Admission  Medication Sig Dispense Refill  . HYDROcodone-acetaminophen (NORCO/VICODIN) 5-325 MG tablet Take 1 tablet by mouth every 6 (six) hours as needed for moderate pain. 30 tablet 0  . LYRICA 200 MG capsule TAKE 1 CAPSULE TWICE A DAY 60 capsule 3  . meloxicam (MOBIC) 15 MG tablet Take 1 tablet by  mouth daily.  0  . omeprazole (PRILOSEC) 20 MG capsule Take 20 mg by mouth daily.    Marland Kitchen. tiZANidine (ZANAFLEX) 4 MG tablet Take 4 mg by mouth daily.   0    No results found for this or any previous visit (from the past 48 hour(s)).  No results found.   Pertinent items are noted in HPI.  Height 5' 3.5" (1.613 m), weight 67.132 kg (148 lb).  General appearance: alert, cooperative and appears stated age Head: Normocephalic, without obvious abnormality Neck: no JVD Resp: clear to auscultation bilaterally Cardio: regular rate and rhythm, S1, S2 normal, no murmur, click, rub or gallop GI: soft, non-tender; bowel sounds normal; no masses,  no organomegaly Extremities: numbness hands and catching left thumb Pulses: 2+ and symmetric Skin: Skin color, texture, turgor normal. No rashes or lesions Neurologic: Grossly normal Incision/Wound: na  Assessment/Plan Carpal tunnel and trigger thumb left hand Plan: She is aware that there is no guarantee with the surgery, the possibility of infection, recurrence, injury to arteries, nerves, tendons, incomplete relief of symptoms and dystrophy.  She is scheduled for  carpal tunnel release, left hand and release trigger left thumb as an outpatient under regional anesthesia  Patricia Hart R 04/23/2015, 10:31 AM

## 2015-04-23 NOTE — H&P (Signed)
  Patricia Hart is a 54 year old female with bilateral trigger thumbs and bilateral carpal tunnel syndrome. Her NCV's are positive for carpal tunnel syndrome. She has failed conservative treat ment including splinting, NSAIDs and injections. [facility] on the right thumb and carpal tunnel release right hand. Patricia Hart is an 54 y.o. female.   Chief Complaint: numbness bilateral hands and catching bilateral thumbs HPI: see above  Past Medical History  Diagnosis Date  . Blood transfusion without reported diagnosis 1997  . Clotting disorder (HCC) 1997    pregnancy induced dvt- no problems since then  . Overactive bladder   . History of DVT (deep vein thrombosis)     during pregnancy  . History of pulmonary embolism     occurred during pregnancy  . GERD (gastroesophageal reflux disease)     Past Surgical History  Procedure Laterality Date  . Ablation  2006    endometrial  . Breast implants      saline    Family History  Problem Relation Age of Onset  . Hyperlipidemia Mother   . Cancer Mother     colon; lung  . Diabetes Father   . Hyperlipidemia Father   . Hypertension Father   . Breast cancer Maternal Aunt     50's  . Osteoporosis Mother    Social History:  reports that she has never smoked. She does not have any smokeless tobacco history on file. She reports that she drinks about 1.2 oz of alcohol per week. She reports that she does not use illicit drugs.  Allergies:  Allergies  Allergen Reactions  . Latex Other (See Comments)    Skin irritation    Medications Prior to Admission  Medication Sig Dispense Refill  . HYDROcodone-acetaminophen (NORCO/VICODIN) 5-325 MG tablet Take 1 tablet by mouth every 6 (six) hours as needed for moderate pain. 30 tablet 0  . LYRICA 200 MG capsule TAKE 1 CAPSULE TWICE A DAY 60 capsule 3  . meloxicam (MOBIC) 15 MG tablet Take 1 tablet by mouth daily.  0  . omeprazole (PRILOSEC) 20 MG capsule Take 20  mg by mouth daily.    Marland Kitchen. tiZANidine (ZANAFLEX) 4 MG tablet Take 4 mg by mouth daily.   0    No results found for this or any previous visit (from the past 48 hour(s)).  No results found.   Pertinent items are noted in HPI.  Blood pressure 122/81, pulse 70, temperature 97.8 F (36.6 C), temperature source Oral, resp. rate 16, height 5\' 3"  (1.6 m), weight 67.132 kg (148 lb), SpO2 97 %.  General appearance: alert, cooperative and appears stated age Head: Normocephalic, without obvious abnormality Neck: no JVD Resp: clear to auscultation bilaterally Cardio: regular rate and rhythm, S1, S2 normal, no murmur, click, rub or gallop GI: soft, non-tender; bowel sounds normal; no masses,  no organomegaly Extremities: numbness right hand catching right thumb Pulses: 2+ and symmetric Skin: Skin color, texture, turgor normal. No rashes or lesions Neurologic: Grossly normal Incision/Wound: na  Assessment/Plan Carpal tunnel right hand and trigger right thumb  She is aware that there is no guarantee with the surgery, the possibility of infection, recurrence, injury to arteries, nerves, tendons, incomplete relief of symptoms and dystrophy.  She is scheduled for  carpal tunnel release, right hand and release trigger right thumb as an outpatient under regional anesthesia.  Paulmichael Schreck R 04/23/2015, 10:43 AM

## 2015-04-23 NOTE — Brief Op Note (Signed)
04/23/2015  11:46 AM  PATIENT:  Patricia Hart  54 y.o. 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  PROCEDURE:  Procedure(s): RIGHT CARPAL TUNNEL RELEASE (Right) RELEASE TRIGGER FINGER/A-1 PULLEY RIGHT THUMB (Right)  SURGEON:  Surgeon(s) and Role:    * Cindee SaltGary Zaahir Pickney, MD - Primary  PHYSICIAN ASSISTANT:   ASSISTANTS: none   ANESTHESIA:   local and regional  EBL:  Total I/O In: 800 [I.V.:800] Out: -   BLOOD ADMINISTERED:none  DRAINS: none   LOCAL MEDICATIONS USED:  BUPIVICAINE   SPECIMEN:  No Specimen  DISPOSITION OF SPECIMEN:  N/A  COUNTS:  YES  TOURNIQUET:   Total Tourniquet Time Documented: Forearm (Right) - 21 minutes Total: Forearm (Right) - 21 minutes   DICTATION: .Other Dictation: Dictation Number [id number]  PLAN OF CARE: Discharge to home after PACU  PATIENT DISPOSITION:  PACU - hemodynamically stable.

## 2015-04-24 ENCOUNTER — Other Ambulatory Visit: Payer: Self-pay | Admitting: Orthopedic Surgery

## 2015-04-24 ENCOUNTER — Encounter (HOSPITAL_BASED_OUTPATIENT_CLINIC_OR_DEPARTMENT_OTHER): Payer: Self-pay | Admitting: Orthopedic Surgery

## 2015-04-24 NOTE — Op Note (Signed)
NAME:  Patricia Hart, Patricia Hart NO.:  [id number]  MEDICAL RECORD NO.:  [id number]  LOCATION:                                 FACILITY:  PHYSICIAN:  Cindee Salt, M.D.            DATE OF BIRTH:  DATE OF CONSULTATION:  04/23/2015 DATE OF DISCHARGE:                                CONSULTATION   PREOPERATIVE DIAGNOSES:  Carpal tunnel syndrome, right hand; stenosing tenosynovitis, right thumb.  POSTOPERATIVE DIAGNOSES:  Carpal tunnel syndrome, right hand; stenosing tenosynovitis, right thumb.  OPERATION:  Decompression of right median nerve; release of A1 pulley, right thumb.  SURGEON:  Cindee Salt, M.D.  ANESTHESIA:  Forearm-based IV regional with local infiltration.  ANESTHESIOLOGIST:  Sheldon Silvan, M.D.  HISTORY:  The patient is a 54 year old female with a history of catching of her left thumb, carpal tunnel syndrome.  She also has a carpal tunnel syndrome on her left side with triggering of her left thumb.  Neither of these problems have responded to conservative treatment including splinting, anti-inflammatories, and injections.  She has elected to undergo surgical release of the A1 pulley of her right thumb and carpal tunnel release to the right hand.  Pre, peri, and postoperative course have been discussed along with risks and complications.  She is aware that there is no guarantee with the surgery; possibility of infection; recurrence of injury to her arteries, nerves, tendons; incomplete relief of symptoms; and dystrophy.  In the preoperative area, the patient is seen.  The extremity marked by both the patient and surgeon and antibiotic given.  PROCEDURE IN DETAIL:  The patient was brought to the operating room, where a forearm-based IV regional anesthetic was carried out without difficulty.  She was prepped using ChloraPrep, supine position with the right arm free.  A 3-minute dry time was allowed.  Time-out taken, confirming the patient and procedure.  The  thumb was attended to first. Transverse incision was made just proximal to the metacarpophalangeal joint crease, carried down through subcutaneous tissue.  The neurovascular structures identified and protected.  The A1 pulley was released on its radial aspect taking care to protect the oblique pulley. The tenosynovial tissue proximally was separated with blunt dissection. The thumb placed through a full range of motion, no further triggering was noted.  A separate incision was then made longitudinally in her right palm, carried down through subcutaneous tissue.  Bleeders again electrocauterized with bipolar.  Palmar fascia was split.  The superficial palmar arch was identified.  The flexor tendon to the ring and little finger was identified.  Retractor was placed drawing the flexor tendons, median nerve radially, and retractors ulnarly separating the ulnar nerve.  The carpal retinaculum was then incised with sharp dissection.  A right angle and Sewall retractor were placed between skin and forearm fascia.  The deep contents were separated off bluntly as was the subcutaneous tissue.  With blunt scissors, the distal forearm fascia was released for approximately 3 cm proximal to the wrist crease under direct vision.  Canal was explored.  Area of compression to the nerve was immediately apparent with significant hyperemic area.  Motor branch entered into muscle  distally.  The wound was copiously irrigated with saline and closed with interrupted 4-0 nylon sutures.  The thumb was irrigated and the incision on the thumb closed with interrupted 4-0 nylon also.  A local infiltration with 0.25% bupivacaine without epinephrine was given, approximately 9 mL was used.  A sterile compressive dressing with the fingers and thumb free was applied.  On deflation of the tourniquet, all fingers immediately pinked.  She was taken to the recovery room for observation in satisfactory condition.  She will be  discharged home to [facility] [place] [facility] [place] [facility] Nor[place].          ______________________________ Cindee SaltGary Delmon Andrada, M.D.     GK/MEDQ  D:  04/23/2015  T:  04/24/2015  Job:  [id number]

## 2015-05-13 ENCOUNTER — Encounter (HOSPITAL_BASED_OUTPATIENT_CLINIC_OR_DEPARTMENT_OTHER): Payer: Self-pay | Admitting: *Deleted

## 2015-05-16 ENCOUNTER — Encounter (HOSPITAL_BASED_OUTPATIENT_CLINIC_OR_DEPARTMENT_OTHER): Payer: Self-pay | Admitting: *Deleted

## 2015-05-16 ENCOUNTER — Ambulatory Visit (HOSPITAL_BASED_OUTPATIENT_CLINIC_OR_DEPARTMENT_OTHER): Payer: BLUE CROSS/BLUE SHIELD | Admitting: Certified Registered"

## 2015-05-16 ENCOUNTER — Ambulatory Visit (HOSPITAL_BASED_OUTPATIENT_CLINIC_OR_DEPARTMENT_OTHER)
Admission: RE | Admit: 2015-05-16 | Discharge: 2015-05-16 | Disposition: A | Discharge status: Home / Self Care | Payer: BLUE CROSS/BLUE SHIELD | Source: Ambulatory Visit | Attending: Orthopedic Surgery | Admitting: Orthopedic Surgery

## 2015-05-16 ENCOUNTER — Encounter (HOSPITAL_BASED_OUTPATIENT_CLINIC_OR_DEPARTMENT_OTHER)
Admission: RE | Disposition: A | Discharge status: Home / Self Care | Payer: Self-pay | Source: Ambulatory Visit | Attending: Orthopedic Surgery

## 2015-05-16 DIAGNOSIS — G5602 Carpal tunnel syndrome, left upper limb: Secondary | ICD-10-CM | POA: Diagnosis present

## 2015-05-16 DIAGNOSIS — M65312 Trigger thumb, left thumb: Secondary | ICD-10-CM | POA: Insufficient documentation

## 2015-05-16 DIAGNOSIS — Z86718 Personal history of other venous thrombosis and embolism: Secondary | ICD-10-CM | POA: Insufficient documentation

## 2015-05-16 DIAGNOSIS — Z791 Long term (current) use of non-steroidal anti-inflammatories (NSAID): Secondary | ICD-10-CM | POA: Insufficient documentation

## 2015-05-16 DIAGNOSIS — M19042 Primary osteoarthritis, left hand: Secondary | ICD-10-CM | POA: Insufficient documentation

## 2015-05-16 DIAGNOSIS — M19041 Primary osteoarthritis, right hand: Secondary | ICD-10-CM | POA: Diagnosis not present

## 2015-05-16 DIAGNOSIS — Z86711 Personal history of pulmonary embolism: Secondary | ICD-10-CM | POA: Insufficient documentation

## 2015-05-16 DIAGNOSIS — K219 Gastro-esophageal reflux disease without esophagitis: Secondary | ICD-10-CM | POA: Diagnosis not present

## 2015-05-16 DIAGNOSIS — Z79899 Other long term (current) drug therapy: Secondary | ICD-10-CM | POA: Insufficient documentation

## 2015-05-16 HISTORY — PX: CARPAL TUNNEL RELEASE: SHX101

## 2015-05-16 SURGERY — CARPAL TUNNEL RELEASE
Anesthesia: Monitor Anesthesia Care | Site: Wrist | Laterality: Left

## 2015-05-16 MED ORDER — OXYCODONE HCL 5 MG PO TABS
5.0000 mg | ORAL_TABLET | Freq: Once | ORAL | Status: AC | PRN
  Administered 2015-05-16: 5 mg via ORAL

## 2015-05-16 MED ORDER — HYDROMORPHONE HCL 1 MG/ML IJ SOLN
0.2500 mg | INTRAMUSCULAR | Status: DC | PRN
  Administered 2015-05-16 (×2): 0.5 mg via INTRAVENOUS

## 2015-05-16 MED ORDER — BUPIVACAINE HCL (PF) 0.25 % IJ SOLN
INTRAMUSCULAR | Status: AC
  Filled 2015-05-16: qty 120

## 2015-05-16 MED ORDER — OXYCODONE HCL 5 MG/5ML PO SOLN: 5.0000 mg | Freq: Once | ORAL | Status: AC | PRN

## 2015-05-16 MED ORDER — PROMETHAZINE HCL 25 MG/ML IJ SOLN: 6.2500 mg | INTRAMUSCULAR | Status: DC | PRN

## 2015-05-16 MED ORDER — CEFAZOLIN SODIUM 1-5 GM-% IV SOLN
INTRAVENOUS | Status: AC
  Filled 2015-05-16: qty 100

## 2015-05-16 MED ORDER — PROPOFOL 10 MG/ML IV BOLUS
INTRAVENOUS | Status: DC | PRN
  Administered 2015-05-16 (×2): 10 mg via INTRAVENOUS
  Administered 2015-05-16: 20 mg via INTRAVENOUS
  Administered 2015-05-16 (×2): 10 mg via INTRAVENOUS

## 2015-05-16 MED ORDER — BUPIVACAINE HCL (PF) 0.25 % IJ SOLN
INTRAMUSCULAR | Status: DC | PRN
  Administered 2015-05-16: 10 mL

## 2015-05-16 MED ORDER — LIDOCAINE HCL (PF) 0.5 % IJ SOLN
INTRAMUSCULAR | Status: DC | PRN
  Administered 2015-05-16: 30 mL via INTRAVENOUS

## 2015-05-16 MED ORDER — CHLORHEXIDINE GLUCONATE 4 % EX LIQD: 60.0000 mL | Freq: Once | CUTANEOUS | Status: DC

## 2015-05-16 MED ORDER — FENTANYL CITRATE (PF) 100 MCG/2ML IJ SOLN
INTRAMUSCULAR | Status: AC
  Filled 2015-05-16: qty 2

## 2015-05-16 MED ORDER — SCOPOLAMINE 1 MG/3DAYS TD PT72: 1.0000 | MEDICATED_PATCH | Freq: Once | TRANSDERMAL | Status: DC | PRN

## 2015-05-16 MED ORDER — MEPERIDINE HCL 25 MG/ML IJ SOLN: 6.2500 mg | INTRAMUSCULAR | Status: DC | PRN

## 2015-05-16 MED ORDER — LACTATED RINGERS IV SOLN
INTRAVENOUS | Status: DC
  Administered 2015-05-16: 07:00:00 via INTRAVENOUS

## 2015-05-16 MED ORDER — MIDAZOLAM HCL 2 MG/2ML IJ SOLN
INTRAMUSCULAR | Status: AC
  Filled 2015-05-16: qty 2

## 2015-05-16 MED ORDER — HYDROMORPHONE HCL 1 MG/ML IJ SOLN
INTRAMUSCULAR | Status: AC
  Filled 2015-05-16: qty 1

## 2015-05-16 MED ORDER — FENTANYL CITRATE (PF) 100 MCG/2ML IJ SOLN
50.0000 ug | INTRAMUSCULAR | Status: DC | PRN
  Administered 2015-05-16: 100 ug via INTRAVENOUS

## 2015-05-16 MED ORDER — OXYCODONE HCL 5 MG PO TABS
ORAL_TABLET | ORAL | Status: AC
  Filled 2015-05-16: qty 1

## 2015-05-16 MED ORDER — PROPOFOL 500 MG/50ML IV EMUL
INTRAVENOUS | Status: AC
  Filled 2015-05-16: qty 50

## 2015-05-16 MED ORDER — CHLORHEXIDINE GLUCONATE 4 % EX LIQD
60.0000 mL | Freq: Once | CUTANEOUS | Status: DC
Start: 2015-05-16 — End: 2015-05-16

## 2015-05-16 MED ORDER — HYDROCODONE-ACETAMINOPHEN 10-325 MG PO TABS: 1.0000 | ORAL_TABLET | Freq: Four times a day (QID) | ORAL | Status: DC | PRN

## 2015-05-16 MED ORDER — CEFAZOLIN SODIUM-DEXTROSE 2-3 GM-% IV SOLR: 2.0000 g | INTRAVENOUS | Status: DC

## 2015-05-16 MED ORDER — CEFAZOLIN SODIUM-DEXTROSE 2-3 GM-% IV SOLR
2.0000 g | INTRAVENOUS | Status: AC
  Administered 2015-05-16: 2 g via INTRAVENOUS

## 2015-05-16 MED ORDER — GLYCOPYRROLATE 0.2 MG/ML IJ SOLN: 0.2000 mg | Freq: Once | INTRAMUSCULAR | Status: DC | PRN

## 2015-05-16 MED ORDER — MIDAZOLAM HCL 2 MG/2ML IJ SOLN
1.0000 mg | INTRAMUSCULAR | Status: DC | PRN
  Administered 2015-05-16: 2 mg via INTRAVENOUS

## 2015-05-16 SURGICAL SUPPLY — 35 items
BLADE SURG 15 STRL LF DISP TIS (BLADE) ×1 IMPLANT
BLADE SURG 15 STRL SS (BLADE) ×2
BNDG CMPR 9X4 STRL LF SNTH (GAUZE/BANDAGES/DRESSINGS)
BNDG COHESIVE 3X5 TAN STRL LF (GAUZE/BANDAGES/DRESSINGS) ×2 IMPLANT
BNDG ESMARK 4X9 LF (GAUZE/BANDAGES/DRESSINGS) IMPLANT
BNDG GAUZE ELAST 4 BULKY (GAUZE/BANDAGES/DRESSINGS) ×2 IMPLANT
CHLORAPREP W/TINT 26ML (MISCELLANEOUS) ×2 IMPLANT
CORDS BIPOLAR (ELECTRODE) ×2 IMPLANT
COVER BACK TABLE 60X90IN (DRAPES) ×2 IMPLANT
COVER MAYO STAND STRL (DRAPES) ×2 IMPLANT
CUFF TOURNIQUET SINGLE 18IN (TOURNIQUET CUFF) ×2 IMPLANT
DRAPE EXTREMITY T 121X128X90 (DRAPE) ×2 IMPLANT
DRAPE SURG 17X23 STRL (DRAPES) ×2 IMPLANT
DRSG PAD ABDOMINAL 8X10 ST (GAUZE/BANDAGES/DRESSINGS) ×2 IMPLANT
GAUZE SPONGE 4X4 12PLY STRL (GAUZE/BANDAGES/DRESSINGS) ×2 IMPLANT
GAUZE XEROFORM 1X8 LF (GAUZE/BANDAGES/DRESSINGS) ×2 IMPLANT
GLOVE BIOGEL PI IND STRL 7.0 (GLOVE) IMPLANT
GLOVE BIOGEL PI IND STRL 8.5 (GLOVE) ×1 IMPLANT
GLOVE BIOGEL PI INDICATOR 7.0 (GLOVE) ×1
GLOVE BIOGEL PI INDICATOR 8.5 (GLOVE) ×1
GLOVE SURG ORTHO 8.0 STRL STRW (GLOVE) ×1 IMPLANT
GOWN STRL REUS W/ TWL LRG LVL3 (GOWN DISPOSABLE) ×1 IMPLANT
GOWN STRL REUS W/TWL LRG LVL3 (GOWN DISPOSABLE) ×2
GOWN STRL REUS W/TWL XL LVL3 (GOWN DISPOSABLE) ×2 IMPLANT
NDL PRECISIONGLIDE 27X1.5 (NEEDLE) IMPLANT
NEEDLE PRECISIONGLIDE 27X1.5 (NEEDLE) ×2 IMPLANT
NS IRRIG 1000ML POUR BTL (IV SOLUTION) ×2 IMPLANT
PACK BASIN DAY SURGERY FS (CUSTOM PROCEDURE TRAY) ×2 IMPLANT
STOCKINETTE 4X48 STRL (DRAPES) ×2 IMPLANT
SUT ETHILON 4 0 PS 2 18 (SUTURE) ×2 IMPLANT
SUT VICRYL 4-0 PS2 18IN ABS (SUTURE) ×1 IMPLANT
SYR BULB 3OZ (MISCELLANEOUS) ×2 IMPLANT
SYR CONTROL 10ML LL (SYRINGE) ×1 IMPLANT
TOWEL OR 17X24 6PK STRL BLUE (TOWEL DISPOSABLE) ×2 IMPLANT
UNDERPAD 30X30 (UNDERPADS AND DIAPERS) ×2 IMPLANT

## 2015-05-16 NOTE — Discharge Instructions (Addendum)

## 2015-05-16 NOTE — Op Note (Signed)
Dictation Number [id number]

## 2015-05-16 NOTE — Transfer of Care (Signed)
Immediate Anesthesia Transfer of Care Note  Patient: Patricia Hart  Procedure(s) Performed: Procedure(s): LEFT CARPAL TUNNEL RELEASE (Left)  Patient Location: PACU  Anesthesia Type:MAC combined with regional for post-op pain  Level of Consciousness: awake, alert  and oriented  Airway & Oxygen Therapy: Patient Spontanous Breathing  Post-op Assessment: Report given to RN and Post -op Vital signs reviewed and stable  Post vital signs: Reviewed and stable  Last Vitals:  Filed Vitals:   05/16/15 0625  BP: 125/55  Pulse: 71  Temp: 36.6 C  Resp: 16    Complications: No apparent anesthesia complications

## 2015-05-16 NOTE — Op Note (Signed)
NAMEJENIE, PARISH NO.:  [id number]  MEDICAL RECORD NO.:  [id number]  LOCATION:                                 FACILITY:  PHYSICIAN:  Cindee Salt, M.D.            DATE OF BIRTH:  DATE OF PROCEDURE:  05/16/2015 DATE OF DISCHARGE:                              OPERATIVE REPORT   PREOPERATIVE DIAGNOSES: 1. Carpal tunnel syndrome, left hand. 2. Stenosing tenosynovitis, left thumb.  POSTOPERATIVE DIAGNOSES: 1. Carpal tunnel syndrome, left hand. 2. Stenosing tenosynovitis, left thumb.  OPERATION: 1. Release of A1 pulley, left thumb. 2. Carpal tunnel release, left hand.  SURGEON:  Cindee Salt, M.D.  ANESTHESIA:  Forearm IV regional with local infiltration.  ANESTHESIOLOGIST:  Hezzie Bump. Okey Dupre, M.D.  HISTORY:  The patient is a 54 year old female with a history of bilateral carpal tunnel syndrome, bilateral trigger thumb.  She has undergone release of the right carpal canal and thumb in the past, which she has done well.  She is admitted now for release of the left thumb and median nerve at her wrist.  Pre, peri, and postoperative course have been discussed along with risks and complications.  She is aware that there is no guarantee with the surgery; possibility of infection; recurrence of injury to arteries, nerves, tendons; incomplete relief of symptoms; dystrophy.  In the preoperative area, the patient is seen, the extremity marked by both patient and surgeon, and antibiotic given.  DESCRIPTION OF PROCEDURE:  The patient was brought to the operating room, where a forearm-based IV regional anesthetic was carried out without difficulty.  She was prepped using ChloraPrep, supine position with left arm free.  A 3-minute dry time was allowed.  Time-out taken, confirming the patient and procedure.  A transverse incision was made over the A1 pulley of the left thumb, carried down through subcutaneous tissue.  Bleeders were electrocauterized.  The sensory nerves  were identified.  Retractor was placed.  The A1 pulley was identified.  This was released on its radial aspect.  The oblique pulley was left intact. Synovial tissue proximally was separated with blunt dissection.  The thumb placed through a full range motion, no further triggering was noted.  A longitudinal incision was then made in the left palm, carried down through subcutaneous tissue.  Bleeders were again electrocauterized with bipolar.  The palmar fascia was split.  Superficial palmar arch was identified.  The flexor tendon to the ring and little finger identified to the ulnar side of the median nerve.  The carpal retinaculum was incised with sharp dissection.  A right-angle and Sewall retractor were placed between skin and forearm fascia.  The fascia was released for approximately 2 cm proximal to the wrist crease under direct vision. Canal was explored.  Area of compression to the median nerve was apparent.  Motor branch was noted, entered into muscle distally.  The wounds were copiously irrigated with saline and the skin then closed with interrupted 4-0 nylon.  A local infiltration with 0.25% bupivacaine without epinephrine was given, 10 in total mL were used for the two incisions.  A sterile compressive dressing with the thumb free was applied.  On deflation of the tourniquet, all fingers immediately pinked.  She was taken to the recovery room for observation in satisfactory condition.  She will be discharged home [facility] [place] [facility] on Norco.          ______________________________ Cindee SaltGary Arwa Yero, M.D.     GK/MEDQ  D:  05/16/2015  T:  05/16/2015  Job:  [id number]

## 2015-05-16 NOTE — H&P (Signed)
Patricia Hart is an 54 y.o. female.   Chief Complaint: Patricia Hart is a 54 year old right hand dominant female complaining of bilateral hand pain. She is referred by Dr. Cleophas Dunker for consultation. She complains of a discomfort in her entire hand with numbness and tingling of all her fingers. This has been going on for 10 years, worse over the past two years. She has had injections to the carpal tunnels bilaterally and to the trigger thumb on her left side. She states that she has a pain score between 4 and 8/10. She has no history of injury to her hand. She has no history of injury to her neck, but states that she does have a disk. She is awakened 7/7 nights with numbness and tingling. She had nerve conductions performed on 12/31/14 revealing bilateral carpal tunnel syndrome. She has been wearing night splints. She has been taking Lyrica and Mobic. She has no history of diabetes, thyroid problems, arthritis or gout. She has had injections of both carpal tunnels. The left thumb was injected two weeks ago. The carpal tunnels approximately two months ago. She is also complaining of catching left thumb.She has had carpal tunnel release in addition to release of A1 pulley of the right thumb.  HPI: see above  Past Medical History  Diagnosis Date  . Blood transfusion without reported diagnosis 1997  . Clotting disorder (HCC) 1997    pregnancy induced dvt- no problems since then  . Overactive bladder   . History of DVT (deep vein thrombosis)     during pregnancy  . History of pulmonary embolism     occurred during pregnancy  . GERD (gastroesophageal reflux disease)     Past Surgical History  Procedure Laterality Date  . Ablation  2006    endometrial  . Breast implants      saline  . Carpal tunnel release Right 04/23/2015    Procedure: RIGHT CARPAL TUNNEL RELEASE;  Surgeon: Cindee Salt, MD;  Location: [facility];  Service: Orthopedics;  Laterality: Right;  . Trigger finger release  Right 04/23/2015    Procedure: RELEASE TRIGGER FINGER/A-1 PULLEY RIGHT THUMB;  Surgeon: Cindee Salt, MD;  Location: [facility];  Service: Orthopedics;  Laterality: Right;    Family History  Problem Relation Age of Onset  . Hyperlipidemia Mother   . Cancer Mother     colon; lung  . Diabetes Father   . Hyperlipidemia Father   . Hypertension Father   . Breast cancer Maternal Aunt     50's  . Osteoporosis Mother    Social History:  reports that she has never smoked. She does not have any smokeless tobacco history on file. She reports that she drinks about 1.2 oz of alcohol per week. She reports that she does not use illicit drugs.  Allergies:  Allergies  Allergen Reactions  . Latex Other (See Comments)    Skin irritation    Medications Prior to Admission  Medication Sig Dispense Refill  . HYDROcodone-acetaminophen (NORCO) 5-325 MG tablet Take 1 tablet by mouth every 6 (six) hours as needed for moderate pain. 30 tablet 0  . LYRICA 200 MG capsule TAKE 1 CAPSULE TWICE A DAY 60 capsule 3  . meloxicam (MOBIC) 15 MG tablet Take 1 tablet by mouth daily.  0  . omeprazole (PRILOSEC) 20 MG capsule Take 20 mg by mouth daily.    Marland Kitchen tiZANidine (ZANAFLEX) 4 MG tablet Take 4 mg by mouth daily.   0    No  results found for this or any previous visit (from the past 48 hour(s)).  No results found.   Pertinent items are noted in HPI.  Blood pressure 125/55, pulse 71, temperature 97.8 F (36.6 C), temperature source Oral, resp. rate 16, height 5\' 3"  (1.6 m), weight 67.699 kg (149 lb 4 oz), SpO2 99 %.  General appearance: alert, cooperative and appears stated age Head: Normocephalic, without obvious abnormality Neck: no JVD Resp: clear to auscultation bilaterally Cardio: regular rate and rhythm, S1, S2 normal, no murmur, click, rub or gallop GI: soft, non-tender; bowel sounds normal; no masses,  no organomegaly Extremities: catching left thumb and numbness left hand Pulses: 2+  and symmetric Skin: Skin color, texture, turgor normal. No rashes or lesions Neurologic: Grossly normal Incision/Wound: na  Assessment/Plan Bilateral carpal tunnel, bilateral trigger fingers, thumbs, CMC arthritis bilaterally.  PLAN: Carpal tunnel release, left hand and release A1 pulley left thumb. . She is aware that there is no guarantee with the surgery, possibility of infection, recurrence, injury to arteries, nerves, tendons, possibilityof dystrophy, complex regional pain, Pre, peri and postoperative course. She is advised she will be placed in no splints, that we will want mobility afterwards, that we will inject these with a longacting anesthetic but we will prescribe her pain medicine for relief. This will be scheduled as an outpatient under regional anesthesia for eft carpal tunnel release, left thumb A1 pulley.   Brittnie Lewey R 05/16/2015, 7:24 AM

## 2015-05-16 NOTE — Anesthesia Postprocedure Evaluation (Signed)
Anesthesia Post Note  Patient: Patricia Hart  Procedure(s) Performed: Procedure(s) (LRB): LEFT CARPAL TUNNEL RELEASE (Left)  Patient location during evaluation: PACU Anesthesia Type: MAC and Bier Block Level of consciousness: awake and alert Pain management: pain level controlled Vital Signs Assessment: post-procedure vital signs reviewed and stable Respiratory status: spontaneous breathing Cardiovascular status: stable Anesthetic complications: no    Last Vitals:  Filed Vitals:   05/16/15 0845 05/16/15 0930  BP: 91/64 126/83  Pulse: 61 64  Temp:  36.6 C  Resp: 11 18    Last Pain:  Filed Vitals:   05/16/15 0932  PainSc: 4                  Lewie LoronJohn Liam Bossman

## 2015-05-16 NOTE — Anesthesia Preprocedure Evaluation (Signed)
Anesthesia Evaluation  Patient identified by MRN, date of birth, ID band Patient awake    Reviewed: Allergy & Precautions, NPO status , Patient's Chart, lab work & pertinent test results  Airway Mallampati: I  TM Distance: >3 FB Neck ROM: Full    Dental  (+) Teeth Intact, Dental Advisory Given   Pulmonary  breath sounds clear to auscultation        Cardiovascular Rhythm:Regular Rate:Normal     Neuro/Psych    GI/Hepatic GERD-  Medicated and Controlled,  Endo/Other    Renal/GU      Musculoskeletal   Abdominal   Peds  Hematology   Anesthesia Other Findings   Reproductive/Obstetrics                             Anesthesia Physical Anesthesia Plan  ASA: I  Anesthesia Plan: MAC and Bier Block   Post-op Pain Management:    Induction: Intravenous  Airway Management Planned: Simple Face Mask  Additional Equipment:   Intra-op Plan:   Post-operative Plan:   Informed Consent: I have reviewed the patients History and Physical, chart, labs and discussed the procedure including the risks, benefits and alternatives for the proposed anesthesia with the patient or authorized representative who has indicated his/her understanding and acceptance.   Dental advisory given  Plan Discussed with: CRNA, Anesthesiologist and Surgeon  Anesthesia Plan Comments:         Anesthesia Quick Evaluation  

## 2015-05-16 NOTE — Brief Op Note (Signed)
05/16/2015  8:13 AM  PATIENT:  Patricia Hart  54 y.o. female  PRE-OPERATIVE DIAGNOSIS:  LEFT CARPAL TUNNEL  POST-OPERATIVE DIAGNOSIS:  LEFT CARPAL TUNNEL SYNDROME  PROCEDURE:  Procedure(s): LEFT CARPAL TUNNEL RELEASE (Left)  SURGEON:  Surgeon(s) and Role:    * Cindee SaltGary Khloie Hamada, MD - Primary  PHYSICIAN ASSISTANT:   ASSISTANTS: none   ANESTHESIA:   local and regional  EBL:  Total I/O In: 500 [I.V.:500] Out: -   BLOOD ADMINISTERED:none  DRAINS: none   LOCAL MEDICATIONS USED:  BUPIVICAINE   SPECIMEN:  No Specimen  DISPOSITION OF SPECIMEN:  N/A  COUNTS:  YES  TOURNIQUET:   Total Tourniquet Time Documented: Forearm (Left) - 22 minutes Total: Forearm (Left) - 22 minutes   DICTATION: .Other Dictation: Dictation Number [id number]  PLAN OF CARE: Discharge to home after PACU  PATIENT DISPOSITION:  PACU - hemodynamically stable.

## 2015-05-17 ENCOUNTER — Encounter (HOSPITAL_BASED_OUTPATIENT_CLINIC_OR_DEPARTMENT_OTHER): Payer: Self-pay | Admitting: Orthopedic Surgery

## 2015-06-26 ENCOUNTER — Telehealth: Payer: Self-pay | Admitting: *Deleted

## 2015-06-26 NOTE — Telephone Encounter (Signed)
Called pt no answer LMOM with md response,.../lmb

## 2015-06-26 NOTE — Telephone Encounter (Signed)
Receive call pt is requesting refill on her hydrocodone 10/325 mg last filled 05/16/15....Raechel Chute

## 2015-06-26 NOTE — Telephone Encounter (Signed)
We have never prescribed these for her. They were given to her after hand surgery by her surgeon. If she is still having hand pain she should contact their office. Would need a visit if she wants [place] to prescribe.

## 2015-07-31 ENCOUNTER — Other Ambulatory Visit: Payer: Self-pay | Admitting: Internal Medicine

## 2015-08-01 NOTE — Telephone Encounter (Signed)
Sent to pharmacy 

## 2015-08-30 DIAGNOSIS — M5116 Intervertebral disc disorders with radiculopathy, lumbar region: Secondary | ICD-10-CM | POA: Diagnosis not present

## 2015-08-30 DIAGNOSIS — M5106 Intervertebral disc disorders with myelopathy, lumbar region: Secondary | ICD-10-CM | POA: Diagnosis not present

## 2015-08-30 DIAGNOSIS — M545 Low back pain: Secondary | ICD-10-CM | POA: Diagnosis not present

## 2015-09-04 ENCOUNTER — Encounter (HOSPITAL_COMMUNITY): Payer: Self-pay | Admitting: *Deleted

## 2015-09-04 ENCOUNTER — Emergency Department (HOSPITAL_COMMUNITY)
Admission: EM | Admit: 2015-09-04 | Discharge: 2015-09-05 | Disposition: A | Discharge status: Home / Self Care | Payer: BLUE CROSS/BLUE SHIELD | Attending: Emergency Medicine | Admitting: Emergency Medicine

## 2015-09-04 DIAGNOSIS — R509 Fever, unspecified: Secondary | ICD-10-CM | POA: Insufficient documentation

## 2015-09-04 DIAGNOSIS — Z791 Long term (current) use of non-steroidal anti-inflammatories (NSAID): Secondary | ICD-10-CM | POA: Insufficient documentation

## 2015-09-04 DIAGNOSIS — M545 Low back pain: Secondary | ICD-10-CM | POA: Diagnosis not present

## 2015-09-04 DIAGNOSIS — M5431 Sciatica, right side: Secondary | ICD-10-CM | POA: Diagnosis not present

## 2015-09-04 DIAGNOSIS — M5442 Lumbago with sciatica, left side: Secondary | ICD-10-CM | POA: Diagnosis not present

## 2015-09-04 DIAGNOSIS — Z79899 Other long term (current) drug therapy: Secondary | ICD-10-CM | POA: Diagnosis not present

## 2015-09-04 DIAGNOSIS — K219 Gastro-esophageal reflux disease without esophagitis: Secondary | ICD-10-CM | POA: Diagnosis not present

## 2015-09-04 DIAGNOSIS — M5440 Lumbago with sciatica, unspecified side: Secondary | ICD-10-CM | POA: Diagnosis not present

## 2015-09-04 DIAGNOSIS — Z87448 Personal history of other diseases of urinary system: Secondary | ICD-10-CM | POA: Insufficient documentation

## 2015-09-04 DIAGNOSIS — R51 Headache: Secondary | ICD-10-CM | POA: Diagnosis not present

## 2015-09-04 DIAGNOSIS — Z91040 Latex allergy status: Secondary | ICD-10-CM | POA: Diagnosis not present

## 2015-09-04 DIAGNOSIS — Z86711 Personal history of pulmonary embolism: Secondary | ICD-10-CM | POA: Diagnosis not present

## 2015-09-04 MED ORDER — OXYCODONE-ACETAMINOPHEN 5-325 MG PO TABS
ORAL_TABLET | ORAL | Status: AC
  Filled 2015-09-04: qty 1

## 2015-09-04 MED ORDER — OXYCODONE-ACETAMINOPHEN 5-325 MG PO TABS
1.0000 | ORAL_TABLET | ORAL | Status: DC | PRN
  Administered 2015-09-04: 1 via ORAL

## 2015-09-04 MED ORDER — OXYCODONE-ACETAMINOPHEN 5-325 MG PO TABS: 1.0000 | ORAL_TABLET | Freq: Once | ORAL | Status: DC

## 2015-09-04 NOTE — ED Notes (Signed)
Pt states that she had an epidural on Friday, states that she has been having worsening back pain since. States she has had 2 epidurals before with no reaction like this. Denies urinary incontinence.

## 2015-09-04 NOTE — ED Provider Notes (Signed)
CSN: [id number]     Arrival date & time 09/04/15  2102 History  By signing my name below, I, Patricia Hart, attest that this documentation has been prepared under the direction and in the presence of Patricia Bucco, MD. Electronically Signed: [name], ED Scribe. 09/05/2015. 1:56 AM   Chief Complaint  Patient presents with  . Back Pain    The history is provided by the patient. No language interpreter was used.   Patricia Hart is a 55 y.o. female who presents to the Emergency Department complaining of constant, 10/10 in severity, aching, lower back pain with onset 6 days ago after an epidural. Pt states that she has had 2 other epidurals for her chronic back pain and the pain typically gets worse before it gets better but this pain is worse than usual. The pain radiates down the LLE and up the spine to the head. Bending forward exacerbates the head pain and it is not improved with laying flat. A Vicodin, that was prescribed to her mother, provided no pain relief at home.  Associated symptoms include low grade fever. Pt denies swelling at the injection site, new numbness or weakness, difficulty urinating, dysuria, vision change, and photophobia.   Past Medical History  Diagnosis Date  . Blood transfusion without reported diagnosis 1997  . Clotting disorder (HCC) 1997    pregnancy induced dvt- no problems since then  . Overactive bladder   . History of DVT (deep vein thrombosis)     during pregnancy  . History of pulmonary embolism     occurred during pregnancy  . GERD (gastroesophageal reflux disease)    Past Surgical History  Procedure Laterality Date  . Ablation  2006    endometrial  . Breast implants      saline  . Carpal tunnel release Right 04/23/2015    Procedure: RIGHT CARPAL TUNNEL RELEASE;  Surgeon: Patricia Salt, MD;  Location: [facility];  Service: Orthopedics;  Laterality: Right;  . Trigger finger release Right 04/23/2015    Procedure: RELEASE TRIGGER  FINGER/A-1 PULLEY RIGHT THUMB;  Surgeon: Patricia Salt, MD;  Location: [facility];  Service: Orthopedics;  Laterality: Right;  . Carpal tunnel release Left 05/16/2015    Procedure: LEFT CARPAL TUNNEL RELEASE;  Surgeon: Patricia Salt, MD;  Location: [facility];  Service: Orthopedics;  Laterality: Left;   Family History  Problem Relation Age of Onset  . Hyperlipidemia Mother   . Cancer Mother     colon; lung  . Diabetes Father   . Hyperlipidemia Father   . Hypertension Father   . Breast cancer Maternal Aunt     50's  . Osteoporosis Mother    Social History  Substance Use Topics  . Smoking status: Never Smoker   . Smokeless tobacco: None  . Alcohol Use: 1.2 oz/week    2 Standard drinks or equivalent per week     Comment: 0-4 drinks/wk   OB History    Gravida Para Term Preterm AB TAB SAB Ectopic Multiple Living   Review of Systems  Constitutional: Positive for fever (low grade).  Eyes: Negative for photophobia and visual disturbance.  Genitourinary: Negative for dysuria and difficulty urinating.  Musculoskeletal: Positive for back pain.  Neurological: Positive for headaches. Negative for weakness and numbness.  All other systems reviewed and are negative.  Allergies  Latex  Home Medications   Prior to Admission medications  Medication Sig Start Date End Date Taking? Authorizing Provider  acetaminophen (TYLENOL) 500 MG tablet Take 1,500 mg by mouth every 6 (six) hours as needed for mild pain.   Yes Historical Provider, MD  HYDROcodone-acetaminophen (NORCO) 5-325 MG tablet Take 1 tablet by mouth every 6 (six) hours as needed for moderate pain. 04/23/15  Yes Patricia SaltGary Kuzma, MD  ibuprofen (ADVIL,MOTRIN) 200 MG tablet Take 400 mg by mouth every 6 (six) hours as needed for moderate pain.   Yes Historical Provider, MD  LYRICA 200 MG capsule TAKE ONE CAPSULE BY MOUTH TWICE A DAY 08/01/15  Yes Patricia BrokerElizabeth A Crawford, MD  meloxicam (MOBIC) 15 MG  tablet Take 1 tablet by mouth every evening.  11/28/14  Yes Historical Provider, MD  omeprazole (PRILOSEC) 20 MG capsule Take 20 mg by mouth daily as needed (acid reflux).    Yes Historical Provider, MD  tiZANidine (ZANAFLEX) 4 MG tablet Take 4 mg by mouth every 6 (six) hours as needed for muscle spasms.  03/21/15  Yes Historical Provider, MD  oxyCODONE-acetaminophen (PERCOCET/ROXICET) 5-325 MG tablet Take 2 tablets by mouth every 4 (four) hours as needed for severe pain. 09/05/15   Patricia BuccoMelanie Nico Syme, MD   BP 122/72 mmHg  Pulse 88  Temp(Src) 98 F (36.7 C) (Oral)  Resp 20  SpO2 100% Physical Exam  Constitutional: She is oriented to person, place, and time. She appears well-developed and well-nourished. No distress.  HENT:  Head: Normocephalic and atraumatic.  Eyes: EOM are normal.  Neck: Normal range of motion.  Cardiovascular: Normal rate, regular rhythm and normal heart sounds.   Pulmonary/Chest: Effort normal and breath sounds normal.  Abdominal: Soft. She exhibits no distension. There is no tenderness.  Musculoskeletal: Normal range of motion.  Neurological: She is alert and oriented to person, place, and time.  Skin: Skin is warm and dry.  Psychiatric: She has a normal mood and affect. Judgment normal.  Nursing note and vitals reviewed.   ED Course  Procedures (including critical care time) DIAGNOSTIC STUDIES: Oxygen Saturation is 100% on RA,  normal by my interpretation.    COORDINATION OF CARE: 12:12 AM Discussed treatment plan which includes pain management with pt at bedside and pt agreed to plan.  1:55 AM I re-evaluated the patient and the she has had no pain improvement in pain. Plan to repeat Dilaudid.    Labs Review Labs Reviewed  BASIC METABOLIC PANEL - Abnormal; Notable for the following:    Chloride 99 (*)    Glucose, Bld 140 (*)    All other components within normal limits  CBC WITH DIFFERENTIAL/PLATELET    Imaging Review Mr Lumbar Spine Wo  Contrast  09/05/2015  CLINICAL DATA:  10/10 constant back pain for 6 days after epidural. History chronic back pain. Pain radiates to LEFT lower extremity, low-grade fever. EXAM: MRI LUMBAR SPINE WITHOUT CONTRAST TECHNIQUE: Multiplanar, multisequence MR imaging of the lumbar spine was performed. No intravenous contrast was administered. COMPARISON:  None. FINDINGS: Lumbar vertebral bodies and posterior elements are intact aligned with maintenance of lumbar lordosis. Using the reference level of the last well-formed intervertebral disc as L5-S1, mild L1-2, mild L3-4, severe L5-S1 disc height loss. Decreased T2 signal within the discs compatible with mild desiccation. Moderate to severe subacute on chronic discogenic endplate changes L5-S1. No STIR signal abnormality to suggest acute osseous process. Conus medullaris terminates at L1 and appears normal in morphology and signal characteristics. Cauda equina is normal. Included prevertebral and paraspinal soft tissues are normal, no epidural  fluid collection. Level by level evaluation: L1-2: Small broad-based disc bulge. No canal stenosis or neural foraminal narrowing. L2-3: No disc bulge, canal stenosis nor neural foraminal narrowing. L3-4: Small broad-based disc bulge. Mild facet arthropathy and ligamentum flavum redundancy without canal stenosis or neural foraminal narrowing. L4-5: No disc bulge. Mild facet arthropathy and ligamentum flavum redundancy without canal stenosis or neural foraminal narrowing. L5-S1: Moderate broad-based disc bulge with central extruded component, 10 mm of contiguous superior and inferior migration. Disc bulge contacts the traversing S1 nerves within the lateral recess. No canal stenosis. Mild RIGHT, moderate to severe LEFT neural foraminal narrowing. IMPRESSION: No acute process within the lumbar spine, no epidural fluid collection. Degenerative change of lumbar spine without canal stenosis. Disc bulge at L5-S1 may affect the traversing  S1 nerves in the lateral recess. Neural foraminal narrowing L5-S1: Moderate to severe on the LEFT at this level. Electronically Signed   By: Awilda Metro M.D.   On: 09/05/2015 06:12     EKG Interpretation None      MDM   Final diagnoses:  Midline low back pain with sciatica, sciatica laterality unspecified    Patient presents with worsening low back pain following an epidural steroid injection this past Friday. There is no visible swelling to the back. No neurologic deficits. She was initially given Dilaudid for pain and Valium for muscle spasm. She got no relief with this and was crying and writhing around on the bed. Given this, I did do an MRI of the back. There is no evidence of abscess or hematoma. No other acute abnormality is found. She does have significant dyspnea and L5-S1. Again she has no neurologic deficits or signs of cauda equina. She was given additional dose of Dilaudid in the ED. She was discharged home in good condition. I encouraged her to follow-up with her spine surgeon. Return precautions were given.  I personally performed the services described in this documentation, which was scribed in my presence.  The recorded information has been reviewed and considered.    Patricia Bucco, MD 09/05/15 [phone]

## 2015-09-05 ENCOUNTER — Emergency Department (HOSPITAL_COMMUNITY): Payer: BLUE CROSS/BLUE SHIELD

## 2015-09-05 DIAGNOSIS — M545 Low back pain: Secondary | ICD-10-CM | POA: Diagnosis not present

## 2015-09-05 LAB — CBC WITH DIFFERENTIAL/PLATELET
BASOS ABS: 0 10*3/uL (ref 0.0–0.1)
BASOS PCT: 0 %
EOS PCT: 0 %
Eosinophils Absolute: 0 10*3/uL (ref 0.0–0.7)
HCT: 37.3 % (ref 36.0–46.0)
Hemoglobin: 12.5 g/dL (ref 12.0–15.0)
Lymphocytes Relative: 26 %
Lymphs Abs: 2.6 10*3/uL (ref 0.7–4.0)
MCH: 30.6 pg (ref 26.0–34.0)
MCHC: 33.5 g/dL (ref 30.0–36.0)
MCV: 91.2 fL (ref 78.0–100.0)
MONO ABS: 0.6 10*3/uL (ref 0.1–1.0)
Monocytes Relative: 6 %
Neutro Abs: 6.7 10*3/uL (ref 1.7–7.7)
Neutrophils Relative %: 68 %
PLATELETS: 197 10*3/uL (ref 150–400)
RBC: 4.09 MIL/uL (ref 3.87–5.11)
RDW: 13.1 % (ref 11.5–15.5)
WBC: 10 10*3/uL (ref 4.0–10.5)

## 2015-09-05 LAB — BASIC METABOLIC PANEL
ANION GAP: 12 (ref 5–15)
BUN: 14 mg/dL (ref 6–20)
CALCIUM: 9.1 mg/dL (ref 8.9–10.3)
CO2: 24 mmol/L (ref 22–32)
Chloride: 99 mmol/L — ABNORMAL LOW (ref 101–111)
Creatinine, Ser: 0.75 mg/dL (ref 0.44–1.00)
Glucose, Bld: 140 mg/dL — ABNORMAL HIGH (ref 65–99)
Potassium: 3.7 mmol/L (ref 3.5–5.1)
Sodium: 135 mmol/L (ref 135–145)

## 2015-09-05 MED ORDER — DIAZEPAM 5 MG/ML IJ SOLN
5.0000 mg | Freq: Once | INTRAMUSCULAR | Status: AC
  Administered 2015-09-05: 5 mg via INTRAVENOUS
  Filled 2015-09-05: qty 2

## 2015-09-05 MED ORDER — HYDROMORPHONE HCL 1 MG/ML IJ SOLN
1.0000 mg | Freq: Once | INTRAMUSCULAR | Status: AC
  Administered 2015-09-05: 1 mg via INTRAVENOUS
  Filled 2015-09-05: qty 1

## 2015-09-05 MED ORDER — OXYCODONE-ACETAMINOPHEN 5-325 MG PO TABS: 2.0000 | ORAL_TABLET | ORAL | Status: DC | PRN

## 2015-09-05 MED ORDER — ONDANSETRON HCL 4 MG/2ML IJ SOLN
4.0000 mg | Freq: Once | INTRAMUSCULAR | Status: AC
  Administered 2015-09-05: 4 mg via INTRAVENOUS
  Filled 2015-09-05: qty 2

## 2015-09-05 NOTE — ED Notes (Signed)
Reported vomiting to dr. Fredderick PhenixBelfi. md allows 4mg  of zofran IV.

## 2015-09-05 NOTE — ED Notes (Signed)
Patient sleeping quietly

## 2015-09-05 NOTE — ED Notes (Signed)
Reported pain not being relieved by meds so far to dr. Fredderick Phenixbelfi, patient moaning and ambulating currently for comfort. MD acknowledges, consulting ortho.

## 2015-09-05 NOTE — ED Notes (Signed)
Dr. Belfi at the bedside.  

## 2015-09-05 NOTE — ED Notes (Signed)
Verbal order for 5mg  of valium from dr. Fredderick Phenixbelfi.

## 2015-09-05 NOTE — Discharge Instructions (Signed)

## 2015-09-16 DIAGNOSIS — M5106 Intervertebral disc disorders with myelopathy, lumbar region: Secondary | ICD-10-CM | POA: Diagnosis not present

## 2015-09-16 DIAGNOSIS — M545 Low back pain: Secondary | ICD-10-CM | POA: Diagnosis not present

## 2015-09-16 DIAGNOSIS — M5116 Intervertebral disc disorders with radiculopathy, lumbar region: Secondary | ICD-10-CM | POA: Diagnosis not present

## 2015-09-17 DIAGNOSIS — D225 Melanocytic nevi of trunk: Secondary | ICD-10-CM | POA: Diagnosis not present

## 2015-09-20 DIAGNOSIS — M545 Low back pain: Secondary | ICD-10-CM | POA: Diagnosis not present

## 2015-09-21 DIAGNOSIS — L910 Hypertrophic scar: Secondary | ICD-10-CM | POA: Diagnosis not present

## 2015-09-21 DIAGNOSIS — L905 Scar conditions and fibrosis of skin: Secondary | ICD-10-CM | POA: Diagnosis not present

## 2015-09-21 DIAGNOSIS — D225 Melanocytic nevi of trunk: Secondary | ICD-10-CM | POA: Diagnosis not present

## 2015-09-30 ENCOUNTER — Other Ambulatory Visit: Payer: Self-pay | Admitting: Internal Medicine

## 2015-09-30 DIAGNOSIS — M5137 Other intervertebral disc degeneration, lumbosacral region: Secondary | ICD-10-CM | POA: Diagnosis not present

## 2015-10-01 NOTE — Telephone Encounter (Signed)
Sent to pharmacy 

## 2015-10-01 NOTE — Telephone Encounter (Signed)
Patient has an upcoming appt on 10/21/15. Last refill was 08/01/15. Please advise in Dr. Frutoso Chaserawford's absence, thanks.

## 2015-10-08 ENCOUNTER — Other Ambulatory Visit: Payer: Self-pay | Admitting: Neurological Surgery

## 2015-10-14 IMAGING — US US ABDOMEN COMPLETE
1 series · 14 of 25 positions shown · non-contrast
Comparison: None

CLINICAL DATA: Epigastric pain, history clotting disorder, DVT,
pulmonary embolism, fibromyalgia

EXAM:
ULTRASOUND ABDOMEN COMPLETE

[Series 1: us abdomen complete · 0.32mm/px · 14 of 84 slices shown]
[im 1/84]
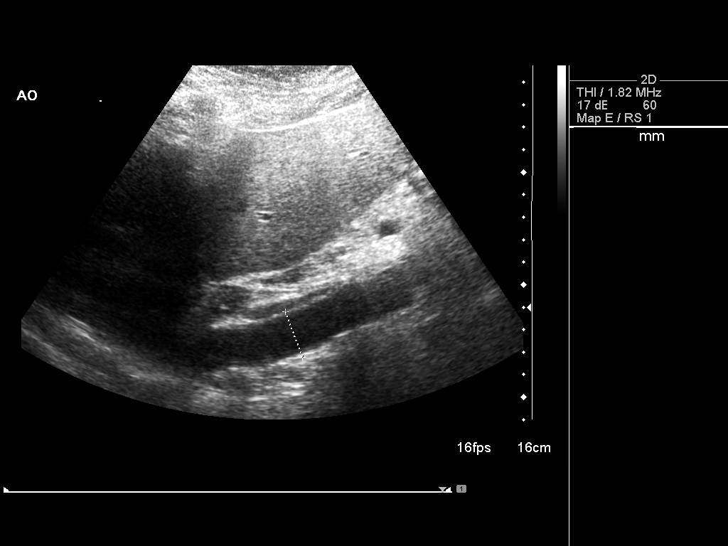
[im 7/84]
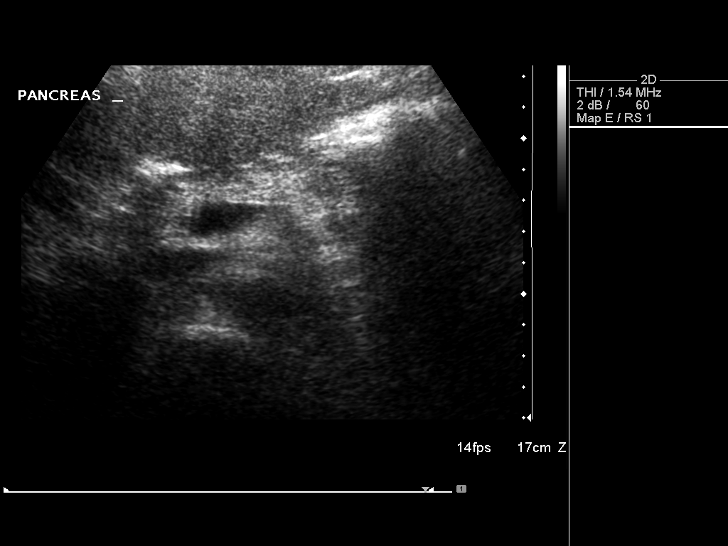
[im 14/84]
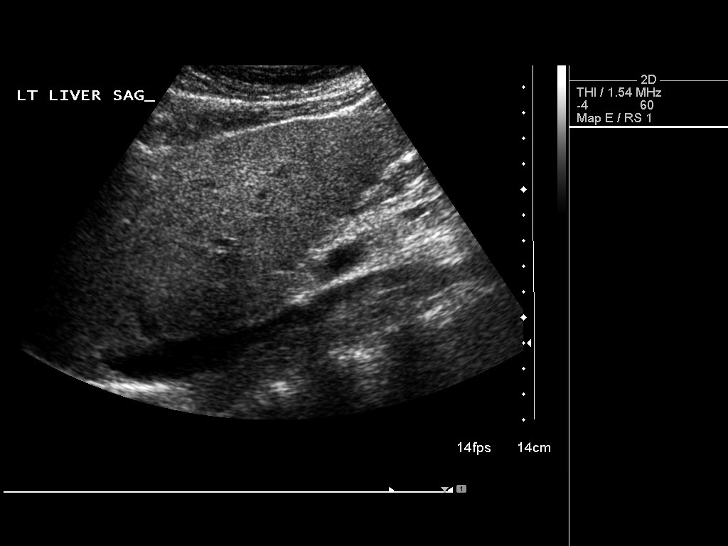
[im 21/84]
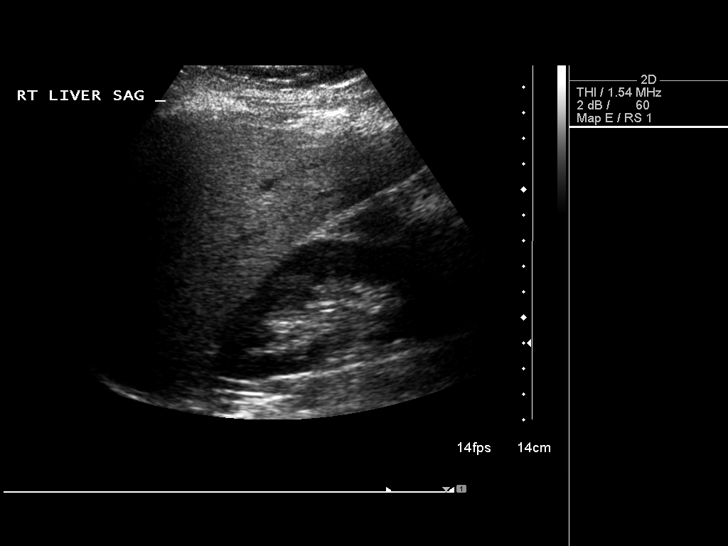
[im 28/84]
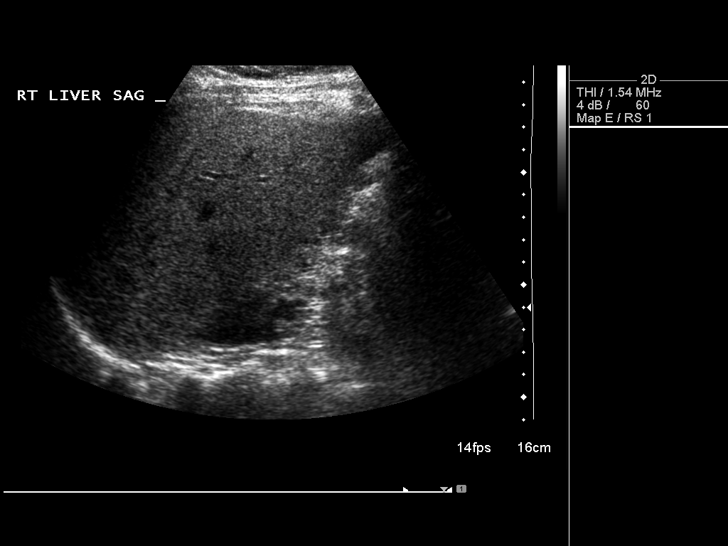
[im 32/84]
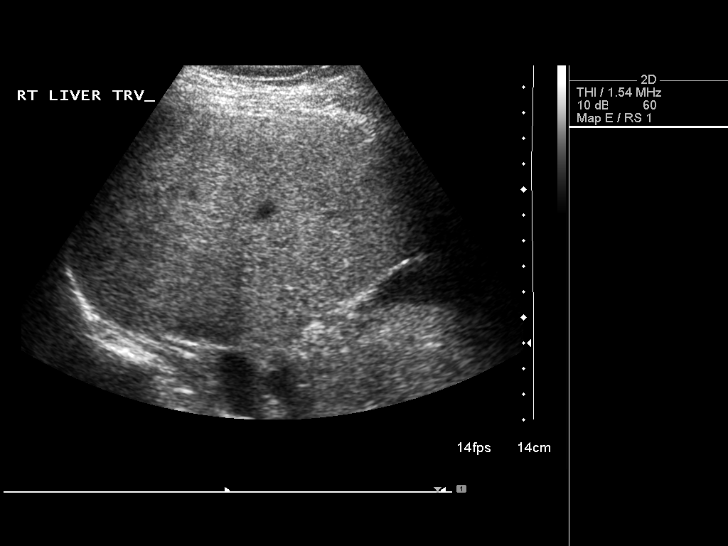
[im 39/84]
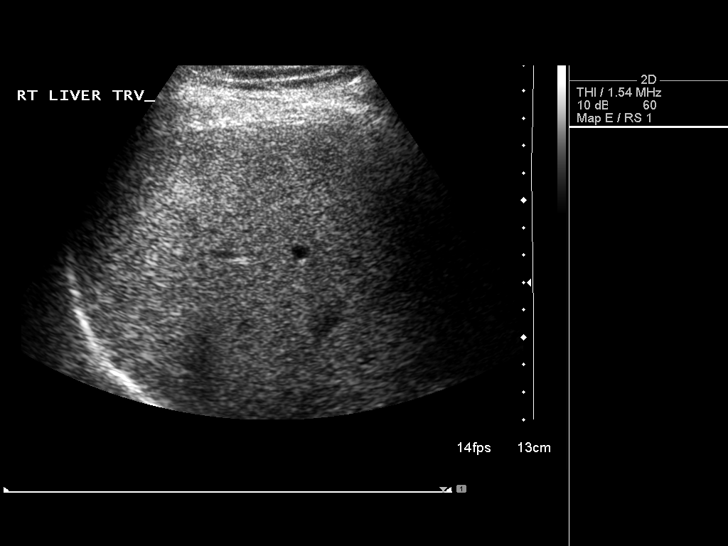
[im 45/84]
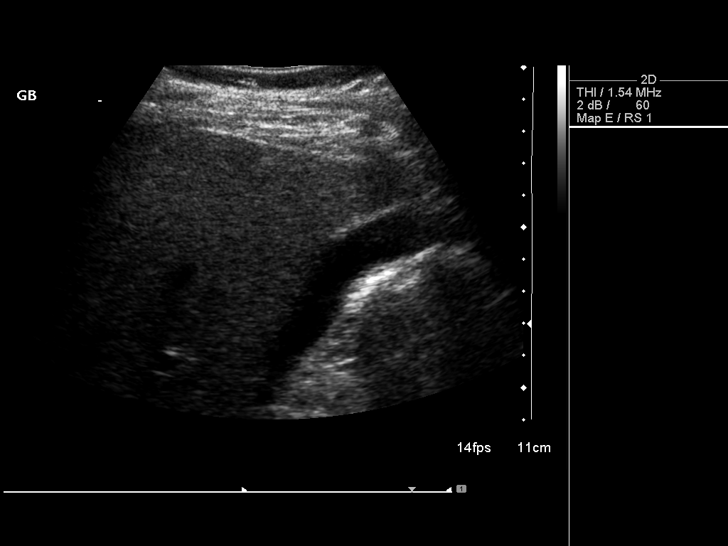
[im 52/84]
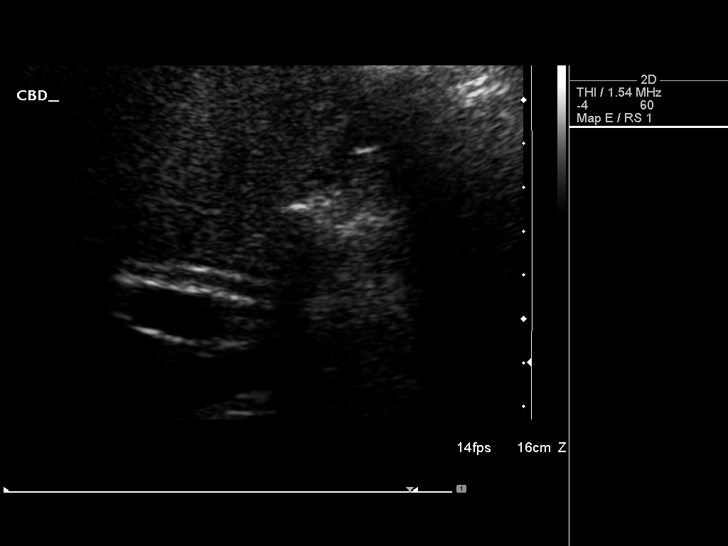
[im 56/84]
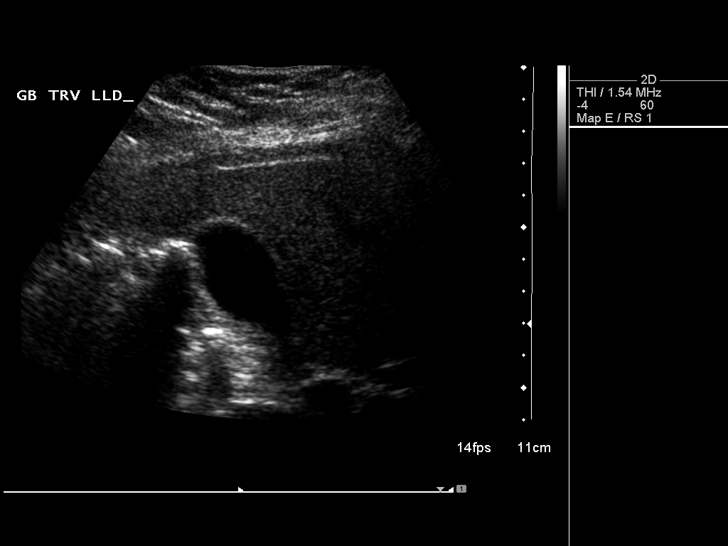
[im 63/84]
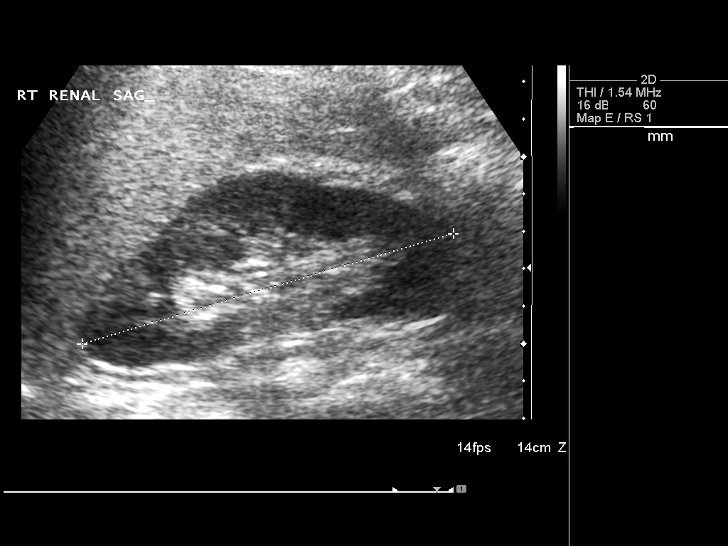
[im 70/84]
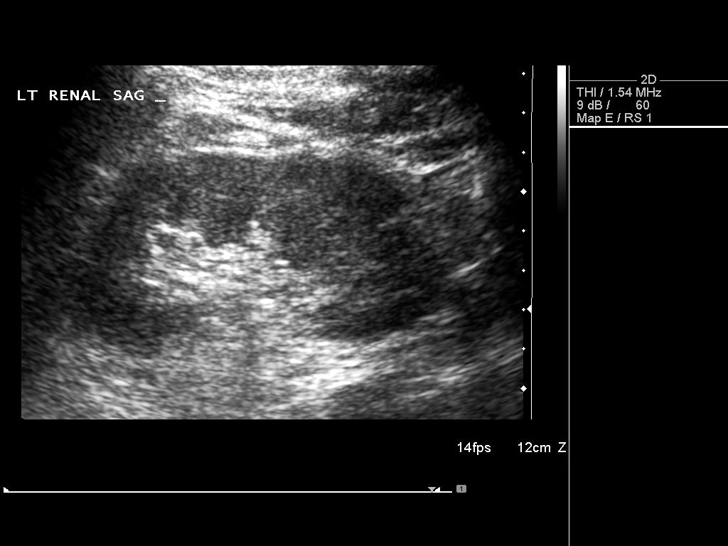
[im 77/84]
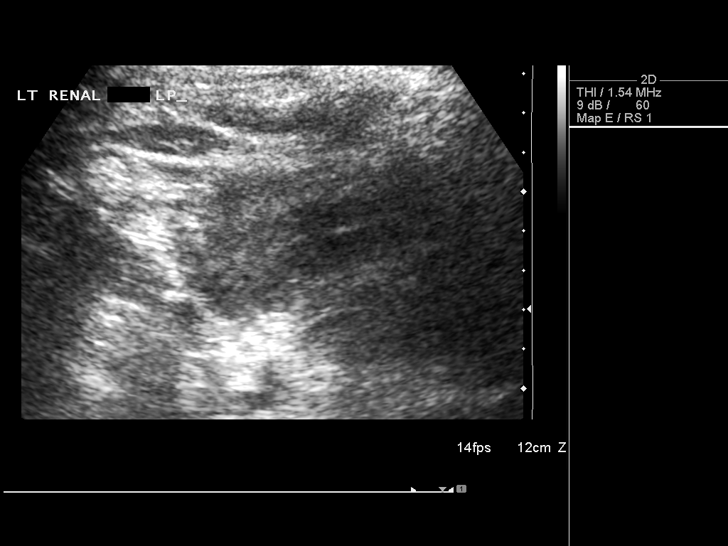
[im 84/84]
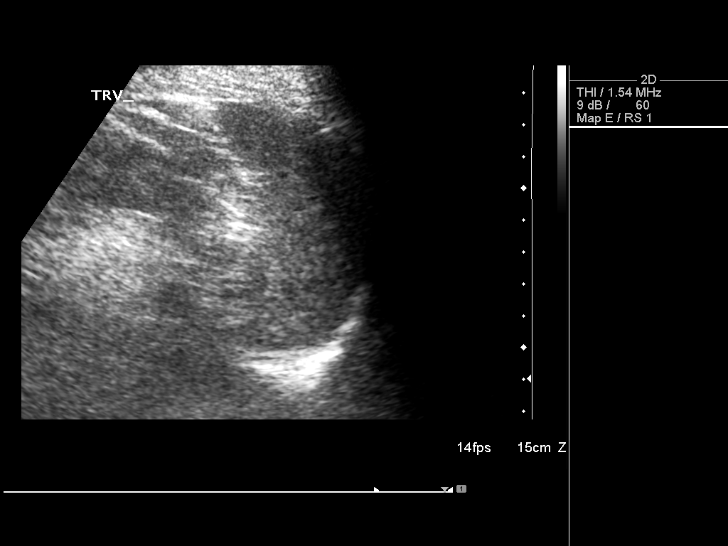

[14 of 25 positions shown; findings below may reference images not displayed]

FINDINGS: Gallbladder: Normally distended without stones or wall thickening.

No pericholecystic fluid or sonographic Murphy sign.

Common bile duct: Diameter: Normal caliber 5 mm diameter

Liver: Echogenic, likely fatty infiltration, though this can be seen
with cirrhosis and certain infiltrative disorders. No focal hepatic
mass or nodularity. Hepatopetal portal venous flow.

IVC: Normal appearance

Pancreas: Normal appearance

Spleen: Normal appearance, 4.7 cm length

Right Kidney: Length: 10.4 cm. Normal morphology without mass or
hydronephrosis.

Left Kidney: Length: 10.8 cm. Normal morphology without mass or
hydronephrosis.

Abdominal aorta: Normal caliber

Other findings: No free-fluid
IMPRESSION: Probable fatty infiltration of liver as above.

Otherwise normal exam.

## 2015-10-17 ENCOUNTER — Other Ambulatory Visit: Payer: Self-pay

## 2015-10-18 ENCOUNTER — Ambulatory Visit: Payer: BLUE CROSS/BLUE SHIELD | Admitting: Internal Medicine

## 2015-10-20 DIAGNOSIS — M545 Low back pain: Secondary | ICD-10-CM | POA: Diagnosis not present

## 2015-10-22 ENCOUNTER — Telehealth: Payer: Self-pay | Admitting: Geriatric Medicine

## 2015-10-22 NOTE — Telephone Encounter (Signed)
Left message informing patient of the message below.

## 2015-10-22 NOTE — Telephone Encounter (Signed)
Patient would like a refill of her hydrocodone. Please advise, thanks.

## 2015-10-22 NOTE — Telephone Encounter (Signed)
We have never filled hydrocodone for her and she would need to contact the doctor that is prescribing it or needs visit.

## 2015-11-01 ENCOUNTER — Telehealth: Payer: Self-pay | Admitting: *Deleted

## 2015-11-01 ENCOUNTER — Other Ambulatory Visit: Payer: Self-pay | Admitting: Family

## 2015-11-01 MED ORDER — PREGABALIN 200 MG PO CAPS: 200.0000 mg | ORAL_CAPSULE | Freq: Two times a day (BID) | ORAL | Status: DC

## 2015-11-01 NOTE — Telephone Encounter (Signed)
Printed and signed.  

## 2015-11-01 NOTE — Telephone Encounter (Signed)
Left msg on triage yesterday afternoon requesting refill on her Lyrica...Patricia Hart/lmb

## 2015-11-01 NOTE — Telephone Encounter (Signed)
Called pt no answer LMOM rx faxed to [facility].../lmb 

## 2015-11-04 DIAGNOSIS — R229 Localized swelling, mass and lump, unspecified: Secondary | ICD-10-CM | POA: Diagnosis not present

## 2015-11-04 DIAGNOSIS — D485 Neoplasm of uncertain behavior of skin: Secondary | ICD-10-CM | POA: Diagnosis not present

## 2015-11-04 DIAGNOSIS — L570 Actinic keratosis: Secondary | ICD-10-CM | POA: Diagnosis not present

## 2015-11-04 DIAGNOSIS — Z86018 Personal history of other benign neoplasm: Secondary | ICD-10-CM | POA: Diagnosis not present

## 2015-11-04 DIAGNOSIS — D225 Melanocytic nevi of trunk: Secondary | ICD-10-CM | POA: Diagnosis not present

## 2015-11-20 DIAGNOSIS — M545 Low back pain: Secondary | ICD-10-CM | POA: Diagnosis not present

## 2015-12-12 ENCOUNTER — Encounter: Payer: Self-pay | Admitting: Vascular Surgery

## 2015-12-17 ENCOUNTER — Ambulatory Visit (INDEPENDENT_AMBULATORY_CARE_PROVIDER_SITE_OTHER): Payer: BLUE CROSS/BLUE SHIELD | Admitting: Vascular Surgery

## 2015-12-17 ENCOUNTER — Encounter: Payer: Self-pay | Admitting: Vascular Surgery

## 2015-12-17 VITALS — BP 119/81 | HR 66 | Temp 98.3°F | Resp 16 | Ht 64.0 in | Wt 149.0 lb

## 2015-12-17 DIAGNOSIS — M5137 Other intervertebral disc degeneration, lumbosacral region: Secondary | ICD-10-CM

## 2015-12-17 NOTE — Progress Notes (Signed)
[facility]  Patient name: Patricia Hart MRN: [id number] DOB: [birth year] Sex: female  REASON FOR CONSULT: Discuss anterior exposure for L5-S1 lumbar disc surgery  HPI: Patricia Hart is a 55 y.o. female, who is seen today for discussion of anterior exposure for L5-S1 disc surgery. She is a very pleasant female with progressive degenerative disc disease. She has been seen by Dr. Marikay Alar who is recommended spine surgery from an anterior exposed. She does have a past history of DVT and pulmonary embolism related to pregnancy. This was over 20 years ago with no difficulty since then. She did have a difficult time with delivery with vaginal tear and no significant bleeding during that time. Her only abdominal surgery has been like post suction with no intraperitoneal surgery.  Past Medical History:  Diagnosis Date  . Blood transfusion without reported diagnosis 1997  . Clotting disorder (HCC) 1997   pregnancy induced dvt- no problems since then  . GERD (gastroesophageal reflux disease)   . History of DVT (deep vein thrombosis)    during pregnancy  . History of pulmonary embolism    occurred during pregnancy  . Overactive bladder     Family History  Problem Relation Age of Onset  . Hyperlipidemia Mother   . Cancer Mother     colon; lung  . Diabetes Father   . Hyperlipidemia Father   . Hypertension Father   . Breast cancer Maternal Aunt     50's  . Osteoporosis Mother     SOCIAL HISTORY: Social History   Social History  . Marital status: Divorced    Spouse name: N/A  . Number of children: N/A  . Years of education: N/A   Occupational History  . Not on file.   Social History Main Topics  . Smoking status: Never Smoker  . Smokeless tobacco: Not on file  . Alcohol use 1.2 oz/week    2 Standard drinks or equivalent per week     Comment: 0-4 drinks/wk  . Drug use: No  . Sexual activity: Not Currently   Partners: Male    Birth control/ protection: Surgical     Comment: Ablation   Other Topics Concern  . Not on file   Social History Narrative  . No narrative on file    Allergies  Allergen Reactions  . Latex Other (See Comments)    Skin irritation    Current Outpatient Prescriptions  Medication Sig Dispense Refill  . acetaminophen (TYLENOL) 500 MG tablet Take 1,500 mg by mouth every 6 (six) hours as needed for mild pain.    Marland Kitchen ibuprofen (ADVIL,MOTRIN) 200 MG tablet Take 400 mg by mouth every 6 (six) hours as needed for moderate pain.    . meloxicam (MOBIC) 15 MG tablet Take 1 tablet by mouth every evening.   0  . omeprazole (PRILOSEC) 20 MG capsule Take 20 mg by mouth daily as needed (acid reflux).     . pregabalin (LYRICA) 200 MG capsule Take 1 capsule (200 mg total) by mouth 2 (two) times daily. 60 capsule 3  . tiZANidine (ZANAFLEX) 4 MG tablet Take 4 mg by mouth every 6 (six) hours as needed for muscle spasms.   0  . HYDROcodone-acetaminophen (NORCO) 5-325 MG tablet Take 1 tablet by mouth every 6 (six) hours as needed for moderate pain. (Patient not taking: Reported on 12/17/2015) 30 tablet 0  . oxyCODONE-acetaminophen (PERCOCET/ROXICET) 5-325 MG tablet Take 2 tablets by mouth every 4 (four) hours as  needed for severe pain. (Patient not taking: Reported on 12/17/2015) 10 tablet 0   No current facility-administered medications for this visit.     REVIEW OF SYSTEMS:   denotes positive finding,  denotes negative finding Cardiac  Comments:  Chest pain or chest pressure:     Shortness of breath upon exertion:    Short of breath when lying flat:    Irregular heart rhythm:        Vascular    Pain in calf, thigh, or hip brought on by ambulation:    Pain in feet at night that wakes you up from your sleep:     Blood clot in your veins:    Leg swelling:         Pulmonary    Oxygen at home:    Productive cough:     Wheezing:         Neurologic    Sudden weakness in arms  or legs:     Sudden numbness in arms or legs:     Sudden onset of difficulty speaking or slurred speech:    Temporary loss of vision in one eye:     Problems with dizziness:         Gastrointestinal    Blood in stool:     Vomited blood:         Genitourinary    Burning when urinating:     Blood in urine:        Psychiatric    Major depression:         Hematologic    Bleeding problems:    Problems with blood clotting too easily:        Skin    Rashes or ulcers:        Constitutional    Fever or chills:      PHYSICAL EXAM: Vitals:   12/17/15 1104  BP: 119/81  Pulse: 66  Resp: 16  Temp: 98.3 F (36.8 C)  TempSrc: Oral  SpO2: 99%  Weight: 149 lb (67.6 kg)  Height:  (1.626 m)    GENERAL: The patient is a well-nourished female, in no acute distress. The vital signs are documented above. CARDIAC: There is a regular rate and rhythm.  VASCULAR: 2+ radial and 2+ dorsalis pedis pulses bilaterally PULMONARY: There is good air exchange bilaterally without wheezing or rales. ABDOMEN: Soft and non-tender with normal pitched bowel sounds. No prior abdominal scars MUSCULOSKELETAL: There are no major deformities or cyanosis. NEUROLOGIC: No focal weakness or paresthesias are detected. SKIN: There are no ulcers or rashes noted. PSYCHIATRIC: The patient has a normal affect.    MEDICAL ISSUES: Had long discussion with patient regarding the plan for anterior exposure for L5-S1 disc surgery. Explained the location of the incision from the midline to the left below the level of the umbilicus above the pubic bone. Explain mobilization of intraperitoneal contents to the right. Also mobilization of the left ureter. Explained mobilization of the arterial venous structures overlying the spine. Explain the possibility of injury to all these and also possibility of postoperative deep venous thrombosis. Explained that this become a very common treatment for degenerative disc disease with  very satisfactory outcomes. All of her questions were answered and she wishes to proceed as scheduled.   Larina Earthly, MD FACS [facility] Tel [phone] Pager [phone]

## 2015-12-20 DIAGNOSIS — M545 Low back pain: Secondary | ICD-10-CM | POA: Diagnosis not present

## 2016-01-06 ENCOUNTER — Telehealth: Payer: Self-pay | Admitting: Vascular Surgery

## 2016-01-06 NOTE — Telephone Encounter (Signed)
Sched appt 9/13 at 1:00. Lm on hm# to inform pt.

## 2016-01-06 NOTE — Telephone Encounter (Signed)
-----   Message from Phillips Odorarol S Pullins, RN sent at 01/03/2016 10:57 AM EDT ----- Regarding: needs consult sched. with Dr. Arbie CookeyEarly Please schedule for new pt. Consult with [facility] prior to 9/27; [facility] sched; Dr. Arbie CookeyEarly assisting Dr. Yetta BarreJones)  Remind pt. To bring copy of LS spine films to appt.

## 2016-01-17 ENCOUNTER — Other Ambulatory Visit: Payer: Self-pay

## 2016-01-20 DIAGNOSIS — M545 Low back pain: Secondary | ICD-10-CM | POA: Diagnosis not present

## 2016-01-21 DIAGNOSIS — M5137 Other intervertebral disc degeneration, lumbosacral region: Secondary | ICD-10-CM | POA: Diagnosis not present

## 2016-01-24 DIAGNOSIS — M67912 Unspecified disorder of synovium and tendon, left shoulder: Secondary | ICD-10-CM | POA: Diagnosis not present

## 2016-01-24 DIAGNOSIS — M1812 Unilateral primary osteoarthritis of first carpometacarpal joint, left hand: Secondary | ICD-10-CM | POA: Diagnosis not present

## 2016-01-30 ENCOUNTER — Encounter: Payer: Self-pay | Admitting: Vascular Surgery

## 2016-02-05 ENCOUNTER — Encounter: Payer: BLUE CROSS/BLUE SHIELD | Admitting: Vascular Surgery

## 2016-02-12 ENCOUNTER — Other Ambulatory Visit: Payer: Self-pay

## 2016-02-12 ENCOUNTER — Encounter (HOSPITAL_COMMUNITY): Payer: Self-pay

## 2016-02-12 ENCOUNTER — Encounter (HOSPITAL_COMMUNITY)
Admission: RE | Admit: 2016-02-12 | Discharge: 2016-02-12 | Disposition: A | Discharge status: Home / Self Care | Payer: BLUE CROSS/BLUE SHIELD | Source: Ambulatory Visit | Attending: Neurological Surgery | Admitting: Neurological Surgery

## 2016-02-12 ENCOUNTER — Other Ambulatory Visit (HOSPITAL_COMMUNITY): Payer: Self-pay | Admitting: *Deleted

## 2016-02-12 DIAGNOSIS — Z01812 Encounter for preprocedural laboratory examination: Secondary | ICD-10-CM | POA: Insufficient documentation

## 2016-02-12 DIAGNOSIS — M1990 Unspecified osteoarthritis, unspecified site: Secondary | ICD-10-CM | POA: Insufficient documentation

## 2016-02-12 DIAGNOSIS — Z86711 Personal history of pulmonary embolism: Secondary | ICD-10-CM | POA: Diagnosis not present

## 2016-02-12 DIAGNOSIS — Z86718 Personal history of other venous thrombosis and embolism: Secondary | ICD-10-CM | POA: Diagnosis not present

## 2016-02-12 DIAGNOSIS — K219 Gastro-esophageal reflux disease without esophagitis: Secondary | ICD-10-CM | POA: Insufficient documentation

## 2016-02-12 DIAGNOSIS — M47896 Other spondylosis, lumbar region: Secondary | ICD-10-CM | POA: Insufficient documentation

## 2016-02-12 DIAGNOSIS — N3281 Overactive bladder: Secondary | ICD-10-CM | POA: Insufficient documentation

## 2016-02-12 DIAGNOSIS — M797 Fibromyalgia: Secondary | ICD-10-CM | POA: Insufficient documentation

## 2016-02-12 HISTORY — DX: Fibromyalgia: M79.7

## 2016-02-12 HISTORY — DX: Unspecified osteoarthritis, unspecified site: M19.90

## 2016-02-12 LAB — CBC
HEMATOCRIT: 37.9 % (ref 36.0–46.0)
HEMOGLOBIN: 12.5 g/dL (ref 12.0–15.0)
MCH: 30.6 pg (ref 26.0–34.0)
MCHC: 33 g/dL (ref 30.0–36.0)
MCV: 92.7 fL (ref 78.0–100.0)
Platelets: 242 10*3/uL (ref 150–400)
RBC: 4.09 MIL/uL (ref 3.87–5.11)
RDW: 13.4 % (ref 11.5–15.5)
WBC: 7.2 10*3/uL (ref 4.0–10.5)

## 2016-02-12 LAB — TYPE AND SCREEN
ABO/RH(D): A NEG
Antibody Screen: NEGATIVE

## 2016-02-12 LAB — BASIC METABOLIC PANEL
ANION GAP: 9 (ref 5–15)
BUN: 13 mg/dL (ref 6–20)
CALCIUM: 9.3 mg/dL (ref 8.9–10.3)
CO2: 21 mmol/L — AB (ref 22–32)
Chloride: 108 mmol/L (ref 101–111)
Creatinine, Ser: 0.8 mg/dL (ref 0.44–1.00)
GFR calc Af Amer: >60 mL/min (ref >60)
GFR calc non Af Amer: >60 mL/min (ref >60)
GLUCOSE: 95 mg/dL (ref 65–99)
Potassium: 4.1 mmol/L (ref 3.5–5.1)
Sodium: 138 mmol/L (ref 135–145)

## 2016-02-12 LAB — ABO/RH: ABO/RH(D): A NEG

## 2016-02-12 LAB — SURGICAL PCR SCREEN
MRSA, PCR: NEGATIVE
Staphylococcus aureus: NEGATIVE

## 2016-02-12 MED ORDER — CHLORHEXIDINE GLUCONATE 4 % EX LIQD: 60.0000 mL | Freq: Once | CUTANEOUS | Status: DC

## 2016-02-12 NOTE — Progress Notes (Signed)
PCP - Hillard DankerElizabeth Crawford, MD Cardiologist - denies  Chest x-ray - not needed EKG - 02/12/16 Stress Test - denies ECHO - denies Cardiac Cath - denies  No orders from Dr. Yetta BarreJones office, Office was called for orders     Patient denies shortness of breath, fever, cough and chest pain at PAT appointment

## 2016-02-12 NOTE — Pre-Procedure Instructions (Signed)
Neysa BonitoLaura Crihfield  02/12/2016     Your procedure is scheduled on Wednesday, February 19, 2016 at 8:30 AM.   [facility] at 6:30 AM.   Call this number if you have problems the morning of surgery: [phone]   Questions prior to day of surgery, please call [phone] between 8 & 4 PM.   Remember:  Do not eat food or drink liquids after midnight Tuesday, 02/18/16.  Take these medicines the morning of surgery with A SIP OF WATER: Pregabalin (Lyrica), Omeprazole (Prilosec) - if needed, Hydrocodone - if needed  Stop NSAIDS (Meloxicam, Ibuprofen, Aleve, etc.) as of today. Do not use Aspirin products prior to surgery.   Do not wear jewelry, make-up or nail polish.  Do not wear lotions, powders, or perfumes.  Do not shave 48 hours prior to surgery.   Do not bring valuables to the hospital.  [facility] is not responsible for any belongings or valuables.  Contacts, dentures or bridgework may not be worn into surgery.  Leave your suitcase in the car.  After surgery it may be brought to your room.  For patients admitted to the hospital, discharge time will be determined by your treatment team.  Special instructions:  [facility] - Preparing for Surgery  Before surgery, you can play an important role.  Because skin is not sterile, your skin needs to be as free of germs as possible.  You can reduce the number of germs on you skin by washing with CHG (chlorahexidine gluconate) soap before surgery.  CHG is an antiseptic cleaner which kills germs and bonds with the skin to continue killing germs even after washing.  Please DO NOT use if you have an allergy to CHG or antibacterial soaps.  If your skin becomes reddened/irritated stop using the CHG and inform your nurse when you arrive at Short Stay.  Do not shave (including legs and underarms) for at least 48 hours prior to the first CHG shower.  You may shave your face.  Please follow these  instructions carefully:   1.  Shower with CHG Soap the night before surgery and the                    morning of Surgery.  2.  If you choose to wash your hair, wash your hair first as usual with your       normal shampoo.  3.  After you shampoo, rinse your hair and body thoroughly to remove the shampoo.  4.  Use CHG as you would any other liquid soap.  You can apply chg directly       to the skin and wash gently with scrungie or a clean washcloth.  5.  Apply the CHG Soap to your body ONLY FROM THE NECK DOWN.        Do not use on open wounds or open sores.  Avoid contact with your eyes, ears, mouth and genitals (private parts).  Wash genitals (private parts) with your normal soap.  6.  Wash thoroughly, paying special attention to the area where your surgery        will be performed.  7.  Thoroughly rinse your body with warm water from the neck down.  8.  DO NOT shower/wash with your normal soap after using and rinsing off       the CHG Soap.  9.  Pat yourself dry with a clean towel.  10.  Wear clean pajamas.            11.  Place clean sheets on your bed the night of your first shower and do not        sleep with pets.  Day of Surgery  Do not apply any lotions the morning of surgery.  Please wear clean clothes to the hospital.   Please read over the following fact sheets that you were given. Pain Booklet, Coughing and Deep Breathing, MRSA Information and Surgical Site Infection Prevention

## 2016-02-19 ENCOUNTER — Inpatient Hospital Stay (HOSPITAL_COMMUNITY): Payer: BLUE CROSS/BLUE SHIELD | Admitting: Critical Care Medicine

## 2016-02-19 ENCOUNTER — Inpatient Hospital Stay (HOSPITAL_COMMUNITY)
Admission: RE | Admit: 2016-02-19 | Discharge: 2016-02-20 | DRG: 460 | Disposition: A | Discharge status: Home / Self Care | Payer: BLUE CROSS/BLUE SHIELD | Source: Ambulatory Visit | Attending: Neurological Surgery | Admitting: Neurological Surgery

## 2016-02-19 ENCOUNTER — Encounter (HOSPITAL_COMMUNITY)
Admission: RE | Disposition: A | Discharge status: Home / Self Care | Payer: Self-pay | Source: Ambulatory Visit | Attending: Neurological Surgery

## 2016-02-19 ENCOUNTER — Inpatient Hospital Stay (HOSPITAL_COMMUNITY): Payer: BLUE CROSS/BLUE SHIELD

## 2016-02-19 ENCOUNTER — Encounter (HOSPITAL_COMMUNITY): Payer: Self-pay | Admitting: Certified Registered Nurse Anesthetist

## 2016-02-19 DIAGNOSIS — Z9882 Breast implant status: Secondary | ICD-10-CM

## 2016-02-19 DIAGNOSIS — M1990 Unspecified osteoarthritis, unspecified site: Secondary | ICD-10-CM | POA: Diagnosis not present

## 2016-02-19 DIAGNOSIS — Z419 Encounter for procedure for purposes other than remedying health state, unspecified: Secondary | ICD-10-CM

## 2016-02-19 DIAGNOSIS — M797 Fibromyalgia: Secondary | ICD-10-CM | POA: Diagnosis present

## 2016-02-19 DIAGNOSIS — Z86711 Personal history of pulmonary embolism: Secondary | ICD-10-CM | POA: Diagnosis not present

## 2016-02-19 DIAGNOSIS — M5136 Other intervertebral disc degeneration, lumbar region: Secondary | ICD-10-CM | POA: Diagnosis not present

## 2016-02-19 DIAGNOSIS — Z791 Long term (current) use of non-steroidal anti-inflammatories (NSAID): Secondary | ICD-10-CM

## 2016-02-19 DIAGNOSIS — M5137 Other intervertebral disc degeneration, lumbosacral region: Principal | ICD-10-CM | POA: Diagnosis present

## 2016-02-19 DIAGNOSIS — Z86718 Personal history of other venous thrombosis and embolism: Secondary | ICD-10-CM | POA: Diagnosis not present

## 2016-02-19 DIAGNOSIS — M545 Low back pain: Secondary | ICD-10-CM | POA: Diagnosis not present

## 2016-02-19 DIAGNOSIS — Z79899 Other long term (current) drug therapy: Secondary | ICD-10-CM | POA: Diagnosis not present

## 2016-02-19 DIAGNOSIS — M4807 Spinal stenosis, lumbosacral region: Secondary | ICD-10-CM | POA: Diagnosis present

## 2016-02-19 DIAGNOSIS — K219 Gastro-esophageal reflux disease without esophagitis: Secondary | ICD-10-CM | POA: Diagnosis present

## 2016-02-19 DIAGNOSIS — M4326 Fusion of spine, lumbar region: Secondary | ICD-10-CM | POA: Diagnosis not present

## 2016-02-19 DIAGNOSIS — Z981 Arthrodesis status: Secondary | ICD-10-CM

## 2016-02-19 HISTORY — PX: ABDOMINAL EXPOSURE: SHX5708

## 2016-02-19 HISTORY — PX: ANTERIOR LUMBAR FUSION: SHX1170

## 2016-02-19 SURGERY — ANTERIOR LUMBAR FUSION 1 LEVEL
Anesthesia: General

## 2016-02-19 MED ORDER — MIDAZOLAM HCL 5 MG/5ML IJ SOLN
INTRAMUSCULAR | Status: DC | PRN
  Administered 2016-02-19: 2 mg via INTRAVENOUS

## 2016-02-19 MED ORDER — MENTHOL 3 MG MT LOZG: 1.0000 | LOZENGE | OROMUCOSAL | Status: DC | PRN

## 2016-02-19 MED ORDER — SUGAMMADEX SODIUM 200 MG/2ML IV SOLN
INTRAVENOUS | Status: DC | PRN
  Administered 2016-02-19: 200 mg via INTRAVENOUS

## 2016-02-19 MED ORDER — DIPHENHYDRAMINE HCL 50 MG/ML IJ SOLN
INTRAMUSCULAR | Status: DC | PRN
  Administered 2016-02-19: 25 mg via INTRAVENOUS

## 2016-02-19 MED ORDER — PROMETHAZINE HCL 25 MG/ML IJ SOLN
6.2500 mg | INTRAMUSCULAR | Status: DC | PRN
  Administered 2016-02-19: 6.25 mg via INTRAVENOUS

## 2016-02-19 MED ORDER — LIDOCAINE HCL (CARDIAC) 20 MG/ML IV SOLN
INTRAVENOUS | Status: DC | PRN
  Administered 2016-02-19: 100 mg via INTRAVENOUS

## 2016-02-19 MED ORDER — SODIUM CHLORIDE 0.9% FLUSH: 3.0000 mL | INTRAVENOUS | Status: DC | PRN

## 2016-02-19 MED ORDER — PHENOL 1.4 % MT LIQD
1.0000 | OROMUCOSAL | Status: DC | PRN
Start: 2016-02-19 — End: 2016-02-20

## 2016-02-19 MED ORDER — VANCOMYCIN HCL 1000 MG IV SOLR
INTRAVENOUS | Status: AC
  Filled 2016-02-19: qty 1000

## 2016-02-19 MED ORDER — SODIUM CHLORIDE 0.9 % IV SOLN: 250.0000 mL | INTRAVENOUS | Status: DC

## 2016-02-19 MED ORDER — MIDAZOLAM HCL 2 MG/2ML IJ SOLN
INTRAMUSCULAR | Status: AC
  Filled 2016-02-19: qty 2

## 2016-02-19 MED ORDER — GELATIN ABSORBABLE MT POWD
OROMUCOSAL | Status: DC | PRN
  Administered 2016-02-19: 10:00:00 via TOPICAL

## 2016-02-19 MED ORDER — 0.9 % SODIUM CHLORIDE (POUR BTL) OPTIME
TOPICAL | Status: DC | PRN
  Administered 2016-02-19: 1000 mL

## 2016-02-19 MED ORDER — ONDANSETRON HCL 4 MG/2ML IJ SOLN
INTRAMUSCULAR | Status: DC | PRN
  Administered 2016-02-19: 4 mg via INTRAVENOUS

## 2016-02-19 MED ORDER — FENTANYL CITRATE (PF) 100 MCG/2ML IJ SOLN
INTRAMUSCULAR | Status: AC
  Filled 2016-02-19: qty 4

## 2016-02-19 MED ORDER — METHOCARBAMOL 500 MG PO TABS
ORAL_TABLET | ORAL | Status: AC
  Filled 2016-02-19: qty 1

## 2016-02-19 MED ORDER — CELECOXIB 200 MG PO CAPS
200.0000 mg | ORAL_CAPSULE | Freq: Two times a day (BID) | ORAL | Status: DC
  Administered 2016-02-19 – 2016-02-20 (×3): 200 mg via ORAL
  Filled 2016-02-19 (×3): qty 1

## 2016-02-19 MED ORDER — SUGAMMADEX SODIUM 200 MG/2ML IV SOLN
INTRAVENOUS | Status: AC
  Filled 2016-02-19: qty 2

## 2016-02-19 MED ORDER — SODIUM CHLORIDE 0.9 % IR SOLN
Status: DC | PRN
  Administered 2016-02-19: 10:00:00

## 2016-02-19 MED ORDER — PREGABALIN 50 MG PO CAPS
200.0000 mg | ORAL_CAPSULE | Freq: Two times a day (BID) | ORAL | Status: DC
  Administered 2016-02-19 – 2016-02-20 (×2): 200 mg via ORAL
  Filled 2016-02-19 (×3): qty 4

## 2016-02-19 MED ORDER — THROMBIN 20000 UNITS EX SOLR
CUTANEOUS | Status: DC | PRN
  Administered 2016-02-19: 10:00:00 via TOPICAL

## 2016-02-19 MED ORDER — FENTANYL CITRATE (PF) 100 MCG/2ML IJ SOLN
INTRAMUSCULAR | Status: DC | PRN
  Administered 2016-02-19: 100 ug via INTRAVENOUS
  Administered 2016-02-19 (×2): 50 ug via INTRAVENOUS

## 2016-02-19 MED ORDER — OXYCODONE HCL 5 MG PO TABS: 5.0000 mg | ORAL_TABLET | Freq: Once | ORAL | Status: DC | PRN

## 2016-02-19 MED ORDER — POTASSIUM CHLORIDE IN NACL 20-0.9 MEQ/L-% IV SOLN: INTRAVENOUS | Status: DC

## 2016-02-19 MED ORDER — PROPOFOL 10 MG/ML IV BOLUS
INTRAVENOUS | Status: AC
  Filled 2016-02-19: qty 40

## 2016-02-19 MED ORDER — DEXAMETHASONE SODIUM PHOSPHATE 10 MG/ML IJ SOLN
INTRAMUSCULAR | Status: AC
  Filled 2016-02-19: qty 1

## 2016-02-19 MED ORDER — LACTATED RINGERS IV SOLN
INTRAVENOUS | Status: DC | PRN
  Administered 2016-02-19 (×2): via INTRAVENOUS

## 2016-02-19 MED ORDER — DIPHENHYDRAMINE HCL 50 MG/ML IJ SOLN
INTRAMUSCULAR | Status: AC
  Filled 2016-02-19: qty 1

## 2016-02-19 MED ORDER — ACETAMINOPHEN 650 MG RE SUPP: 650.0000 mg | RECTAL | Status: DC | PRN

## 2016-02-19 MED ORDER — ACETAMINOPHEN 325 MG PO TABS: 650.0000 mg | ORAL_TABLET | ORAL | Status: DC | PRN

## 2016-02-19 MED ORDER — PROPOFOL 10 MG/ML IV BOLUS
INTRAVENOUS | Status: DC | PRN
  Administered 2016-02-19: 200 mg via INTRAVENOUS

## 2016-02-19 MED ORDER — OXYCODONE HCL 5 MG/5ML PO SOLN: 5.0000 mg | Freq: Once | ORAL | Status: DC | PRN

## 2016-02-19 MED ORDER — CEFAZOLIN SODIUM 1 G IJ SOLR
INTRAMUSCULAR | Status: DC | PRN
  Administered 2016-02-19: 2 g via INTRAMUSCULAR

## 2016-02-19 MED ORDER — PROMETHAZINE HCL 25 MG/ML IJ SOLN
INTRAMUSCULAR | Status: AC
  Administered 2016-02-19: 6.25 mg via INTRAVENOUS
  Filled 2016-02-19: qty 1

## 2016-02-19 MED ORDER — CEFAZOLIN IN D5W 1 GM/50ML IV SOLN
1.0000 g | Freq: Three times a day (TID) | INTRAVENOUS | Status: AC
  Administered 2016-02-19 (×2): 1 g via INTRAVENOUS
  Filled 2016-02-19 (×2): qty 50

## 2016-02-19 MED ORDER — FENTANYL CITRATE (PF) 100 MCG/2ML IJ SOLN
INTRAMUSCULAR | Status: AC
  Filled 2016-02-19: qty 2

## 2016-02-19 MED ORDER — DEXAMETHASONE SODIUM PHOSPHATE 10 MG/ML IJ SOLN
10.0000 mg | INTRAMUSCULAR | Status: AC
  Administered 2016-02-19: 10 mg via INTRAVENOUS

## 2016-02-19 MED ORDER — MORPHINE SULFATE (PF) 2 MG/ML IV SOLN
1.0000 mg | INTRAVENOUS | Status: DC | PRN
  Administered 2016-02-19: 4 mg via INTRAVENOUS
  Administered 2016-02-19: 2 mg via INTRAVENOUS
  Filled 2016-02-19: qty 1
  Filled 2016-02-19: qty 2

## 2016-02-19 MED ORDER — SODIUM CHLORIDE 0.9% FLUSH
3.0000 mL | Freq: Two times a day (BID) | INTRAVENOUS | Status: DC
  Administered 2016-02-19: 3 mL via INTRAVENOUS

## 2016-02-19 MED ORDER — METHOCARBAMOL 500 MG PO TABS
500.0000 mg | ORAL_TABLET | Freq: Four times a day (QID) | ORAL | Status: DC | PRN
  Administered 2016-02-19 (×2): 500 mg via ORAL
  Filled 2016-02-19: qty 1

## 2016-02-19 MED ORDER — ROCURONIUM BROMIDE 100 MG/10ML IV SOLN
INTRAVENOUS | Status: DC | PRN
  Administered 2016-02-19: 60 mg via INTRAVENOUS
  Administered 2016-02-19: 20 mg via INTRAVENOUS
  Administered 2016-02-19: 10 mg via INTRAVENOUS
  Administered 2016-02-19: 20 mg via INTRAVENOUS

## 2016-02-19 MED ORDER — OXYCODONE-ACETAMINOPHEN 5-325 MG PO TABS
ORAL_TABLET | ORAL | Status: AC
  Filled 2016-02-19: qty 2

## 2016-02-19 MED ORDER — ONDANSETRON HCL 4 MG/2ML IJ SOLN: 4.0000 mg | INTRAMUSCULAR | Status: DC | PRN

## 2016-02-19 MED ORDER — HYDROMORPHONE HCL 1 MG/ML IJ SOLN
INTRAMUSCULAR | Status: AC
  Administered 2016-02-19: 0.5 mg via INTRAVENOUS
  Filled 2016-02-19: qty 1

## 2016-02-19 MED ORDER — ROCURONIUM BROMIDE 10 MG/ML (PF) SYRINGE
PREFILLED_SYRINGE | INTRAVENOUS | Status: AC
  Filled 2016-02-19: qty 20

## 2016-02-19 MED ORDER — HYDROMORPHONE HCL 1 MG/ML IJ SOLN
0.2500 mg | INTRAMUSCULAR | Status: DC | PRN
  Administered 2016-02-19 (×2): 0.5 mg via INTRAVENOUS

## 2016-02-19 MED ORDER — METHOCARBAMOL 1000 MG/10ML IJ SOLN
500.0000 mg | Freq: Four times a day (QID) | INTRAVENOUS | Status: DC | PRN
  Filled 2016-02-19: qty 5

## 2016-02-19 MED ORDER — PHENYLEPHRINE HCL 10 MG/ML IJ SOLN
INTRAVENOUS | Status: DC | PRN
  Administered 2016-02-19: 25 ug/min via INTRAVENOUS

## 2016-02-19 MED ORDER — OXYCODONE-ACETAMINOPHEN 5-325 MG PO TABS
1.0000 | ORAL_TABLET | ORAL | Status: DC | PRN
  Administered 2016-02-19 (×3): 2 via ORAL
  Filled 2016-02-19 (×2): qty 2

## 2016-02-19 SURGICAL SUPPLY — 108 items
ADH SKN CLS APL DERMABOND .7 (GAUZE/BANDAGES/DRESSINGS) ×1
APL SKNCLS STERI-STRIP NONHPOA (GAUZE/BANDAGES/DRESSINGS)
APPLIER CLIP 11 MED OPEN (CLIP) ×2
APR CLP MED 11 20 MLT OPN (CLIP) ×1
BAG DECANTER FOR FLEXI CONT (MISCELLANEOUS) ×2 IMPLANT
BENZOIN TINCTURE PRP APPL 2/3 (GAUZE/BANDAGES/DRESSINGS) IMPLANT
BONE MATRIX OSTEOCEL PRO MED (Bone Implant) ×1 IMPLANT
BUR EGG ELITE 5.0 (BURR) ×1 IMPLANT
BUR MATCHSTICK NEURO 3.0 LAGG (BURR) ×1 IMPLANT
CANISTER SUCT 3000ML PPV (MISCELLANEOUS) ×2 IMPLANT
CLIP APPLIE 11 MED OPEN (CLIP) ×1 IMPLANT
COVER BACK TABLE 24X17X13 BIG (DRAPES) IMPLANT
COVER TABLE BACK 60X90 (DRAPES) ×1 IMPLANT
DERMABOND ADVANCED (GAUZE/BANDAGES/DRESSINGS) ×1
DERMABOND ADVANCED .7 DNX12 (GAUZE/BANDAGES/DRESSINGS) ×1 IMPLANT
DRAPE C-ARM 42X72 X-RAY (DRAPES) ×5 IMPLANT
DRAPE INCISE IOBAN 66X45 STRL (DRAPES) ×1 IMPLANT
DRAPE LAPAROTOMY 100X72X124 (DRAPES) ×2 IMPLANT
DRAPE POUCH INSTRU U-SHP 10X18 (DRAPES) ×2 IMPLANT
DRSG OPSITE POSTOP 4X8 (GAUZE/BANDAGES/DRESSINGS) ×1 IMPLANT
DURAPREP 26ML APPLICATOR (WOUND CARE) ×2 IMPLANT
ELECT BLADE 4.0 EZ CLEAN MEGAD (MISCELLANEOUS) ×2
ELECT REM PT RETURN 9FT ADLT (ELECTROSURGICAL) ×2
ELECTRODE BLDE 4.0 EZ CLN MEGD (MISCELLANEOUS) ×1 IMPLANT
ELECTRODE REM PT RTRN 9FT ADLT (ELECTROSURGICAL) ×1 IMPLANT
GAUZE SPONGE 4X4 12PLY STRL (GAUZE/BANDAGES/DRESSINGS) ×1 IMPLANT
GAUZE SPONGE 4X4 16PLY XRAY LF (GAUZE/BANDAGES/DRESSINGS) IMPLANT
GLOVE BIO SURGEON STRL SZ 6.5 (GLOVE) IMPLANT
GLOVE BIO SURGEON STRL SZ7 (GLOVE) IMPLANT
GLOVE BIO SURGEON STRL SZ8 (GLOVE) ×2 IMPLANT
GLOVE BIO SURGEON STRL SZ8.5 (GLOVE) IMPLANT
GLOVE BIOGEL PI IND STRL 7.5 (GLOVE) IMPLANT
GLOVE BIOGEL PI INDICATOR 7.5 (GLOVE) ×1
GLOVE ECLIPSE 6.5 STRL STRAW (GLOVE) IMPLANT
GLOVE ECLIPSE 7.0 STRL STRAW (GLOVE) IMPLANT
GLOVE ECLIPSE 7.5 STRL STRAW (GLOVE) IMPLANT
GLOVE INDICATOR 6.5 STRL GRN (GLOVE) ×1 IMPLANT
GLOVE INDICATOR 7.0 STRL GRN (GLOVE) ×1 IMPLANT
GLOVE INDICATOR 8.0 STRL GRN (GLOVE) IMPLANT
GLOVE OPTIFIT SS 6.5 STRL BRWN (GLOVE) IMPLANT
GLOVE OPTIFIT SS 7.5 STRL LX (GLOVE) IMPLANT
GLOVE OPTIFIT SS 8.0 STRL (GLOVE) IMPLANT
GLOVE OPTIFIT SS 8.5 STRL (GLOVE) IMPLANT
GLOVE SS BIOGEL STRL SZ 7.5 (GLOVE) ×1 IMPLANT
GLOVE SS N UNI LF 7.5 STRL (GLOVE) IMPLANT
GLOVE SUPERSENSE BIOGEL SZ 7.5 (GLOVE)
GLOVE SURG SS PI 6.5 STRL IVOR (GLOVE) ×3 IMPLANT
GLOVE SURG SS PI 7.0 STRL IVOR (GLOVE) IMPLANT
GLOVE SURG SS PI 7.5 STRL IVOR (GLOVE) ×1 IMPLANT
GLOVE SURG SS PI 8.0 STRL IVOR (GLOVE) ×2 IMPLANT
GLOVE SURG SS PI 8.5 STRL IVOR (GLOVE)
GLOVE SURG SS PI 8.5 STRL STRW (GLOVE) IMPLANT
GOWN STRL NON-REIN LRG LVL3 (GOWN DISPOSABLE) ×1 IMPLANT
GOWN STRL REUS W/ TWL LRG LVL3 (GOWN DISPOSABLE) IMPLANT
GOWN STRL REUS W/ TWL XL LVL3 (GOWN DISPOSABLE) IMPLANT
GOWN STRL REUS W/TWL 2XL LVL3 (GOWN DISPOSABLE) ×1 IMPLANT
GOWN STRL REUS W/TWL LRG LVL3 (GOWN DISPOSABLE) ×2
GOWN STRL REUS W/TWL XL LVL3 (GOWN DISPOSABLE) ×6
HEMOSTAT POWDER KIT SURGIFOAM (HEMOSTASIS) ×1 IMPLANT
IMPL BRIG 12X34X24-12 SPINE (Spacer) IMPLANT
IMPLANT BRIG 12X34X24-12 SPINE (Spacer) ×2 IMPLANT
KIT BASIN OR (CUSTOM PROCEDURE TRAY) ×2 IMPLANT
KIT ROOM TURNOVER OR (KITS) ×4 IMPLANT
LOOP VESSEL MAXI BLUE (MISCELLANEOUS) IMPLANT
LOOP VESSEL MINI RED (MISCELLANEOUS) ×1 IMPLANT
NDL SPNL 18GX3.5 QUINCKE PK (NEEDLE) ×1 IMPLANT
NEEDLE SPNL 18GX3.5 QUINCKE PK (NEEDLE) ×2 IMPLANT
NS IRRIG 1000ML POUR BTL (IV SOLUTION) ×2 IMPLANT
PACK LAMINECTOMY NEURO (CUSTOM PROCEDURE TRAY) ×2 IMPLANT
PAD ARMBOARD 7.5X6 YLW CONV (MISCELLANEOUS) ×5 IMPLANT
PIN FIX PLATE BRIGADE 12MM THD (Pin) ×1 IMPLANT
PLATE BRIGADE LORDOTIC 36MM (Plate) ×1 IMPLANT
PUTTY BONE ATTRAX 1CC CYLINDER (Putty) ×1 IMPLANT
SCREW BRIGADE 5X5X25.0MM (Screw) ×3 IMPLANT
SCREW BRIGADE PLATE 5.5X22.5MM (Screw) ×1 IMPLANT
SPONGE INTESTINAL PEANUT (DISPOSABLE) ×4 IMPLANT
SPONGE LAP 18X18 X RAY DECT (DISPOSABLE) ×2 IMPLANT
SPONGE LAP 4X18 X RAY DECT (DISPOSABLE) ×1 IMPLANT
STAPLER VISISTAT 35W (STAPLE) ×1 IMPLANT
STRIP CLOSURE SKIN 1/2X4 (GAUZE/BANDAGES/DRESSINGS) IMPLANT
SUT MNCRL AB 4-0 PS2 18 (SUTURE) ×1 IMPLANT
SUT PROLENE 4 0 RB 1 (SUTURE)
SUT PROLENE 4-0 RB1 .5 CRCL 36 (SUTURE) ×4 IMPLANT
SUT PROLENE 5 0 CC1 (SUTURE) IMPLANT
SUT PROLENE 6 0 C 1 30 (SUTURE) ×1 IMPLANT
SUT PROLENE 6 0 CC (SUTURE) IMPLANT
SUT SILK 0 TIES 10X30 (SUTURE) ×2 IMPLANT
SUT SILK 2 0 TIES 10X30 (SUTURE) ×2 IMPLANT
SUT SILK 2 0SH CR/8 30 (SUTURE) IMPLANT
SUT SILK 3 0 TIES 10X30 (SUTURE) ×1 IMPLANT
SUT SILK 3 0 TIES 17X18 (SUTURE)
SUT SILK 3 0SH CR/8 30 (SUTURE) IMPLANT
SUT SILK 3-0 18XBRD TIE BLK (SUTURE) IMPLANT
SUT VIC AB 0 CT1 18XCR BRD8 (SUTURE) ×1 IMPLANT
SUT VIC AB 0 CT1 27 (SUTURE) ×4
SUT VIC AB 0 CT1 27XBRD ANBCTR (SUTURE) ×3 IMPLANT
SUT VIC AB 0 CT1 8-18 (SUTURE)
SUT VIC AB 2-0 CP2 18 (SUTURE) ×2 IMPLANT
SUT VIC AB 2-0 CT1 36 (SUTURE) ×1 IMPLANT
SUT VIC AB 3-0 SH 27 (SUTURE)
SUT VIC AB 3-0 SH 27X BRD (SUTURE) ×1 IMPLANT
SUT VIC AB 3-0 SH 8-18 (SUTURE) ×3 IMPLANT
SUT VICRYL 4-0 PS2 18IN ABS (SUTURE) ×1 IMPLANT
TOWEL OR 17X24 6PK STRL BLUE (TOWEL DISPOSABLE) ×5 IMPLANT
TOWEL OR 17X26 10 PK STRL BLUE (TOWEL DISPOSABLE) ×3 IMPLANT
TRAY FOLEY BAG SILVER LF 16FR (CATHETERS) ×1 IMPLANT
TRAY FOLEY W/METER SILVER 16FR (SET/KITS/TRAYS/PACK) ×1 IMPLANT
WATER STERILE IRR 1000ML POUR (IV SOLUTION) ×2 IMPLANT

## 2016-02-19 NOTE — Anesthesia Procedure Notes (Signed)
Procedure Name: Intubation Date/Time: 02/19/2016 8:44 AM Performed by: Little IshikawaMERCER, Jhordan Mckibben L Pre-anesthesia Checklist: Patient identified, Emergency Drugs available, Suction available and Patient being monitored Patient Re-evaluated:Patient Re-evaluated prior to inductionOxygen Delivery Method: Circle System Utilized Preoxygenation: Pre-oxygenation with 100% oxygen Intubation Type: IV induction Ventilation: Mask ventilation without difficulty Laryngoscope Size: Mac and 4 Grade View: Grade I Tube type: Oral Tube size: 7.5 mm Number of attempts: 1 Airway Equipment and Method: Stylet Placement Confirmation: ETT inserted through vocal cords under direct vision,  positive ETCO2 and breath sounds checked- equal and bilateral Secured at: 21 cm Tube secured with: Tape Dental Injury: Teeth and Oropharynx as per pre-operative assessment

## 2016-02-19 NOTE — Evaluation (Signed)
Occupational Therapy Evaluation Patient Details Name: Patricia BonitoLaura Hart MRN: [id number] DOB: [birth year] Today's Date: 02/19/2016    History of Present Illness Patient is a 55 y/o female with hx of PE, DVT, fibromyalgia and clotting disorder presents s/p L5-S1 ALIF.   Clinical Impression   Pt reports she was independent with ADL PTA. Currently pt overall supervision for safety with ADL and functional mobility. All back, safety, and ADL education completed. Pt able to recall 2/3 back precautions at start of session but demonstrated ability to maintain during all activities in session. Pt planning to d/c home with 24/7 supervision initially. No further acute OT needs identified; signing off at this time. Please re-consult if needs change. Thank you for this referral.    Follow Up Recommendations  No OT follow up;Supervision - Intermittent    Equipment Recommendations  None recommended by OT    Recommendations for Other Services       Precautions / Restrictions Precautions Precautions: Back Precaution Booklet Issued: No (PT already provided handout) Precaution Comments: Pt able to recall 2/3 back precautions. Reviewed all precautions with pt. Restrictions Weight Bearing Restrictions: No      Mobility Bed Mobility Overal bed mobility: Modified Independent Bed Mobility: Rolling;Sidelying to Sit;Sit to Sidelying Rolling: Modified independent (Device/Increase time) Sidelying to sit: Modified independent (Device/Increase time)     Sit to sidelying: Modified independent (Device/Increase time) General bed mobility comments: Increased time. HOB flat, no use of bed rails. Good technique.  Transfers Overall transfer level: Needs assistance Equipment used: None Transfers: Sit to/from Stand Sit to Stand: Supervision         General transfer comment: Supervision for safety. Mildly unsteady during functional mobility but still at a supervision level.    Balance Overall balance  assessment: Needs assistance Sitting-balance support: Feet supported;No upper extremity supported Sitting balance-Leahy Scale: Good     Standing balance support: No upper extremity supported;During functional activity Standing balance-Leahy Scale: Good                              ADL Overall ADL's : Needs assistance/impaired Eating/Feeding: Set up;Sitting   Grooming: Supervision/safety;Standing Grooming Details (indicate cue type and reason): Educated pt on use of 2 cups for oral care Upper Body Bathing: Set up;Supervision/ safety;Sitting   Lower Body Bathing: Set up;Supervison/ safety;Sit to/from stand   Upper Body Dressing : Set up;Supervision/safety;Sitting   Lower Body Dressing: Set up;Supervision/safety;Sit to/from stand Lower Body Dressing Details (indicate cue type and reason): Pt able to cross foot over opposite knee. Educated pt on compensatory strategies for LB ADL. Toilet Transfer: Supervision/safety;Ambulation;BSC   Toileting- ArchitectClothing Manipulation and Hygiene: Supervision/safety;Sit to/from stand Toileting - Clothing Manipulation Details (indicate cue type and reason): Pt able to perform peri care without twisting. Tub/ Shower Transfer: Supervision/safety;Walk-in shower;Ambulation;Grab bars Tub/Shower Transfer Details (indicate cue type and reason): Recommend supervision for safety with shower transfers initially upon return home. Functional mobility during ADLs: Supervision/safety General ADL Comments: Educated pt on maintaining back precautions during functional activities, log roll technique for bed mobility, keeping frequently used items at counter top height.     Vision Vision Assessment?: No apparent visual deficits   Perception     Praxis      Pertinent Vitals/Pain Pain Assessment: 0-10 Pain Score: 6  Faces Pain Scale: Hurts little more Pain Location: back Pain Descriptors / Indicators: Aching;Sore Pain Intervention(s): Monitored during  session;Premedicated before session;RN gave pain meds during session  Hand Dominance     Extremity/Trunk Assessment Upper Extremity Assessment Upper Extremity Assessment: Overall WFL for tasks assessed   Lower Extremity Assessment Lower Extremity Assessment: Defer to PT evaluation LLE Deficits / Details: Burning left thigh LLE Sensation:  [facility].)   Cervical / Trunk Assessment Cervical / Trunk Assessment: Other exceptions Cervical / Trunk Exceptions: s/p spine surgery   Communication Communication Communication: No difficulties   Cognition Arousal/Alertness: Awake/alert Behavior During Therapy: WFL for tasks assessed/performed Overall Cognitive Status: Within Functional Limits for tasks assessed       Memory: Decreased recall of precautions             General Comments       Exercises       Shoulder Instructions      Home Living Family/patient expects to be discharged to:: Private residence Living Arrangements: Children;Parent Available Help at Discharge: Family;Available 24 hours/day Type of Home: House Home Access: Stairs to enter [employer] of Steps: 4 Entrance Stairs-Rails: Right Home Layout: Two level;Bed/bath upstairs Alternate Level Stairs-Number of Steps: 1 flight Alternate Level Stairs-Rails: Right Bathroom Shower/Tub: Producer, television/film/video: Standard (one handicapped)     Home Equipment: Grab bars - tub/shower          Prior Functioning/Environment Level of Independence: Independent        Comments: Plans on staying at her mother's house or her son will be able to help her at home. Home above is her house setup.        OT Problem List: Decreased strength;Impaired balance (sitting and/or standing);Decreased knowledge of precautions;Pain   OT Treatment/Interventions:      OT Goals(Current goals can be found in the care plan section) Acute Rehab OT Goals Patient Stated Goal: to go home tomorrow OT Goal  Formulation: All assessment and education complete, DC therapy  OT Frequency:     Barriers to D/C:            Co-evaluation              End of Session Nurse Communication: Mobility status  Activity Tolerance: Patient tolerated treatment well Patient left: in bed;with call bell/phone within reach;with SCD's reapplied   Time: 7829-5621 OT Time Calculation (min): 20 min Charges:  OT General Charges $OT Visit: 1 Procedure OT Evaluation $OT Eval Moderate Complexity: 1 Procedure G-Codes:     Gaye Alken M.S., OTR/L Pager: [phone]  02/19/2016, 5:17 PM

## 2016-02-19 NOTE — Transfer of Care (Signed)
Immediate Anesthesia Transfer of Care Note  Patient: Neysa BonitoLaura Bognar  Procedure(s) Performed: Procedure(s): Anterior Lumbar Interbody Fusion  - Lumbar five-sacral one (N/A) ABDOMINAL EXPOSURE (N/A)  Patient Location: PACU  Anesthesia Type:General  Level of Consciousness: awake  Airway & Oxygen Therapy: Patient Spontanous Breathing and Patient connected to nasal cannula oxygen  Post-op Assessment: Report given to RN, Post -op Vital signs reviewed and stable and Patient moving all extremities X 4  Post vital signs: Reviewed and stable  Last Vitals:  Vitals:   02/19/16 0703 02/19/16 1111  BP: 126/80 123/80  Pulse: 73   Resp: 20 (!) 22  Temp: 36.6 C 36.7 C    Last Pain:  Vitals:   02/19/16 0747  TempSrc:   PainSc: 6       Patients Stated Pain Goal: 5 (02/19/16 0747)  Complications: No apparent anesthesia complications

## 2016-02-19 NOTE — Anesthesia Preprocedure Evaluation (Signed)
Anesthesia Evaluation  Patient identified by MRN, date of birth, ID band Patient awake    Reviewed: Allergy & Precautions, NPO status , Patient's Chart, lab work & pertinent test results  Airway Mallampati: I  TM Distance: >3 FB Neck ROM: Full    Dental  (+) Teeth Intact, Dental Advisory Given   Pulmonary    breath sounds clear to auscultation       Cardiovascular  Rhythm:Regular Rate:Normal     Neuro/Psych  Neuromuscular disease    GI/Hepatic GERD  Medicated and Controlled,  Endo/Other    Renal/GU      Musculoskeletal  (+) Arthritis , Fibromyalgia -  Abdominal   Peds  Hematology   Anesthesia Other Findings Pregnancy related DVT with resulting PE in 1997, no issues since related to DVT  Reproductive/Obstetrics                             Anesthesia Physical  Anesthesia Plan  ASA: II  Anesthesia Plan: General   Post-op Pain Management:    Induction: Intravenous  Airway Management Planned: Oral ETT  Additional Equipment:   Intra-op Plan:   Post-operative Plan: Extubation in OR  Informed Consent: I have reviewed the patients History and Physical, chart, labs and discussed the procedure including the risks, benefits and alternatives for the proposed anesthesia with the patient or authorized representative who has indicated his/her understanding and acceptance.   Dental advisory given  Plan Discussed with: CRNA, Anesthesiologist and Surgeon  Anesthesia Plan Comments:         Anesthesia Quick Evaluation

## 2016-02-19 NOTE — Op Note (Signed)
02/19/2016  11:06 AM  PATIENT:  Patricia Hart  55 y.o. female  PRE-OPERATIVE DIAGNOSIS:  Severe degenerative disc disease with loss of disc space height and foraminal stenosis causing back and left leg pain  POST-OPERATIVE DIAGNOSIS:  Same  PROCEDURE:  Anterior lumbar interbody fusion L5-S1 utilizing a peek interbody cage packed with morcellized allograft followed by anterior lumbar plating L5-S1 utilizing a nuvasive plate  SURGEON:  Marikay Alaravid Ubaldo Daywalt, MD  CO-SURGEON: Dr. Tawanna Coolerodd early  ANESTHESIA:   General  EBL: 250 ml  Total I/O In: 1500 [I.V.:1500] Out: 950 [Urine:700; Blood:250]  BLOOD ADMINISTERED:none  DRAINS: None   SPECIMEN:  No Specimen  INDICATION FOR PROCEDURE: This patient presented with a long history of back and left leg pain. MRI showed progressive spondylosis with loss of disc space height and foraminal stenosis L5-S1 compressing the left L5 nerve root. She tried medical management without relief. Her pain was debilitating. I recommended anterior lumbar interbody fusion L5-S1. Patient understood the risks, benefits, and alternatives and potential outcomes and wished to proceed.  PROCEDURE DETAILS: The patient was taken to the operating room and after induction of adequate generalized endotracheal anesthesia she was placed in the supine position on the operating room table. Her exposure was performed by Dr. Tawanna Coolerodd earlier vascular surgery in the exposure will be dictated as separate operative report. Once the exposure was complete we placed a needle into the disc space and checked our midline with AP fluoroscopy and then checked our level with lateral fluoroscopy. Once the level of L5-S1 was confirmed I incised the disc space and perform the initial discectomy with pituitary rongeurs. I released the disc from the endplates with a Cobb elevator. Discectomy was then continued with pituitary rongeur. I then distracted the disc space to a height of 12 mm. Anterior osteophytes off of  the anterior lip of L5 and S1. I then prepared the endplates with the high-speed drill. I drilled down to the level of the posterior longitudinal ligament. This was all done with lateral fluoroscopy. I then used sequential trials until the 12 mm x 12 trial fit the best. This was then packed with morcellized allograft and tapped into position and checked with lateral fluoroscopy. I then used a separate titanium plate and utilizing lateral fluoroscopy placed 25 mm screws into the L5 and S1 vertebral bodies and locked these into position. I then packed around the cage. I checked my final construct with AP and lateral fluoroscopy. I then irrigated with saline solution containing bacitracin. I removed the retractor and checked for any bleeding. We then checked a final AP x-ray to make sure there were no retained sponges or instruments. I then closed the fascia with running 0 Vicryl. Closed the subcutaneous tissues with 2-0 Vicryl. The skin was closed with 3-0 Vicryl and subcuticular tissue followed by Dermabond on the skin. The patient was then awakened and taken to the recovery room in stable condition. At the end of the procedure all sponge needle and x-ray counts were correct.  PLAN OF CARE: Admit to inpatient   PATIENT DISPOSITION:  PACU - hemodynamically stable.   Delay start of Pharmacological VTE agent (>24hrs) due to surgical blood loss or risk of bleeding:  yes

## 2016-02-19 NOTE — Op Note (Signed)
    OPERATIVE REPORT  DATE OF SURGERY: 02/19/2016  PATIENT: Patricia Hart, 55 y.o. female MRN: [id number]  DOB: 1961-03-22  PRE-OPERATIVE DIAGNOSIS: Degenerative Disc disease L5-S1  POST-OPERATIVE DIAGNOSIS:  Same  PROCEDURE: Anterior exposure for L5-S1 disc surgery  SURGEON:  Gretta Beganodd Tosha Belgarde, M.D.  Co-surgeon for the exposure Dr. Marikay Alaravid Jones  ANESTHESIA:  Gen.  EBL: 250 ml  Total I/O In: 2100 [I.V.:2100] Out: 1150 [Urine:900; Blood:250]  BLOOD ADMINISTERED: None  DRAINS: None  SPECIMEN: None  COUNTS CORRECT:  YES  PLAN OF CARE: PACU   PATIENT DISPOSITION:  PACU - hemodynamically stable  PROCEDURE DETAILS: Patient was taken to the operative placed above the area of the abdomen was prepped in usual sterile fashion. Crosstable lateral projection was undertaken revealing the level of the L5-S1 disc. Patient was prepped and draped in usual sterile fashion and the incision was made from the midline to the left at this level. This was carried down through the subcutaneous fat with electrocautery. The anterior rectus sheath was opened with electrocautery in line with the skin incision. The rectus muscle was mobilized circumferentially. The retro-peritoneal spacewas entered and the left lower quadrant and the peritoneal contents were mobilized to the right. The dissection extended down to the level of the psoas muscle and the dissection extended above the level of the psoas muscle. The ureters were reflected to the right as well. The iliac vessels were identified as was the L5-S1 disc. The middle sacral vessels were divided between ligaclips for my mobilization of the disc. The left and right iliac vessels were mobilized to the left and right of the disc. The Thompson retractor was brought onto the field and the reverse lip 150 blades were positioned to the right and left of the L5-S1 disc. The 140 malleable retractors were positioned for superior and inferior retraction. C-arm was brought  back onto the field and the needle was place in the L5-S1 disc. The disc level was confirmed. The remainder the procedure will be dictated as a separate note with Dr. Melina ModenaJones   Rohith Fauth F. Caniyah Murley, M.D., [facility] 02/19/2016 2:01 PM

## 2016-02-19 NOTE — Evaluation (Signed)
Physical Therapy Evaluation Patient Details Name: Patricia BonitoLaura Hart MRN: [id number] DOB: 08/24/1960 Today's Date: 02/19/2016   History of Present Illness  Patient is Patricia 55 y/o female with hx of PE, DVT, fibromyalgia and clotting disorder presents s/p L5-S1 ALIF.  Clinical Impression  Patient presents with pain, lethargy and post surgical deficits s/p above surgery. Tolerated gait training with Min Patricia for balance/safety secondary to dizziness. Pt recently given medication. Education re: back precautions, handout, positioning, etc. Pt plans to go home with mother or have son assist at d/c. Will plan for stair training tomorrow morning as tolerated. Will follow acutely to maximize independence and mobility prior to return home.     Follow Up Recommendations No PT follow up;Supervision - Intermittent    Equipment Recommendations  None recommended by PT    Recommendations for Other Services       Precautions / Restrictions Precautions Precautions: Back Precaution Booklet Issued: Yes (comment) Precaution Comments: Reviewed back precautions and handout. Restrictions Weight Bearing Restrictions: No      Mobility  Bed Mobility Overal bed mobility: Needs Assistance Bed Mobility: Rolling;Sidelying to Sit;Sit to Sidelying Rolling: Modified independent (Device/Increase time) Sidelying to sit: Modified independent (Device/Increase time)     Sit to sidelying: Modified independent (Device/Increase time) General bed mobility comments: HOB flat, no use of rails to simulate home. Cues for log roll technique.   Transfers Overall transfer level: Needs assistance Equipment used: None Transfers: Sit to/from Stand Sit to Stand: Min guard         General transfer comment: Min guard to steady in standing due to slight dizziness.   Ambulation/Gait Ambulation/Gait assistance: Min assist Ambulation Distance (Feet): 100 Feet Assistive device: 1 person hand held assist Gait Pattern/deviations:  Step-through pattern;Decreased stride length Gait velocity: decreased Gait velocity interpretation: Below normal speed for age/gender General Gait Details: Slow, mostly steady gait with some dizziness reported. handheld assist due to dizziness.   Stairs            Wheelchair Mobility    Modified Rankin (Stroke Patients Only)       Balance Overall balance assessment: Needs assistance Sitting-balance support: Feet supported;No upper extremity supported Sitting balance-Leahy Scale: Good     Standing balance support: During functional activity Standing balance-Leahy Scale: Fair                               Pertinent Vitals/Pain Pain Assessment: Faces Faces Pain Scale: Hurts little more Pain Location: abdomen Pain Descriptors / Indicators: Operative site guarding;Sore Pain Intervention(s): Monitored during session;Repositioned;Premedicated before session    Home Living Family/patient expects to be discharged to:: Private residence Living Arrangements: Children;Parent Available Help at Discharge: Family;Available 24 hours/day Type of Home: House Home Access: Stairs to enter Entrance Stairs-Rails: Right Entrance Stairs-Number of Steps: 4 Home Layout: Two level;Bed/bath upstairs Home Equipment: None      Prior Function Level of Independence: Independent         Comments: Plans on staying at her mother's house or her son will be able to help her at home. Home above is her house setup.     Hand Dominance        Extremity/Trunk Assessment   Upper Extremity Assessment: Defer to OT evaluation           Lower Extremity Assessment: Generalized weakness;LLE deficits/detail   LLE Deficits / Details: Burning left thigh  Cervical / Trunk Assessment: Other exceptions  Communication   Communication:  No difficulties  Cognition Arousal/Alertness: Lethargic;Suspect due to medications Behavior During Therapy: [facility] for tasks assessed/performed Overall  Cognitive Status: Within Functional Limits for tasks assessed                      General Comments General comments (skin integrity, edema, etc.): Mother present during session.    Exercises     Assessment/Plan    PT Assessment Patient needs continued PT services  PT Problem List Decreased strength;Decreased mobility;Decreased activity tolerance;Decreased balance;Pain          PT Treatment Interventions DME instruction;Therapeutic activities;Therapeutic exercise;Gait training;Patient/family education;Balance training;Stair training;Functional mobility training    PT Goals (Current goals can be found in the Care Plan section)  Acute Rehab PT Goals Patient Stated Goal: to go home tomorrow PT Goal Formulation: With patient Time For Goal Achievement: 03/04/16 Potential to Achieve Goals: Good    Frequency Min 5X/week   Barriers to discharge Inaccessible home environment stairs to get to bedroom    Co-evaluation               End of Session Equipment Utilized During Treatment: Gait belt Activity Tolerance: Patient tolerated treatment well Patient left: in bed;with call bell/phone within reach;with family/visitor present;with SCD's reapplied Nurse Communication: Mobility status         Time: 4098-1191 PT Time Calculation (min) (ACUTE ONLY): 15 min   Charges:   PT Evaluation $PT Eval Low Complexity: 1 Procedure     PT G Codes:        Patricia Hart Patricia Hart 02/19/2016, 3:18 PM Mylo Red, PT, DPT [phone]

## 2016-02-19 NOTE — Anesthesia Postprocedure Evaluation (Signed)
Anesthesia Post Note  Patient: Patricia Hart  Procedure(s) Performed: Procedure(s) (LRB): Anterior Lumbar Interbody Fusion  - Lumbar five-sacral one (N/A) ABDOMINAL EXPOSURE (N/A)  Patient location during evaluation: PACU Anesthesia Type: General Level of consciousness: awake and alert Pain management: pain level controlled Vital Signs Assessment: post-procedure vital signs reviewed and stable Respiratory status: spontaneous breathing, nonlabored ventilation, respiratory function stable and patient connected to nasal cannula oxygen Cardiovascular status: blood pressure returned to baseline and stable Postop Assessment: no signs of nausea or vomiting Anesthetic complications: no    Last Vitals:  Vitals:   02/19/16 0703 02/19/16 1111  BP: 126/80 123/80  Pulse: 73   Resp: 20 (!) 22  Temp: 36.6 C 36.7 C    Last Pain:  Vitals:   02/19/16 1111  TempSrc:   PainSc: Asleep                 Reino KentJudd, Magdalena Skilton J

## 2016-02-19 NOTE — H&P (Signed)
Subjective: Patient is a 55 y.o. female admitted for ALIF L5-S1. Onset of symptoms was several months ago, gradually worsening since that time.  The pain is rated severe, and is located at the across the lower back and radiates to legs. The pain is described as aching and occurs intermittently. The symptoms have been progressive. Symptoms are exacerbated by exercise. MRI or CT showed DDD L6-S1   Past Medical History:  Diagnosis Date  . Arthritis   . Blood transfusion without reported diagnosis 1997  . Clotting disorder (HCC) 1997   pregnancy induced dvt- no problems since then  . Fibromyalgia   . GERD (gastroesophageal reflux disease)   . History of DVT (deep vein thrombosis)    during pregnancy  . History of pulmonary embolism    occurred during pregnancy  . Overactive bladder     Past Surgical History:  Procedure Laterality Date  . ABLATION  2006   endometrial  . breast implants     saline  . CARPAL TUNNEL RELEASE Right 04/23/2015   Procedure: RIGHT CARPAL TUNNEL RELEASE;  Surgeon: Cindee SaltGary Kuzma, MD;  Location: [facility];  Service: Orthopedics;  Laterality: Right;  . CARPAL TUNNEL RELEASE Left 05/16/2015   Procedure: LEFT CARPAL TUNNEL RELEASE;  Surgeon: Cindee SaltGary Kuzma, MD;  Location: [facility];  Service: Orthopedics;  Laterality: Left;  . TONSILLECTOMY    . TRIGGER FINGER RELEASE Right 04/23/2015   Procedure: RELEASE TRIGGER FINGER/A-1 PULLEY RIGHT THUMB;  Surgeon: Cindee SaltGary Kuzma, MD;  Location: [facility];  Service: Orthopedics;  Laterality: Right;  . WISDOM TOOTH EXTRACTION      Prior to Admission medications   Medication Sig Start Date End Date Taking? Authorizing Provider  acetaminophen (TYLENOL) 500 MG tablet Take 1,500 mg by mouth every 6 (six) hours as needed for mild pain.   Yes Historical Provider, MD  aspirin-acetaminophen-caffeine (EXCEDRIN MIGRAINE) [phone] MG tablet Take 3 tablets by mouth every 6 (six) hours as needed for  headache or migraine.   Yes Historical Provider, MD  Cyanocobalamin (VITAMIN B-12 PO) Take 1 tablet by mouth daily.   Yes Historical Provider, MD  meloxicam (MOBIC) 15 MG tablet Take 15 mg by mouth every evening.  11/28/14  Yes Historical Provider, MD  Multiple Vitamins-Minerals (MULTIVITAMIN PO) Take 1 tablet by mouth daily.   Yes Historical Provider, MD  omeprazole (PRILOSEC) 20 MG capsule Take 20 mg by mouth daily as needed (acid reflux).    Yes Historical Provider, MD  pregabalin (LYRICA) 200 MG capsule Take 1 capsule (200 mg total) by mouth 2 (two) times daily. 11/01/15  Yes Myrlene BrokerElizabeth A Crawford, MD  tiZANidine (ZANAFLEX) 4 MG tablet Take 4 mg by mouth every 6 (six) hours as needed for muscle spasms.  03/21/15  Yes Historical Provider, MD  HYDROcodone-acetaminophen (NORCO) 5-325 MG tablet Take 1 tablet by mouth every 6 (six) hours as needed for moderate pain. Patient not taking: Reported on 02/12/2016 04/23/15   Cindee SaltGary Kuzma, MD  oxyCODONE-acetaminophen (PERCOCET/ROXICET) 5-325 MG tablet Take 2 tablets by mouth every 4 (four) hours as needed for severe pain. Patient not taking: Reported on 02/12/2016 09/05/15   Rolan BuccoMelanie Belfi, MD   Allergies  Allergen Reactions  . Latex Other (See Comments)    Skin irritation    Social History  Substance Use Topics  . Smoking status: Never Smoker  . Smokeless tobacco: Not on file  . Alcohol use 1.2 oz/week    2 Standard drinks or equivalent per week  Comment: 0-4 drinks/wk    Family History  Problem Relation Age of Onset  . Hyperlipidemia Mother   . Cancer Mother     colon; lung  . Osteoporosis Mother   . Diabetes Father   . Hyperlipidemia Father   . Hypertension Father   . Breast cancer Maternal Aunt     50's     Review of Systems  Positive ROS: neg  All other systems have been reviewed and were otherwise negative with the exception of those mentioned in the HPI and as above.  Objective: Vital signs in last 24 hours: Temp:  [97.9 F (36.6  C)] 97.9 F (36.6 C) (09/27 0703) Pulse Rate:  [73] 73 (09/27 0703) Resp:  [20] 20 (09/27 0703) BP: (126)/(80) 126/80 (09/27 0703) SpO2:  [96 %] 96 % (09/27 0703) Weight:  [65.8 kg (145 lb)] 65.8 kg (145 lb) (09/27 0703)  General Appearance: Alert, cooperative, no distress, appears stated age Head: Normocephalic, without obvious abnormality, atraumatic Eyes: PERRL, conjunctiva/corneas clear, EOM's intact    Neck: Supple, symmetrical, trachea midline Back: Symmetric, no curvature, ROM normal, no CVA tenderness Lungs:  respirations unlabored Heart: Regular rate and rhythm Abdomen: Soft, non-tender Extremities: Extremities normal, atraumatic, no cyanosis or edema Pulses: 2+ and symmetric all extremities Skin: Skin color, texture, turgor normal, no rashes or lesions  NEUROLOGIC:   Mental status: Alert and oriented x4,  no aphasia, good attention span, fund of knowledge, and memory Motor Exam - grossly normal Sensory Exam - grossly normal Reflexes: 1+ Coordination - grossly normal Gait - grossly normal Balance - grossly normal Cranial Nerves: I: smell Not tested  II: visual acuity  OS: nl    OD: nl  II: visual fields Full to confrontation  II: pupils Equal, round, reactive to light  III,VII: ptosis None  III,IV,VI: extraocular muscles  Full ROM  V: mastication Normal  V: facial light touch sensation  Normal  V,VII: corneal reflex  Present  VII: facial muscle function - upper  Normal  VII: facial muscle function - lower Normal  VIII: hearing Not tested  IX: soft palate elevation  Normal  IX,X: gag reflex Present  XI: trapezius strength  5/5  XI: sternocleidomastoid strength 5/5  XI: neck flexion strength  5/5  XII: tongue strength  Normal    Data Review Lab Results  Component Value Date   WBC 7.2 02/12/2016   HGB 12.5 02/12/2016   HCT 37.9 02/12/2016   MCV 92.7 02/12/2016   PLT 242 02/12/2016   Lab Results  Component Value Date   NA 138 02/12/2016   K 4.1  02/12/2016   CL 108 02/12/2016   CO2 21 (L) 02/12/2016   BUN 13 02/12/2016   CREATININE 0.80 02/12/2016   GLUCOSE 95 02/12/2016   No results found for: INR, PROTIME  Assessment/Plan: Patient admitted for ALIF. Patient has failed a reasonable attempt at conservative therapy.  I explained the condition and procedure to the patient and answered any questions.  Patient wishes to proceed with procedure as planned. Understands risks/ benefits and typical outcomes of procedure.   Donnajean Chesnut S 02/19/2016 7:44 AM

## 2016-02-20 MED ORDER — OXYCODONE-ACETAMINOPHEN 5-325 MG PO TABS: 1.0000 | ORAL_TABLET | Freq: Four times a day (QID) | ORAL | 0 refills | Status: DC | PRN

## 2016-02-20 MED ORDER — TIZANIDINE HCL 4 MG PO TABS: 4.0000 mg | ORAL_TABLET | Freq: Four times a day (QID) | ORAL | 0 refills | Status: DC | PRN

## 2016-02-20 NOTE — Progress Notes (Signed)
Physical Therapy Treatment Patient Details Name: Patricia Hart MRN: [id number] DOB: 1960/09/21 Today's Date: 02/20/2016    History of Present Illness Patient is a 55 y/o female with hx of PE, DVT, fibromyalgia and clotting disorder presents s/p L5-S1 ALIF.    PT Comments    Patient progressing well towards PT goals. More steady on feet today and no dizziness reported during mobility. Pt able to recall 3/3 back precautions. Tolerated stair training with Min guard for safety. Pt plans to d/c home today. Will follow if still in hospital tomorrow.   Follow Up Recommendations  No PT follow up;Supervision - Intermittent     Equipment Recommendations  None recommended by PT    Recommendations for Other Services       Precautions / Restrictions Precautions Precautions: Back Precaution Booklet Issued: Yes (comment) Precaution Comments: Able to recall 3/3 precautions. Restrictions Weight Bearing Restrictions: No    Mobility  Bed Mobility Overal bed mobility: Needs Assistance Bed Mobility: Rolling;Sidelying to Sit Rolling: Modified independent (Device/Increase time) Sidelying to sit: Modified independent (Device/Increase time);HOB elevated       General bed mobility comments: Increased time. HOB flat, no use of bed rails. Good technique.  Transfers Overall transfer level: Needs assistance Equipment used: None Transfers: Sit to/from Stand Sit to Stand: Supervision         General transfer comment: Supervision for safety. Stood from [employer], from toilet x1. transferred to chair post ambulation bout.  Ambulation/Gait Ambulation/Gait assistance: Supervision Ambulation Distance (Feet): 300 Feet Assistive device: None Gait Pattern/deviations: Step-through pattern;Decreased stride length Gait velocity: decreased Gait velocity interpretation: Below normal speed for age/gender General Gait Details: Slow, steady gait. No dizziness today.    Stairs Stairs: Yes Stairs  assistance: Min guard Stair Management: Alternating pattern;One rail Right Number of Stairs: 13 General stair comments: Cues for technique and safety.   Wheelchair Mobility    Modified Rankin (Stroke Patients Only)       Balance Overall balance assessment: Needs assistance Sitting-balance support: Feet supported;No upper extremity supported Sitting balance-Leahy Scale: Good     Standing balance support: During functional activity Standing balance-Leahy Scale: Good                      Cognition Arousal/Alertness: Awake/alert Behavior During Therapy: WFL for tasks assessed/performed Overall Cognitive Status: Within Functional Limits for tasks assessed                      Exercises      General Comments        Pertinent Vitals/Pain Pain Assessment: 0-10 Pain Score: 6  Pain Location: back Pain Descriptors / Indicators: Sore;Operative site guarding;Aching Pain Intervention(s): Monitored during session;Repositioned    Home Living                      Prior Function            PT Goals (current goals can now be found in the care plan section) Progress towards PT goals: Progressing toward goals    Frequency    Min 5X/week      PT Plan Current plan remains appropriate    Co-evaluation             End of Session Equipment Utilized During Treatment: Gait belt Activity Tolerance: Patient tolerated treatment well Patient left: in chair;with call bell/phone within reach     Time: 0750-0809 PT Time Calculation (min) (ACUTE ONLY): 19 min  Charges:  $  Gait Training: 8-22 mins                    G Codes:      Marcy PanningShauna A Prospero Mahnke 02/20/2016, 8:57 AM Mylo RedShauna Matalynn Graff, PT, DPT [phone]

## 2016-02-20 NOTE — Discharge Summary (Signed)
Physician Discharge Summary  Patient ID: Patricia BonitoLaura Swann MRN: [id number] DOB/AGE: [birth year] 54 y.o.  Admit date: 02/19/2016 Discharge date: 02/20/2016  Admission Diagnoses: DDD   Discharge Diagnoses: same   Discharged Condition: good  Hospital Course: The patient was admitted on 02/19/2016 and taken to the operating room where the patient underwent ALIF. The patient tolerated the procedure well and was taken to the recovery room and then to the floor in stable condition. The hospital course was routine. There were no complications. The wound remained clean dry and intact. Pt had appropriate back soreness. No complaints of leg pain or new N/T/W. The patient remained afebrile with stable vital signs, and tolerated a regular diet. The patient continued to increase activities, and pain was well controlled with oral pain medications.   Consults: None  Significant Diagnostic Studies:  Results for orders placed or performed during the hospital encounter of 02/12/16  Surgical pcr screen  Result Value Ref Range   MRSA, PCR NEGATIVE NEGATIVE   Staphylococcus aureus NEGATIVE NEGATIVE  Basic metabolic panel  Result Value Ref Range   Sodium 138 135 - 145 mmol/L   Potassium 4.1 3.5 - 5.1 mmol/L   Chloride 108 101 - 111 mmol/L   CO2 21 (L) 22 - 32 mmol/L   Glucose, Bld 95 65 - 99 mg/dL   BUN 13 6 - 20 mg/dL   Creatinine, Ser 4.090.80 0.44 - 1.00 mg/dL   Calcium 9.3 8.9 - 81.110.3 mg/dL   GFR calc non Af Amer >60 >60 mL/min   GFR calc Af Amer >60 >60 mL/min   Anion gap 9 5 - 15  CBC  Result Value Ref Range   WBC 7.2 4.0 - 10.5 K/uL   RBC 4.09 3.87 - 5.11 MIL/uL   Hemoglobin 12.5 12.0 - 15.0 g/dL   HCT 91.437.9 78.236.0 - 95.646.0 %   MCV 92.7 78.0 - 100.0 fL   MCH 30.6 26.0 - 34.0 pg   MCHC 33.0 30.0 - 36.0 g/dL   RDW 21.313.4 08.611.5 - 57.815.5 %   Platelets 242 150 - 400 K/uL  Type and screen All Cardiac and thoracic surgeries, spinal fusions, myomectomies, craniotomies, colon & liver resections, total joint  revisions, same day c-section with placenta previa or accreta.  Result Value Ref Range   ABO/RH(D) A NEG    Antibody Screen NEG    Sample Expiration 02/26/2016    Extend sample reason NO TRANSFUSIONS OR PREGNANCY IN THE PAST 3 MONTHS   ABO/Rh  Result Value Ref Range   ABO/RH(D) A NEG     Dg Lumbar Spine 2-3 Views  Result Date: 02/19/2016 CLINICAL DATA:  L5-S1 ALIF EXAM: LUMBAR SPINE - 2-3 VIEW COMPARISON:  None. FLUOROSCOPY TIME:  1 minutes 2 seconds FINDINGS: Two intraoperative fluoroscopic spot images are provided. Anterior lumbar interbody fusion at L5-S1. IMPRESSION: Anterior lumbar interbody fusion at L5-S1. Electronically Signed   By: Elige KoHetal  Patel   On: 02/19/2016 11:14   Dg C-arm 1-60 Min  Result Date: 02/19/2016 CLINICAL DATA:  Anterior fusion at L5-S1 EXAM: DG C-ARM 61-120 MIN COMPARISON:  Lumbar scratch MR lumbar spine of 09/05/2015 FINDINGS: Two C-arm spot films show anterior fusion at L5-S1. No complicating features are evident. IMPRESSION: C-arm fluoroscopy provided during anterior fusion at L5-S1. Fluoroscopy time of 1 minutes 2 seconds was recorded. Electronically Signed   By: Dwyane DeePaul  Barry M.D.   On: 02/19/2016 11:14   Dg Or Local Abdomen  Result Date: 02/19/2016 CLINICAL DATA:  Status post L5-S1 fusion with  concern for potential retained instrument EXAM: OR LOCAL ABDOMEN COMPARISON:  None. FINDINGS: There is postoperative change at L5 and S1. There is no retained needle or surgical instrument. The bowel gas pattern is normal. There is diffuse stool throughout the colon. IMPRESSION: Postoperative change at L5-S1. No retained instrument or needle. Bowel gas pattern unremarkable. Critical Value/emergent results were called by telephone at the time of interpretation on 02/19/2016 at 11:04 am to Darleene Cleaver, circulating nurse in OR, who verbally acknowledged these results. Electronically Signed   By: Bretta Bang III M.D.   On: 02/19/2016 11:05    Antibiotics:   Anti-infectives    Start     Dose/Rate Route Frequency Ordered Stop   02/19/16 1600  ceFAZolin (ANCEF) IVPB 1 g/50 mL premix     1 g 100 mL/hr over 30 Minutes Intravenous Every 8 hours 02/19/16 1132 02/19/16 2315   02/19/16 0952  bacitracin 50,000 Units in sodium chloride irrigation 0.9 % 500 mL irrigation  Status:  Discontinued       As needed 02/19/16 0952 02/19/16 1111   02/19/16 0809  vancomycin (VANCOCIN) 1000 MG powder  Status:  Discontinued    Comments:  Karmen Stabs   : cabinet override      02/19/16 0809 02/19/16 1133      Discharge Exam: Blood pressure 97/71, pulse 77, temperature 98.2 F (36.8 C), resp. rate 18, weight 65.8 kg (145 lb), last menstrual period 02/12/2003, SpO2 96 %. Neurologic: Grossly normal Incision ok  Discharge Medications:     Medication List    STOP taking these medications   HYDROcodone-acetaminophen 5-325 MG tablet Commonly known as:  NORCO   meloxicam 15 MG tablet Commonly known as:  MOBIC     TAKE these medications   acetaminophen 500 MG tablet Commonly known as:  TYLENOL Take 1,500 mg by mouth every 6 (six) hours as needed for mild pain.   aspirin-acetaminophen-caffeine 250-250-65 MG tablet Commonly known as:  EXCEDRIN MIGRAINE Take 3 tablets by mouth every 6 (six) hours as needed for headache or migraine.   MULTIVITAMIN PO Take 1 tablet by mouth daily.   omeprazole 20 MG capsule Commonly known as:  PRILOSEC Take 20 mg by mouth daily as needed (acid reflux).   oxyCODONE-acetaminophen 5-325 MG tablet Commonly known as:  PERCOCET/ROXICET Take 1-2 tablets by mouth every 6 (six) hours as needed for severe pain. What changed:  how much to take  when to take this   pregabalin 200 MG capsule Commonly known as:  LYRICA Take 1 capsule (200 mg total) by mouth 2 (two) times daily.   tiZANidine 4 MG tablet Commonly known as:  ZANAFLEX Take 1 tablet (4 mg total) by mouth every 6 (six) hours as needed for muscle spasms.    VITAMIN B-12 PO Take 1 tablet by mouth daily.       Disposition: home   Final Dx: ALIF L5-S1  Discharge Instructions    Call MD for:  difficulty breathing, headache or visual disturbances    Complete by:  As directed    Call MD for:  persistant nausea and vomiting    Complete by:  As directed    Call MD for:  redness, tenderness, or signs of infection (pain, swelling, redness, odor or green/yellow discharge around incision site)    Complete by:  As directed    Call MD for:  severe uncontrolled pain    Complete by:  As directed    Call MD for:  temperature >100.4  Complete by:  As directed    Diet - low sodium heart healthy    Complete by:  As directed    Discharge instructions    Complete by:  As directed    No heavy lifting or bending or twisting, no strenuous activity   Increase activity slowly    Complete by:  As directed          Signed: JONES,DAVID S 02/20/2016, 11:39 AM

## 2016-02-20 NOTE — Progress Notes (Signed)
Patient alert and oriented, mae's well, voiding adequate amount of urine, swallowing without difficulty, c/o mild pain. Patient discharged home with family. Script and discharged instructions given to patient. Patient and family stated understanding of instructions given.   

## 2016-02-20 NOTE — Progress Notes (Signed)
Patient ID: Patricia BonitoLaura Hart, female   DOB: [birth year], 55 y.o.   MRN: [id number] Randie HeinzGreat this morning. Comfortable. Reports she walked without difficulty. Reports no pain in her legs and minimal in her back and abdomen.  On physical exam her abdomen is soft with mild tenderness over the incision. 2-3+ dorsalis pedis pulses bilaterally  Stable postop day 1. Minimal discomfort. Has walked without difficulty. No nausea or vomiting. We'll not follow actively. Please call if we can assist.

## 2016-02-22 ENCOUNTER — Emergency Department (HOSPITAL_COMMUNITY)
Admission: EM | Admit: 2016-02-22 | Discharge: 2016-02-22 | Disposition: A | Discharge status: Home / Self Care | Payer: BLUE CROSS/BLUE SHIELD | Attending: Emergency Medicine | Admitting: Emergency Medicine

## 2016-02-22 ENCOUNTER — Emergency Department (HOSPITAL_COMMUNITY): Payer: BLUE CROSS/BLUE SHIELD

## 2016-02-22 ENCOUNTER — Encounter (HOSPITAL_COMMUNITY): Payer: Self-pay

## 2016-02-22 DIAGNOSIS — Z7982 Long term (current) use of aspirin: Secondary | ICD-10-CM | POA: Diagnosis not present

## 2016-02-22 DIAGNOSIS — Z91040 Latex allergy status: Secondary | ICD-10-CM | POA: Insufficient documentation

## 2016-02-22 DIAGNOSIS — R079 Chest pain, unspecified: Secondary | ICD-10-CM | POA: Diagnosis not present

## 2016-02-22 DIAGNOSIS — R072 Precordial pain: Secondary | ICD-10-CM | POA: Diagnosis not present

## 2016-02-22 LAB — CBC
HEMATOCRIT: 34.8 % — AB (ref 36.0–46.0)
HEMOGLOBIN: 11.5 g/dL — AB (ref 12.0–15.0)
MCH: 30.8 pg (ref 26.0–34.0)
MCHC: 33 g/dL (ref 30.0–36.0)
MCV: 93.3 fL (ref 78.0–100.0)
Platelets: 227 10*3/uL (ref 150–400)
RBC: 3.73 MIL/uL — AB (ref 3.87–5.11)
RDW: 13.4 % (ref 11.5–15.5)
WBC: 8.9 10*3/uL (ref 4.0–10.5)

## 2016-02-22 LAB — BASIC METABOLIC PANEL
ANION GAP: 10 (ref 5–15)
BUN: 9 mg/dL (ref 6–20)
CHLORIDE: 102 mmol/L (ref 101–111)
CO2: 23 mmol/L (ref 22–32)
Calcium: 9.1 mg/dL (ref 8.9–10.3)
Creatinine, Ser: 0.77 mg/dL (ref 0.44–1.00)
GFR calc non Af Amer: >60 mL/min (ref >60)
GLUCOSE: 94 mg/dL (ref 65–99)
POTASSIUM: 3.8 mmol/L (ref 3.5–5.1)
Sodium: 135 mmol/L (ref 135–145)

## 2016-02-22 LAB — I-STAT TROPONIN, ED
TROPONIN I, POC: 0 ng/mL (ref 0.00–0.08)
Troponin i, poc: 0.01 ng/mL (ref 0.00–0.08)

## 2016-02-22 MED ORDER — IOPAMIDOL (ISOVUE-370) INJECTION 76%
INTRAVENOUS | Status: AC
  Administered 2016-02-22: 100 mL
  Filled 2016-02-22: qty 100

## 2016-02-22 MED ORDER — MORPHINE SULFATE (PF) 4 MG/ML IV SOLN
4.0000 mg | Freq: Once | INTRAVENOUS | Status: AC
  Administered 2016-02-22: 4 mg via INTRAVENOUS
  Filled 2016-02-22: qty 1

## 2016-02-22 NOTE — ED Notes (Signed)
Pt requesting pain medication EDP to be notified. 

## 2016-02-22 NOTE — ED Notes (Signed)
Delay in lab draw,  Pt not in room 

## 2016-02-22 NOTE — ED Notes (Signed)
Urine collected.

## 2016-02-22 NOTE — ED Notes (Signed)
Patient transported to CT 

## 2016-02-22 NOTE — ED Notes (Signed)
Delay in lab draw,  Pt in CT. 

## 2016-02-22 NOTE — ED Provider Notes (Signed)
[facility]-EMERGENCY DEPT Provider Note   CSN: [id number] Arrival date & time: 02/22/16  1654     History   Chief Complaint Chief Complaint  Patient presents with  . Chest Pain    HPI Patricia BonitoLaura Hart is a 55 y.o. female with history of PE, DVT, status post spinal fusion on 02/19/16 who presents with left-sided chest pain that began this morning. Patient reports the pain is mild, but describes it as throbbing with radiation straight through to her back. She has also had a seizure sensation to her left arm. However, patient states that she has experienced similar paresthesia symptoms in her left arm before due to her carpal tunnel. She is not sure if this is the same. Patient denies shortness of breath, however does think that her pain is pleuritic. Patient denies any new abdominal pain, nausea, vomiting, urinary symptoms, new leg pain or swelling. Patient took a Percocet around 3 PM today with relief of her back pain, but not of the chest pain.  HPI  Past Medical History:  Diagnosis Date  . Arthritis   . Blood transfusion without reported diagnosis 1997  . Clotting disorder (HCC) 1997   pregnancy induced dvt- no problems since then  . Fibromyalgia   . GERD (gastroesophageal reflux disease)   . History of DVT (deep vein thrombosis)    during pregnancy  . History of pulmonary embolism    occurred during pregnancy  . Overactive bladder     Patient Active Problem List   Diagnosis Date Noted  . S/P lumbar spinal fusion 02/19/2016  . Yeast infection 11/02/2014  . Fibromyalgia 08/05/2014  . Routine general medical examination at a health care facility 08/05/2014    Past Surgical History:  Procedure Laterality Date  . ABLATION  2006   endometrial  . BACK SURGERY    . breast implants     saline  . CARPAL TUNNEL RELEASE Right 04/23/2015   Procedure: RIGHT CARPAL TUNNEL RELEASE;  Surgeon: Cindee SaltGary Kuzma, MD;  Location: [facility];  Service: Orthopedics;  Laterality: Right;    . CARPAL TUNNEL RELEASE Left 05/16/2015   Procedure: LEFT CARPAL TUNNEL RELEASE;  Surgeon: Cindee SaltGary Kuzma, MD;  Location: [facility];  Service: Orthopedics;  Laterality: Left;  . TONSILLECTOMY    . TRIGGER FINGER RELEASE Right 04/23/2015   Procedure: RELEASE TRIGGER FINGER/A-1 PULLEY RIGHT THUMB;  Surgeon: Cindee SaltGary Kuzma, MD;  Location: [facility];  Service: Orthopedics;  Laterality: Right;  . WISDOM TOOTH EXTRACTION      OB History    Gravida Para Term Preterm AB Living   1 1 1     1    SAB TAB Ectopic Multiple Live Births           1       Home Medications    Prior to Admission medications   Medication Sig Start Date End Date Taking? Authorizing Provider  acetaminophen (TYLENOL) 500 MG tablet Take 1,500 mg by mouth every 6 (six) hours as needed for mild pain.    Historical Provider, MD  aspirin-acetaminophen-caffeine (EXCEDRIN MIGRAINE) [id number] MG tablet Take 3 tablets by mouth every 6 (six) hours as needed for headache or migraine.    Historical Provider, MD  Cyanocobalamin (VITAMIN B-12 PO) Take 1 tablet by mouth daily.    Historical Provider, MD  Multiple Vitamins-Minerals (MULTIVITAMIN PO) Take 1 tablet by mouth daily.    Historical Provider, MD  omeprazole (PRILOSEC) 20 MG capsule Take 20 mg by mouth daily  as needed (acid reflux).     Historical Provider, MD  oxyCODONE-acetaminophen (PERCOCET/ROXICET) 5-325 MG tablet Take 1-2 tablets by mouth every 6 (six) hours as needed for severe pain. 02/20/16   Tia Alert, MD  pregabalin (LYRICA) 200 MG capsule Take 1 capsule (200 mg total) by mouth 2 (two) times daily. 11/01/15   Myrlene Broker, MD  tiZANidine (ZANAFLEX) 4 MG tablet Take 1 tablet (4 mg total) by mouth every 6 (six) hours as needed for muscle spasms. 02/20/16   Tia Alert, MD    Family History Family History  Problem Relation Age of Onset  . Hyperlipidemia Mother   . Cancer Mother     colon; lung  . Osteoporosis Mother   . Diabetes  Father   . Hyperlipidemia Father   . Hypertension Father   . Breast cancer Maternal Aunt     45's    Social History Social History  Substance Use Topics  . Smoking status: Never Smoker  . Smokeless tobacco: Never Used  . Alcohol use 1.2 oz/week    2 Standard drinks or equivalent per week     Comment: 0-4 drinks/wk     Allergies   Latex   Review of Systems Review of Systems  Constitutional: Negative for chills and fever.  HENT: Negative for facial swelling and sore throat.   Respiratory: Negative for shortness of breath.   Cardiovascular: Positive for chest pain. Negative for leg swelling.  Gastrointestinal: Negative for abdominal pain, nausea and vomiting.  Genitourinary: Negative for dysuria.  Musculoskeletal: Negative for back pain.  Skin: Negative for rash and wound.  Neurological: Negative for headaches.  Psychiatric/Behavioral: The patient is not nervous/anxious.      Physical Exam Updated Vital Signs BP 118/70   Pulse 74   Temp 98 F (36.7 C)   Resp 18   Ht 5\' 4"  (1.626 m)   Wt 68.2 kg   LMP 02/12/2003   SpO2 95%   BMI 25.82 kg/m   Physical Exam  Constitutional: She appears well-developed and well-nourished. No distress.  HENT:  Head: Normocephalic and atraumatic.  Mouth/Throat: Oropharynx is clear and moist. No oropharyngeal exudate.  Eyes: Conjunctivae are normal. Pupils are equal, round, and reactive to light. Right eye exhibits no discharge. Left eye exhibits no discharge. No scleral icterus.  Neck: Normal range of motion. Neck supple. No thyromegaly present.  Cardiovascular: Normal rate, regular rhythm, normal heart sounds and intact distal pulses.  Exam reveals no gallop and no friction rub.   No murmur heard. Left-sided chest pain improved with palpation  Pulmonary/Chest: Effort normal and breath sounds normal. No stridor. No respiratory distress. She has no wheezes. She has no rales.  Abdominal: Soft. Bowel sounds are normal. She exhibits no  distension. There is no tenderness. There is no rebound and no guarding.  Musculoskeletal: She exhibits no edema.  No calf tenderness to palpation bilaterally  Lymphadenopathy:    She has no cervical adenopathy.  Neurological: She is alert. Coordination normal.  Skin: Skin is warm and dry. No rash noted. She is not diaphoretic. No pallor.  Psychiatric: She has a normal mood and affect.  Nursing note and vitals reviewed.    ED Treatments / Results  Labs (all labs ordered are listed, but only abnormal results are displayed) Labs Reviewed  CBC - Abnormal; Notable for the following:       Result Value   RBC 3.73 (*)    Hemoglobin 11.5 (*)    HCT 34.8 (*)  All other components within normal limits  BASIC METABOLIC PANEL  I-STAT TROPOININ, ED  I-STAT TROPOININ, ED    EKG  EKG Interpretation  Date/Time:  Saturday February 22 2016 16:58:18 EDT Ventricular Rate:  74 PR Interval:  162 QRS Duration: 72 QT Interval:  356 QTC Calculation: 395 R Axis:   5 Text Interpretation:  Normal sinus rhythm Normal ECG No significant change since last tracing Confirmed by FLOYD MD, DANIEL [phone]) on 02/22/2016 5:59:40 PM       Radiology Dg Chest 2 View  Result Date: 02/22/2016 CLINICAL DATA:  Chest pain. EXAM: CHEST  2 VIEW COMPARISON:  None. FINDINGS: The heart size and mediastinal contours are within normal limits. No pneumothorax or pleural effusion is noted. Right lung is clear. Left basilar densities are noted concerning for subsegmental atelectasis or scarring. The visualized skeletal structures are unremarkable. IMPRESSION: Left basilar subsegmental atelectasis or scarring. Electronically Signed   By: Lupita Raider, M.D.   On: 02/22/2016 17:54   Ct Angio Chest Pe W And/or Wo Contrast  Result Date: 02/22/2016 CLINICAL DATA:  Left-sided throbbing pleuritic chest pain. Recent spinal fusion. Previous history of pulmonary embolus and deep venous thrombosis. EXAM: CT ANGIOGRAPHY CHEST  WITH CONTRAST TECHNIQUE: Multidetector CT imaging of the chest was performed using the standard protocol during bolus administration of intravenous contrast. Multiplanar CT image reconstructions and MIPs were obtained to evaluate the vascular anatomy. CONTRAST:  100 mL Isovue 370 COMPARISON:  None. FINDINGS: Cardiovascular: Satisfactory opacification of the pulmonary arteries to the segmental level. No evidence of pulmonary embolism. Normal heart size. No pericardial effusion. Mediastinum/Nodes: No enlarged mediastinal, hilar, or axillary lymph nodes. Thyroid gland, trachea, and esophagus demonstrate no significant findings. Lungs/Pleura: Areas of linear atelectasis in both lung bases. No focal consolidation. Calcified granulomas. No pleural effusions. No pneumothorax. Airways appear patent. Upper Abdomen: Bilateral breast implants. Low-attenuation lesion in segment 2 of the liver measuring about 14 mm diameter. Appearance is nonspecific but likely represents a small cyst. No other focal lesions identified. Musculoskeletal: Degenerative changes in the thoracic spine. No vertebral compression deformities. Prominent posterior osteophyte at the mid thoracic level, probably T6-7, causes impression upon the central canal. Review of the MIP images confirms the above findings. IMPRESSION: No evidence of significant pulmonary embolus. Linear atelectasis in the lung bases. 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 Electronically Signed   By: Burman Nieves M.D.   On: 02/22/2016 21:37    Procedures Procedures (including critical care time)  Medications Ordered in ED Medications  morphine 4 MG/ML injection 4 mg (4 mg Intravenous Given 02/22/16 2034)  iopamidol (ISOVUE-370) 76 % injection (100 mLs  Contrast Given 02/22/16 2106)     Initial Impression / Assessment and Plan / ED Course  I have reviewed the triage vital signs and the nursing notes.  Pertinent labs & imaging results that were available during my care of the patient  were reviewed by me and considered in my medical decision making (see chart for details).  Clinical Course    Due to patient's elevated risk for pulmonary embolism, will order CT angiogram.  CBC shows hemoglobin 11.5. BMP unremarkable. Delta Troponin 0.00. EKG shows NSR. CXR shows left basilar subsegmental atelectasis or scarring. CT angio shows no evidence of significant pulmonary embolus, linear atelectasis in the lung bases, probable hepatic cyst. Suspect musculoskeletal cause or pleuritis. Patient reports she has been having to pull herself up or often since her back surgery, which could explain a musculoskeletal cause. I suggested moist heat and stretches.  Patient advised to continue taking her Percocet and muscle relaxers at home as prescribed. Patient does not take NSAIDs at this time following her spinal fusion surgery. Follow-up to primary care provider if symptoms persist. Return precautions discussed. Patient understands and agrees with plan. Patient vitals stable throughout ED course and discharged in satisfactory condition. I discussed patient case with Dr. Adela Lank who agrees with plan.  Final Clinical Impressions(s) / ED Diagnoses   Final diagnoses:  Nonspecific chest pain    New Prescriptions Discharge Medication List as of 02/22/2016 10:16 PM       Emi Holes, PA-C 02/23/16 0035    Melene Plan, DO 02/23/16 1610

## 2016-02-22 NOTE — ED Triage Notes (Signed)
Pt had spinal fusion 02-19-16.  Onset this morning constant left chest pain, throbbinig.  No shortness of breath, diaphoresis, nausea.  Pain is worsening.  Pt took Oxycodone at 3pm, helped relieve back pain but has not relieved chest pain.

## 2016-02-22 NOTE — Discharge Instructions (Signed)
Treatment: Continue taking her medications at home as prescribed. Use moist heat on the area 3-4 times daily alternating 20 minutes on, 20 minutes off. Attempt the door stretch and other stretching as discussed.  Follow-up: Please follow-up with your primary care provider for further evaluation and treatment of your symptoms. Please return the emergency Department if you develop any new or worsening symptoms.

## 2016-02-24 ENCOUNTER — Encounter (HOSPITAL_COMMUNITY): Payer: Self-pay | Admitting: Neurological Surgery

## 2016-03-05 ENCOUNTER — Other Ambulatory Visit: Payer: Self-pay | Admitting: Internal Medicine

## 2016-03-20 DIAGNOSIS — L570 Actinic keratosis: Secondary | ICD-10-CM | POA: Diagnosis not present

## 2016-03-21 DIAGNOSIS — M545 Low back pain: Secondary | ICD-10-CM | POA: Diagnosis not present

## 2016-03-23 ENCOUNTER — Encounter: Payer: Self-pay | Admitting: Obstetrics and Gynecology

## 2016-03-23 ENCOUNTER — Ambulatory Visit (INDEPENDENT_AMBULATORY_CARE_PROVIDER_SITE_OTHER): Payer: BLUE CROSS/BLUE SHIELD | Admitting: Obstetrics and Gynecology

## 2016-03-23 VITALS — BP 120/66 | HR 70 | Resp 18 | Ht 62.5 in | Wt 153.0 lb

## 2016-03-23 DIAGNOSIS — Z01411 Encounter for gynecological examination (general) (routine) with abnormal findings: Secondary | ICD-10-CM | POA: Diagnosis not present

## 2016-03-23 DIAGNOSIS — Z0000 Encounter for general adult medical examination without abnormal findings: Secondary | ICD-10-CM | POA: Diagnosis not present

## 2016-03-23 DIAGNOSIS — A630 Anogenital (venereal) warts: Secondary | ICD-10-CM | POA: Diagnosis not present

## 2016-03-23 LAB — POCT URINALYSIS DIPSTICK
BILIRUBIN UA: NEGATIVE
Glucose, UA: NEGATIVE
KETONES UA: NEGATIVE
Leukocytes, UA: NEGATIVE
Nitrite, UA: NEGATIVE
PH UA: 5
Protein, UA: NEGATIVE
RBC UA: NEGATIVE
Urobilinogen, UA: NEGATIVE

## 2016-03-23 MED ORDER — IMIQUIMOD 5 % EX CREA: TOPICAL_CREAM | CUTANEOUS | 2 refills | Status: DC

## 2016-03-23 NOTE — Progress Notes (Signed)
55 y.o. [id number] Divorced Caucasian female here for annual exam.    Hx endometrial ablation.  No hot flashes. Some vaginal dryness issues.  Feels like she has gained weight.   Just did a chemical peel on her face for "skin cancer."  Hx DVT/PE.   Had back surgery one month ago.   Lumbar fusion.   Hx perianal condyloma.  Did not do Aldara Rx and would like to try this.   PCP:  Hillard DankerElizabeth Crawford, MD  Patient's last menstrual period was 05/25/2002 (approximate).           Sexually active: No.female  The current method of family planning is post menopausal status/Ablation.    Exercising: Yes.    walking Smoker:  no  Health Maintenance: Pap:  09-20-14 Ascus:Neg History of abnormal Pap:  no MMG:  13 years ago--Pt wants to schedule. Colonoscopy:  2016 normal with [facility];next due 2021--but patient has severe reaction to prep and will never have another colonoscopy!! Hep C:  Neg.  Screening Labs:  Hb today: PCP, Urine today: Neg   reports that she has never smoked. She has never used smokeless tobacco. She reports that she drinks about 3.6 oz of alcohol per week . She reports that she does not use drugs.  Past Medical History:  Diagnosis Date  . Arthritis   . Blood transfusion without reported diagnosis 1997  . Clotting disorder (HCC) 1997   pregnancy induced dvt- no problems since then  . Fibromyalgia   . GERD (gastroesophageal reflux disease)   . History of DVT (deep vein thrombosis)    during pregnancy  . History of pulmonary embolism    occurred during pregnancy  . Overactive bladder     Past Surgical History:  Procedure Laterality Date  . ABDOMINAL EXPOSURE N/A 02/19/2016   Procedure: ABDOMINAL EXPOSURE;  Surgeon: Larina Earthlyodd F Early, MD;  Location: [facility];  Service: Vascular;  Laterality: N/A;  . ABLATION  2006   endometrial  . ANTERIOR LUMBAR FUSION N/A 02/19/2016   Procedure: Anterior Lumbar Interbody Fusion  - Lumbar five-sacral one;  Surgeon: Tia Alertavid S Jones, MD;   Location: [facility];  Service: Neurosurgery;  Laterality: N/A;  . BACK SURGERY    . breast implants     saline  . CARPAL TUNNEL RELEASE Right 04/23/2015   Procedure: RIGHT CARPAL TUNNEL RELEASE;  Surgeon: Cindee SaltGary Kuzma, MD;  Location: [facility];  Service: Orthopedics;  Laterality: Right;  . CARPAL TUNNEL RELEASE Left 05/16/2015   Procedure: LEFT CARPAL TUNNEL RELEASE;  Surgeon: Cindee SaltGary Kuzma, MD;  Location: [facility];  Service: Orthopedics;  Laterality: Left;  . TONSILLECTOMY    . TRIGGER FINGER RELEASE Right 04/23/2015   Procedure: RELEASE TRIGGER FINGER/A-1 PULLEY RIGHT THUMB;  Surgeon: Cindee SaltGary Kuzma, MD;  Location: [facility];  Service: Orthopedics;  Laterality: Right;  . WISDOM TOOTH EXTRACTION      Current Outpatient Prescriptions  Medication Sig Dispense Refill  . acetaminophen (TYLENOL) 500 MG tablet Take 1,500 mg by mouth every 6 (six) hours as needed for mild pain.    Marland Kitchen. aspirin-acetaminophen-caffeine (EXCEDRIN MIGRAINE) 250-250-65 MG tablet Take 3 tablets by mouth every 6 (six) hours as needed for headache or migraine.    . Cyanocobalamin (VITAMIN B-12 PO) Take 1 tablet by mouth daily.    Marland Kitchen. LYRICA 200 MG capsule TAKE 1 CAPSULE BY MOUTH TWICE A DAY 60 capsule 3  . meloxicam (MOBIC) 15 MG tablet Take 1 tablet by mouth  as needed.  1  . Multiple Vitamins-Minerals (MULTIVITAMIN PO) Take 1 tablet by mouth daily.    Marland Kitchen omeprazole (PRILOSEC) 20 MG capsule Take 20 mg by mouth daily as needed (acid reflux).     Marland Kitchen oxyCODONE-acetaminophen (PERCOCET/ROXICET) 5-325 MG tablet Take 1-2 tablets by mouth every 6 (six) hours as needed for severe pain. 60 tablet 0  . tiZANidine (ZANAFLEX) 4 MG tablet Take 1 tablet (4 mg total) by mouth every 6 (six) hours as needed for muscle spasms. 30 tablet 0   No current facility-administered medications for this visit.     Family History  Problem Relation Age of Onset  . Hyperlipidemia Mother   . Cancer Mother      colon; lung  . Osteoporosis Mother   . Diabetes Father   . Hyperlipidemia Father   . Hypertension Father   . Breast cancer Maternal Aunt     50's    ROS:  Pertinent items are noted in HPI.  Otherwise, a comprehensive ROS was negative.  Exam:   BP 120/66 (BP Location: Right Arm, Patient Position: Sitting, Cuff Size: Normal)   Pulse 70   Resp 18   Ht 5' 2.5" (1.588 m)   Wt 153 lb (69.4 kg)   LMP 05/25/2002 (Approximate)   BMI 27.54 kg/m     General appearance: alert, cooperative and appears stated age Head: Normocephalic, without obvious abnormality, atraumatic Neck: no adenopathy, supple, symmetrical, trachea midline and thyroid normal to inspection and palpation Lungs: clear to auscultation bilaterally Breasts: implants, normal appearance, no masses or tenderness, No nipple retraction or dimpling, No nipple discharge or bleeding, No axillary or supraclavicular adenopathy Heart: regular rate and rhythm Abdomen: soft, non-tender; no masses, no organomegaly Extremities: extremities normal, atraumatic, no cyanosis or edema Skin: Skin color, texture, turgor normal. No rashes or lesions Lymph nodes: Cervical, supraclavicular, and axillary nodes normal. No abnormal inguinal nodes palpated Neurologic: Grossly normal  Pelvic: External genitalia:  no lesions              Urethra:  normal appearing urethra with no masses, tenderness or lesions              Bartholins and Skenes: normal                 Vagina: normal appearing vagina with normal color and discharge, no lesions              Cervix: no lesions              Pap taken: No. Bimanual Exam:  Uterus:  normal size, contour, position, consistency, mobility, non-tender              Adnexa: no mass, fullness, tenderness              Rectal exam: Yes.  .  Confirms.              Anus:  normal sphincter tone,  Multiple small condyloma.   Chaperone was present for exam.  Assessment:   Well woman visit. Status post endometrial  ablation.  Breast implants. Hx DVT/PE. Peri anal condyloma.  Plan: Yearly mammogram recommended after age 72.   Patient will schedule this. She is given their phone number and states she will take responsibility to call.  Recommended self breast awareness. Pap and HR HPV as above. Discussed Calcium, Vitamin D, regular exercise program including cardiovascular and weight bearing exercise. Aldara cream.  Instructed in use.  Follow up in 6 weeks.  Prefers to do 6 weeks instead of 3 months.  Follow up annually and prn.       After visit summary provided.

## 2016-03-23 NOTE — Patient Instructions (Signed)
EXERCISE AND DIET:  We recommended that you start or continue a regular exercise program for good health. Regular exercise means any activity that makes your heart beat faster and makes you sweat.  We recommend exercising at least 30 minutes per day at least 3 days a week, preferably 4 or 5.  We also recommend a diet low in fat and sugar.  Inactivity, poor dietary choices and obesity can cause diabetes, heart attack, stroke, and kidney damage, among others.    ALCOHOL AND SMOKING:  Women should limit their alcohol intake to no more than 7 drinks/beers/glasses of wine (combined, not each!) per week. Moderation of alcohol intake to this level decreases your risk of breast cancer and liver damage. And of course, no recreational drugs are part of a healthy lifestyle.  And absolutely no smoking or even second hand smoke. Most people know smoking can cause heart and lung diseases, but did you know it also contributes to weakening of your bones? Aging of your skin?  Yellowing of your teeth and nails?  CALCIUM AND VITAMIN D:  Adequate intake of calcium and Vitamin D are recommended.  The recommendations for exact amounts of these supplements seem to change often, but generally speaking 600 mg of calcium (either carbonate or citrate) and 800 units of Vitamin D per day seems prudent. Certain women may benefit from higher intake of Vitamin D.  If you are among these women, your doctor will have told you during your visit.    PAP SMEARS:  Pap smears, to check for cervical cancer or precancers,  have traditionally been done yearly, although recent scientific advances have shown that most women can have pap smears less often.  However, every woman still should have a physical exam from her gynecologist every year. It will include a breast check, inspection of the vulva and vagina to check for abnormal growths or skin changes, a visual exam of the cervix, and then an exam to evaluate the size and shape of the uterus and  ovaries.  And after 54 years of age, a rectal exam is indicated to check for rectal cancers. We will also provide age appropriate advice regarding health maintenance, like when you should have certain vaccines, screening for sexually transmitted diseases, bone density testing, colonoscopy, mammograms, etc.   MAMMOGRAMS:  All women over 40 years old should have a yearly mammogram. Many facilities now offer a "3D" mammogram, which may cost around $50 extra out of pocket. If possible,  we recommend you accept the option to have the 3D mammogram performed.  It both reduces the number of women who will be called back for extra views which then turn out to be normal, and it is better than the routine mammogram at detecting truly abnormal areas.    COLONOSCOPY:  Colonoscopy to screen for colon cancer is recommended for all women at age 50.  We know, you hate the idea of the prep.  We agree, BUT, having colon cancer and not knowing it is worse!!  Colon cancer so often starts as a polyp that can be seen and removed at colonscopy, which can quite literally save your life!  And if your first colonoscopy is normal and you have no family history of colon cancer, most women don't have to have it again for 10 years.  Once every ten years, you can do something that may end up saving your life, right?  We will be happy to help you get it scheduled when you are ready.    Be sure to check your insurance coverage so you understand how much it will cost.  It may be covered as a preventative service at no cost, but you should check your particular policy.    'Imiquimod skin cream What is this medicine? IMIQUIMOD (i mi KWI mod) cream is used to treat external genital or anal warts. It is also used to treat other skin conditions such as actinic keratosis and certain types of skin cancer. This medicine may be used for other purposes; ask your health care provider or pharmacist if you have questions. What should I tell my health care  provider before I take this medicine? They need to know if you have any of these conditions: -decreased immune function -an unusual or allergic reaction to imiquimod, other medicines, foods, dyes, or preservatives -pregnant or trying to get pregnant -breast-feeding How should I use this medicine? This medicine is for external use only. Do not take by mouth. Follow the directions on the prescription label. Apply just before bedtime. Wash your hands before and after use. Apply a thin layer of cream and massage gently into the affected areas until no longer visible. Do not use in the mouth, eyes or the vagina. Use this medicine only on the affected area as directed by your health care provider. Do not use for longer than prescribed. It is important not to use more medicine than prescribed. To do so may increase the chance of side effects. Talk to your pediatrician regarding the use of this medicine in children. While this drug may be prescribed for children as young as 54 years of age for selected conditions, precautions do apply. Overdosage: If you think you have taken too much of this medicine contact a poison control center or emergency room at once. NOTE: This medicine is only for you. Do not share this medicine with others. What if I miss a dose? If you miss a dose, use it as soon as you can. If it is almost time for your next dose, use only that dose. Do not use double or extra doses. What may interact with this medicine? Interactions are not expected. Do not use any other medicines on the treated area without asking your doctor or health care professional. This list may not describe all possible interactions. Give your health care provider a list of all the medicines, herbs, non-prescription drugs, or dietary supplements you use. Also tell them if you smoke, drink alcohol, or use illegal drugs. Some items may interact with your medicine. What should I watch for while using this medicine? Visit  your health care professional for regular checks on your progress. Do not use this medicine until the skin has healed from any other drug (example: podofilox or podophyllin resin) or surgical skin treatment. Females should receive regular pelvic exams while being treated for genital warts. Most patients see improvement within 4 weeks. It may take up to 16 weeks to see a full clearing of the warts. This medicine is not a cure. New warts may develop during or after treatment. Avoid sexual (genital, anal, oral) contact while the cream is on the skin. If warts are visible in the genital area, sexual contact should be avoided until the warts are treated. The use of latex condoms during sexual contact may reduce, but not entirely prevent, infecting others. This medicine may weaken condoms, diaphragms, cervical caps or other barrier devices and make them less effective as birth control. Do not cover the treated area with an airtight bandage.  Cotton gauze dressings can be used. Cotton underwear can be worn after using this medicine on the genital or anal area. Actinic keratoses that were not seen before may appear during treatment and may later go away. The treatment area and surrounding area may lighten or darken after treatment with this medicine. These skin color changes may be permanent in some patients. If you experience a skin reaction at the treatment site that interferes or prevents you from doing any daily activity, contact your health care provider. You may need a rest period from treatment. Treatment may be restarted once the reaction has gotten better as recommended by your doctor or health care professional. This medicine can make you more sensitive to the sun. Keep out of the sun. If you cannot avoid being in the sun, wear protective clothing and use sunscreen. Do not use sun lamps or tanning beds/booths. What side effects may I notice from receiving this medicine? Side effects that you should report to  your doctor or health care professional as soon as possible: -open sores with or without drainage -skin infection -skin rash -unusual or severe skin reaction Side effects that usually do not require medical attention (report to your doctor or health care professional if they continue or are bothersome): -burning or itching -redness of the skin (very common but is usually not painful or harmful) -scabbing, crusting, or peeling skin -skin that becomes hard or thickened -swelling of the skin This list may not describe all possible side effects. Call your doctor for medical advice about side effects. You may report side effects to FDA at 1-800-FDA-1088. Where should I keep my medicine? Keep out of the reach of children. Store between 4 and 25 degrees C (39 and 77 degrees F). Do not freeze. Throw away any unused medicine after the expiration date. Discard packet after applying to affected area. Partial packets should not be saved or reused. NOTE: This sheet is a summary. It may not cover all possible information. If you have questions about this medicine, talk to your doctor, pharmacist, or health care provider.    2016, Elsevier/Gold Standard. (2008-04-24 10:33:25)

## 2016-03-24 ENCOUNTER — Encounter: Payer: Self-pay | Admitting: Obstetrics and Gynecology

## 2016-03-26 ENCOUNTER — Ambulatory Visit (INDEPENDENT_AMBULATORY_CARE_PROVIDER_SITE_OTHER): Payer: BLUE CROSS/BLUE SHIELD | Admitting: Internal Medicine

## 2016-03-26 ENCOUNTER — Encounter: Payer: Self-pay | Admitting: Internal Medicine

## 2016-03-26 ENCOUNTER — Other Ambulatory Visit (INDEPENDENT_AMBULATORY_CARE_PROVIDER_SITE_OTHER): Payer: BLUE CROSS/BLUE SHIELD

## 2016-03-26 VITALS — BP 118/78 | HR 74 | Temp 98.6°F | Resp 16 | Ht 63.0 in | Wt 153.0 lb

## 2016-03-26 DIAGNOSIS — R5383 Other fatigue: Secondary | ICD-10-CM

## 2016-03-26 DIAGNOSIS — Z23 Encounter for immunization: Secondary | ICD-10-CM | POA: Diagnosis not present

## 2016-03-26 DIAGNOSIS — M797 Fibromyalgia: Secondary | ICD-10-CM | POA: Diagnosis not present

## 2016-03-26 LAB — VITAMIN B12

## 2016-03-26 LAB — T4, FREE: FREE T4: 0.68 ng/dL (ref 0.60–1.60)

## 2016-03-26 LAB — VITAMIN D 25 HYDROXY (VIT D DEFICIENCY, FRACTURES): VITD: 25.83 ng/mL — ABNORMAL LOW (ref 30.00–100.00)

## 2016-03-26 LAB — TSH: TSH: 3.42 u[IU]/mL (ref 0.35–4.50)

## 2016-03-26 NOTE — Progress Notes (Signed)
   Subjective:    Patient ID: Patricia Hart, female    DOB: [birth year], 55 y.o.   MRN: [id number]  HPI The patient is a 55 YO female coming in for follow up of her fibromyalgia. She is still taking the lyrica 200 mg BID which is helpful for some of the pain. She has recently had back surgery and is recovering well from that. This has eased her pain some. No new symptoms such as weight change, fevers or chills. She is thinking she should be back on synthroid as she took it a long time ago.   Review of Systems  Constitutional: Positive for activity change and fatigue. Negative for appetite change, fever and unexpected weight change.  Respiratory: Negative.   Cardiovascular: Negative.   Gastrointestinal: Negative.   Musculoskeletal: Positive for arthralgias, back pain and myalgias. Negative for gait problem, neck pain and neck stiffness.  Neurological: Positive for weakness. Negative for dizziness, light-headedness, numbness and headaches.  Psychiatric/Behavioral: Negative.  Negative for agitation.      Objective:   Physical Exam  Constitutional: She is oriented to person, place, and time. She appears well-developed and well-nourished.  HENT:  Head: Normocephalic and atraumatic.  Eyes: EOM are normal.  Neck: Normal range of motion.  Cardiovascular: Normal rate and regular rhythm.   Pulmonary/Chest: Effort normal and breath sounds normal.  Abdominal: Soft. She exhibits no distension. There is no tenderness. There is no rebound.  Neurological: She is alert and oriented to person, place, and time. Coordination normal.  Skin: Skin is warm and dry.   Vitals:   03/26/16 1403  BP: 118/78  Pulse: 74  Resp: 16  Temp: 98.6 F (37 C)  TempSrc: Oral  SpO2: 97%  Weight: 153 lb (69.4 kg)  Height: 5\' 3"  (1.6 m)      Assessment & Plan:  Flu shot given at visit.

## 2016-03-26 NOTE — Progress Notes (Signed)
Pre visit review using our clinic review tool, if applicable. No additional management support is needed unless otherwise documented below in the visit note. 

## 2016-03-26 NOTE — Patient Instructions (Signed)
We are checking the labs today and will send you the results.   You can try taking vitamin B12 for energy 1000 mcg daily.

## 2016-03-26 NOTE — Assessment & Plan Note (Signed)
Checking labs for fatigue today. Reminded her that we do not prescribe thyroid medicine if her levels are normal. She then asked for a medical equivalent to speed if there was one. Advised her that there was not and that it would not be safe to give her that.

## 2016-03-31 DIAGNOSIS — M5137 Other intervertebral disc degeneration, lumbosacral region: Secondary | ICD-10-CM | POA: Diagnosis not present

## 2016-04-01 ENCOUNTER — Telehealth: Payer: Self-pay | Admitting: Obstetrics and Gynecology

## 2016-04-01 NOTE — Telephone Encounter (Signed)
It is hard to know by phone if symptoms are from yeast, bacterial vaginosis or atrophy.  I recommend office visit with me.  If she wishes to do OTC with Monistat 1 or 3, that is Ok . She can then return if the symptoms don't resolve.

## 2016-04-01 NOTE — Telephone Encounter (Signed)
Patient wanting a prescription for yeast infection called to pharmacy. Told her she would need an appointment but patient said she was in recently and would prefer checking with nurse. [facility] [address] at [phone].

## 2016-04-01 NOTE — Telephone Encounter (Signed)
Spoke with patient. Patient states that she began having internal and external vaginal itching yesterday. Itching has increased today. Denies any vaginal discharge, pelvic or abdominal discomfort. Patient is requesting a prescription for yeast infection. Advised patient she will need to be seen in the office for further evaluation to ensure proper treatment. Patient declines appointment at this time stating she was just seen by Dr.Silva on 03/23/2016 for aex. Advised I will speak with Dr.Silva and return call with further recommendations. Patient is agreeable. Pharmacy on file is correct.

## 2016-04-02 DIAGNOSIS — D225 Melanocytic nevi of trunk: Secondary | ICD-10-CM | POA: Diagnosis not present

## 2016-04-02 DIAGNOSIS — Z86018 Personal history of other benign neoplasm: Secondary | ICD-10-CM | POA: Diagnosis not present

## 2016-04-02 DIAGNOSIS — L821 Other seborrheic keratosis: Secondary | ICD-10-CM | POA: Diagnosis not present

## 2016-04-02 DIAGNOSIS — L814 Other melanin hyperpigmentation: Secondary | ICD-10-CM | POA: Diagnosis not present

## 2016-04-02 DIAGNOSIS — L570 Actinic keratosis: Secondary | ICD-10-CM | POA: Diagnosis not present

## 2016-04-02 NOTE — Telephone Encounter (Signed)
Left message to call Kaitlyn at [phone]. 

## 2016-04-08 NOTE — Telephone Encounter (Signed)
Left message to call Shomari Scicchitano at [phone]. 

## 2016-04-13 NOTE — Telephone Encounter (Signed)
Will close encounter

## 2016-04-13 NOTE — Telephone Encounter (Signed)
Dr.Jertson, attempted to reach this patient x 2 without return call. Okay to close encounter?

## 2016-04-21 DIAGNOSIS — M545 Low back pain: Secondary | ICD-10-CM | POA: Diagnosis not present

## 2016-04-22 DIAGNOSIS — M75112 Incomplete rotator cuff tear or rupture of left shoulder, not specified as traumatic: Secondary | ICD-10-CM | POA: Diagnosis not present

## 2016-04-22 DIAGNOSIS — M1712 Unilateral primary osteoarthritis, left knee: Secondary | ICD-10-CM | POA: Diagnosis not present

## 2016-04-29 DIAGNOSIS — M25512 Pain in left shoulder: Secondary | ICD-10-CM | POA: Diagnosis not present

## 2016-05-01 DIAGNOSIS — M25551 Pain in right hip: Secondary | ICD-10-CM | POA: Diagnosis not present

## 2016-05-01 DIAGNOSIS — M545 Low back pain: Secondary | ICD-10-CM | POA: Diagnosis not present

## 2016-05-01 DIAGNOSIS — M25512 Pain in left shoulder: Secondary | ICD-10-CM | POA: Diagnosis not present

## 2016-05-04 ENCOUNTER — Ambulatory Visit: Payer: BLUE CROSS/BLUE SHIELD | Admitting: Obstetrics and Gynecology

## 2016-05-04 ENCOUNTER — Encounter: Payer: Self-pay | Admitting: Obstetrics and Gynecology

## 2016-05-04 NOTE — Progress Notes (Deleted)
GYNECOLOGY  VISIT   HPI: 55 y.o.   Divorced  Caucasian  female   G1P1001 with No LMP recorded. Patient has had an ablation.here for 6 week follow up.   GYNECOLOGIC HISTORY: No LMP recorded. Patient has had an ablation. Contraception: Postmenopausal/Ablation Menopausal hormone therapy:  *** Last mammogram:  2003  Last pap smear:   09-20-14 Ascus:Neg        OB History    Gravida Para Term Preterm AB Living   1 1 1     1    SAB TAB Ectopic Multiple Live Births           1         Patient Active Problem List   Diagnosis Date Noted  . S/P lumbar spinal fusion 02/19/2016  . Yeast infection 11/02/2014  . Fibromyalgia 08/05/2014  . Routine general medical examination at a health care facility 08/05/2014    Past Medical History:  Diagnosis Date  . Arthritis   . Blood transfusion without reported diagnosis 1997  . Clotting disorder (HCC) 1997   pregnancy induced dvt- no problems since then  . Fibromyalgia   . GERD (gastroesophageal reflux disease)   . History of DVT (deep vein thrombosis)    during pregnancy  . History of pulmonary embolism    occurred during pregnancy  . Overactive bladder     Past Surgical History:  Procedure Laterality Date  . ABDOMINAL EXPOSURE N/A 02/19/2016   Procedure: ABDOMINAL EXPOSURE;  Surgeon: Larina Earthlyodd F Early, MD;  Location: [facility];  Service: Vascular;  Laterality: N/A;  . ABLATION  2006   endometrial  . ANTERIOR LUMBAR FUSION N/A 02/19/2016   Procedure: Anterior Lumbar Interbody Fusion  - Lumbar five-sacral one;  Surgeon: Tia Alertavid S Jones, MD;  Location: [facility];  Service: Neurosurgery;  Laterality: N/A;  . BACK SURGERY    . breast implants     saline  . CARPAL TUNNEL RELEASE Right 04/23/2015   Procedure: RIGHT CARPAL TUNNEL RELEASE;  Surgeon: Cindee SaltGary Kuzma, MD;  Location: [facility];  Service: Orthopedics;  Laterality: Right;  . CARPAL TUNNEL RELEASE Left 05/16/2015   Procedure: LEFT CARPAL TUNNEL RELEASE;  Surgeon: Cindee SaltGary  Kuzma, MD;  Location: [facility];  Service: Orthopedics;  Laterality: Left;  . TONSILLECTOMY    . TRIGGER FINGER RELEASE Right 04/23/2015   Procedure: RELEASE TRIGGER FINGER/A-1 PULLEY RIGHT THUMB;  Surgeon: Cindee SaltGary Kuzma, MD;  Location: [facility];  Service: Orthopedics;  Laterality: Right;  . WISDOM TOOTH EXTRACTION      Current Outpatient Prescriptions  Medication Sig Dispense Refill  . acetaminophen (TYLENOL) 500 MG tablet Take 1,500 mg by mouth every 6 (six) hours as needed for mild pain.    . Cyanocobalamin (VITAMIN B-12 PO) Take 1 tablet by mouth daily.    Marland Kitchen. HYDROcodone-acetaminophen (NORCO/VICODIN) 5-325 MG tablet Take 2 tablets by mouth every 6 (six) hours as needed for moderate pain.    Marland Kitchen. imiquimod (ALDARA) 5 % cream Apply entire packet of cream to affected areas 3 times weekly. Place at bedtime and wash off 8 hours later. 12 each 2  . LYRICA 200 MG capsule TAKE 1 CAPSULE BY MOUTH TWICE A DAY 60 capsule 3  . meloxicam (MOBIC) 15 MG tablet Take 1 tablet by mouth as needed.  1  . Multiple Vitamins-Minerals (MULTIVITAMIN PO) Take 1 tablet by mouth daily.    Marland Kitchen. omeprazole (PRILOSEC) 20 MG capsule Take 20 mg by mouth daily as needed (  acid reflux).     Marland Kitchen. tiZANidine (ZANAFLEX) 4 MG tablet Take 1 tablet (4 mg total) by mouth every 6 (six) hours as needed for muscle spasms. 30 tablet 0   No current facility-administered medications for this visit.      ALLERGIES: Latex  Family History  Problem Relation Age of Onset  . Hyperlipidemia Mother   . Cancer Mother     colon; lung  . Osteoporosis Mother   . Diabetes Father   . Hyperlipidemia Father   . Hypertension Father   . Breast cancer Maternal Aunt     6950's    Social History   Social History  . Marital status: Divorced    Spouse name: N/A  . Number of children: N/A  . Years of education: N/A   Occupational History  . Not on file.   Social History Main Topics  . Smoking status: Never Smoker  .  Smokeless tobacco: Never Used  . Alcohol use 3.6 oz/week    2 Standard drinks or equivalent, 4 Glasses of wine per week     Comment: 0-4 drinks/wk  . Drug use: No  . Sexual activity: Not Currently    Partners: Male    Birth control/ protection: Surgical     Comment: Ablation   Other Topics Concern  . Not on file   Social History Narrative  . No narrative on file    ROS:  Pertinent items are noted in HPI.  PHYSICAL EXAMINATION:    There were no vitals taken for this visit.    General appearance: alert, cooperative and appears stated age Head: Normocephalic, without obvious abnormality, atraumatic Neck: no adenopathy, supple, symmetrical, trachea midline and thyroid normal to inspection and palpation Lungs: clear to auscultation bilaterally Breasts: normal appearance, no masses or tenderness, No nipple retraction or dimpling, No nipple discharge or bleeding, No axillary or supraclavicular adenopathy Heart: regular rate and rhythm Abdomen: soft, non-tender, no masses,  no organomegaly Extremities: extremities normal, atraumatic, no cyanosis or edema Skin: Skin color, texture, turgor normal. No rashes or lesions Lymph nodes: Cervical, supraclavicular, and axillary nodes normal. No abnormal inguinal nodes palpated Neurologic: Grossly normal  Pelvic: External genitalia:  no lesions              Urethra:  normal appearing urethra with no masses, tenderness or lesions              Bartholins and Skenes: normal                 Vagina: normal appearing vagina with normal color and discharge, no lesions              Cervix: no lesions                Bimanual Exam:  Uterus:  normal size, contour, position, consistency, mobility, non-tender              Adnexa: no mass, fullness, tenderness              Rectal exam: {yes no:[id number]}.  Confirms.              Anus:  normal sphincter tone, no lesions  Chaperone was present for exam.  ASSESSMENT     PLAN     An After Visit  Summary was printed and given to the patient.  ______ minutes face to face time of which over 50% was spent in counseling.

## 2016-05-21 DIAGNOSIS — M545 Low back pain: Secondary | ICD-10-CM | POA: Diagnosis not present

## 2016-06-12 ENCOUNTER — Telehealth: Payer: Self-pay | Admitting: Internal Medicine

## 2016-06-12 NOTE — Telephone Encounter (Signed)
Massage from triage line stating Pt called stated Lyrica 200 mg need to start PA on.

## 2016-06-15 NOTE — Telephone Encounter (Signed)
Pt called in and is upset because she has been without this med for a week.  She wants to know what the status of the PA is

## 2016-06-15 NOTE — Telephone Encounter (Signed)
Contacted patient to get pharmacy to send form over again

## 2016-06-19 ENCOUNTER — Telehealth: Payer: Self-pay

## 2016-06-19 NOTE — Telephone Encounter (Signed)
Called and asked lauren/pharm to please send PA form via fax to ---she has already sent to side a fax machine, we did not receive---she is now resending to fax on side b----I will call patient when fax recd

## 2016-06-19 NOTE — Telephone Encounter (Signed)
[facility] and they have been sending it to the wrong fax number, gave [facility] the correct fax number and she repeated it back to assure that it was correct.  Now awaiting for fax to come through

## 2016-06-19 NOTE — Telephone Encounter (Signed)
Called and talked with patient and advised that I have requested faxed form be sent to all 3 of our fax machines and I still do not have a faxed copy from them---we are submitting request into cover my meds to see if we can get PA approved there

## 2016-06-19 NOTE — Telephone Encounter (Signed)
Spoke with pt, she said she call them 4 times and got them to fax over the forms 4 times. Can we please help with this, [facility] on [address] and get the form [phone] (she has been waiting for 2 wks for this).

## 2016-06-21 DIAGNOSIS — M545 Low back pain: Secondary | ICD-10-CM | POA: Diagnosis not present

## 2016-06-22 NOTE — Telephone Encounter (Signed)
Patient is calling back to follow [place].  Patient was upset about the process.  I did transfer patient to Julie's VM.  Patient did not want to talk to me in regard to what was going on.  Please follow up in regard.  Thanks!

## 2016-06-24 ENCOUNTER — Telehealth: Payer: Self-pay

## 2016-06-24 NOTE — Telephone Encounter (Signed)
Patient called in asking about the PA for her Lyrica 200mg , stated to patient that it has been denied, patient was very unhappy about this. Stated that she called her insurance and they told her that we didn't send anything to them, yet in cover my meds her insurance has denied the request.  I stated to patient that when I get the form through fax that explains why it was denied that I can try to appeal it to get it covered but until then there wasn't much more that I could do.  Patient wasn't happy with that and said "ok" and hung up the phone.

## 2016-06-26 NOTE — Telephone Encounter (Signed)
Pt would like you you to give her a call about the appeal process.  She wants to know if it has been submitted

## 2016-07-01 ENCOUNTER — Telehealth: Payer: Self-pay

## 2016-07-01 NOTE — Telephone Encounter (Signed)
Refer to telephone note from 07/01/2016

## 2016-07-01 NOTE — Telephone Encounter (Signed)
I contacted insurance company and asked why it was denied, insurance is sending over form again but stated that she has not tried alternative medications (gabapentin, Duloxetine) and that is the reason the have denied the Pa.  Contacted patient and stated what insurance told me, she said that she tried both medications before coming to this office so we don't have record of it, I stated that if she was able to get records of when she started and ended alternative meds that I will try to appeal but until then there wasn't anything I was able to do.

## 2016-07-07 ENCOUNTER — Emergency Department (HOSPITAL_COMMUNITY)
Admission: EM | Admit: 2016-07-07 | Discharge: 2016-07-08 | Disposition: A | Discharge status: Home / Self Care | Payer: BLUE CROSS/BLUE SHIELD | Attending: Emergency Medicine | Admitting: Emergency Medicine

## 2016-07-07 ENCOUNTER — Encounter (HOSPITAL_COMMUNITY): Payer: Self-pay

## 2016-07-07 DIAGNOSIS — R51 Headache: Secondary | ICD-10-CM | POA: Diagnosis not present

## 2016-07-07 DIAGNOSIS — Z91040 Latex allergy status: Secondary | ICD-10-CM | POA: Insufficient documentation

## 2016-07-07 DIAGNOSIS — R112 Nausea with vomiting, unspecified: Secondary | ICD-10-CM | POA: Diagnosis not present

## 2016-07-07 DIAGNOSIS — R519 Headache, unspecified: Secondary | ICD-10-CM

## 2016-07-07 LAB — COMPREHENSIVE METABOLIC PANEL
ALK PHOS: 55 U/L (ref 38–126)
ALT: 37 U/L (ref 14–54)
ANION GAP: 10 (ref 5–15)
AST: 32 U/L (ref 15–41)
Albumin: 4.2 g/dL (ref 3.5–5.0)
BUN: 12 mg/dL (ref 6–20)
CALCIUM: 9.7 mg/dL (ref 8.9–10.3)
CO2: 24 mmol/L (ref 22–32)
CREATININE: 0.71 mg/dL (ref 0.44–1.00)
Chloride: 103 mmol/L (ref 101–111)
GFR calc Af Amer: >60 mL/min (ref >60)
GFR calc non Af Amer: >60 mL/min (ref >60)
Glucose, Bld: 135 mg/dL — ABNORMAL HIGH (ref 65–99)
Potassium: 4 mmol/L (ref 3.5–5.1)
SODIUM: 137 mmol/L (ref 135–145)
TOTAL PROTEIN: 6.9 g/dL (ref 6.5–8.1)
Total Bilirubin: 0.8 mg/dL (ref 0.3–1.2)

## 2016-07-07 LAB — URINALYSIS, ROUTINE W REFLEX MICROSCOPIC
BILIRUBIN URINE: NEGATIVE
Glucose, UA: NEGATIVE mg/dL
Hgb urine dipstick: NEGATIVE
Ketones, ur: NEGATIVE mg/dL
Leukocytes, UA: NEGATIVE
NITRITE: NEGATIVE
PROTEIN: NEGATIVE mg/dL
SPECIFIC GRAVITY, URINE: 1.016 (ref 1.005–1.030)
pH: 8 (ref 5.0–8.0)

## 2016-07-07 LAB — CBC
HCT: 40.3 % (ref 36.0–46.0)
HEMOGLOBIN: 14 g/dL (ref 12.0–15.0)
MCH: 31 pg (ref 26.0–34.0)
MCHC: 34.7 g/dL (ref 30.0–36.0)
MCV: 89.2 fL (ref 78.0–100.0)
PLATELETS: 266 10*3/uL (ref 150–400)
RBC: 4.52 MIL/uL (ref 3.87–5.11)
RDW: 14 % (ref 11.5–15.5)
WBC: 9 10*3/uL (ref 4.0–10.5)

## 2016-07-07 LAB — LIPASE, BLOOD: Lipase: 13 U/L (ref 11–51)

## 2016-07-07 MED ORDER — ONDANSETRON 4 MG PO TBDP
4.0000 mg | ORAL_TABLET | Freq: Once | ORAL | Status: AC | PRN
  Administered 2016-07-07: 4 mg via ORAL

## 2016-07-07 MED ORDER — METOCLOPRAMIDE HCL 5 MG/ML IJ SOLN
10.0000 mg | Freq: Once | INTRAMUSCULAR | Status: AC
  Administered 2016-07-08: 10 mg via INTRAVENOUS
  Filled 2016-07-07: qty 2

## 2016-07-07 MED ORDER — ONDANSETRON 4 MG PO TBDP
ORAL_TABLET | ORAL | Status: AC
  Filled 2016-07-07: qty 1

## 2016-07-07 MED ORDER — DIPHENHYDRAMINE HCL 50 MG/ML IJ SOLN
25.0000 mg | Freq: Once | INTRAMUSCULAR | Status: AC
  Administered 2016-07-08: 25 mg via INTRAVENOUS
  Filled 2016-07-07: qty 1

## 2016-07-07 MED ORDER — SODIUM CHLORIDE 0.9 % IV BOLUS (SEPSIS)
1000.0000 mL | Freq: Once | INTRAVENOUS | Status: AC
  Administered 2016-07-08: 1000 mL via INTRAVENOUS

## 2016-07-07 NOTE — ED Provider Notes (Signed)
[facility]-EMERGENCY DEPT Provider Note   CSN: [id number] Arrival date & time: 07/07/16  1929  By signing my name below, I, Cynda Acres, attest that this documentation has been prepared under the direction and in the presence of Dione Booze, MD. Electronically Signed: Cynda Acres, Scribe. 07/07/16. 11:30 PM.  History   Chief Complaint Chief Complaint  Patient presents with  . Emesis  . Headache    HPI Comments: Patricia Hart is a 56 y.o. female with a hx of GERD, DVT, and PE, who presents to the Emergency Department complaining of sudden-onset, constant emesis that began earlier this morning. Patient states she has been vomiting non-stop since this morning. Patient states she has not been able to drink a sip of water all day. Patient has associated nausea, chills, frontal headache (10/10), sore throat, abdominal pain (6/10), and neck pain (7/10). Patient reports going to [facility] for her sons basic training graduation. Patient reports being stuck in an airport all day, where she could have contacted sickness. Nothing makes her headache worse. Patient was given nausea medication with minimal relief. Patient denies any fever, diarrhea, or headache changes with light exposure.   HPI  Past Medical History:  Diagnosis Date  . Arthritis   . Blood transfusion without reported diagnosis 1997  . Clotting disorder (HCC) 1997   pregnancy induced dvt- no problems since then  . Fibromyalgia   . GERD (gastroesophageal reflux disease)   . History of DVT (deep vein thrombosis)    during pregnancy  . History of pulmonary embolism    occurred during pregnancy  . Overactive bladder     Patient Active Problem List   Diagnosis Date Noted  . S/P lumbar spinal fusion 02/19/2016  . Yeast infection 11/02/2014  . Fibromyalgia 08/05/2014  . Routine general medical examination at a health care facility 08/05/2014    Past Surgical History:  Procedure Laterality Date  . ABDOMINAL EXPOSURE N/A 02/19/2016    Procedure: ABDOMINAL EXPOSURE;  Surgeon: Larina Earthly, MD;  Location: [facility];  Service: Vascular;  Laterality: N/A;  . ABLATION  2006   endometrial  . ANTERIOR LUMBAR FUSION N/A 02/19/2016   Procedure: Anterior Lumbar Interbody Fusion  - Lumbar five-sacral one;  Surgeon: Tia Alert, MD;  Location: [facility];  Service: Neurosurgery;  Laterality: N/A;  . BACK SURGERY    . breast implants     saline  . CARPAL TUNNEL RELEASE Right 04/23/2015   Procedure: RIGHT CARPAL TUNNEL RELEASE;  Surgeon: Cindee Salt, MD;  Location: [facility];  Service: Orthopedics;  Laterality: Right;  . CARPAL TUNNEL RELEASE Left 05/16/2015   Procedure: LEFT CARPAL TUNNEL RELEASE;  Surgeon: Cindee Salt, MD;  Location: [facility];  Service: Orthopedics;  Laterality: Left;  . TONSILLECTOMY    . TRIGGER FINGER RELEASE Right 04/23/2015   Procedure: RELEASE TRIGGER FINGER/A-1 PULLEY RIGHT THUMB;  Surgeon: Cindee Salt, MD;  Location: [facility];  Service: Orthopedics;  Laterality: Right;  . WISDOM TOOTH EXTRACTION      OB History    Gravida Para Term Preterm AB Living   1 1 1     1    SAB TAB Ectopic Multiple Live Births           1       Home Medications    Prior to Admission medications   Medication Sig Start Date End Date Taking? Authorizing Provider  acetaminophen (TYLENOL) 500 MG tablet Take 1,500 mg by mouth every  6 (six) hours as needed for mild pain.    Historical Provider, MD  Cyanocobalamin (VITAMIN B-12 PO) Take 1 tablet by mouth daily.    Historical Provider, MD  HYDROcodone-acetaminophen (NORCO/VICODIN) 5-325 MG tablet Take 2 tablets by mouth every 6 (six) hours as needed for moderate pain.    Historical Provider, MD  imiquimod Mathis Dad(ALDARA) 5 % cream Apply entire packet of cream to affected areas 3 times weekly. Place at bedtime and wash off 8 hours later. 03/23/16   Brook Rosalin HawkingE Amundson C Silva, MD  LYRICA 200 MG capsule TAKE 1 CAPSULE BY MOUTH TWICE A DAY  03/05/16   Myrlene BrokerElizabeth A Crawford, MD  meloxicam (MOBIC) 15 MG tablet Take 1 tablet by mouth as needed. 03/11/16   Historical Provider, MD  Multiple Vitamins-Minerals (MULTIVITAMIN PO) Take 1 tablet by mouth daily.    Historical Provider, MD  omeprazole (PRILOSEC) 20 MG capsule Take 20 mg by mouth daily as needed (acid reflux).     Historical Provider, MD  tiZANidine (ZANAFLEX) 4 MG tablet Take 1 tablet (4 mg total) by mouth every 6 (six) hours as needed for muscle spasms. 02/20/16   Tia Alertavid S Jones, MD    Family History Family History  Problem Relation Age of Onset  . Hyperlipidemia Mother   . Cancer Mother     colon; lung  . Osteoporosis Mother   . Diabetes Father   . Hyperlipidemia Father   . Hypertension Father   . Breast cancer Maternal Aunt     1950's    Social History Social History  Substance Use Topics  . Smoking status: Never Smoker  . Smokeless tobacco: Never Used  . Alcohol use 3.6 oz/week    2 Standard drinks or equivalent, 4 Glasses of wine per week     Comment: 0-4 drinks/wk     Allergies   Latex   Review of Systems Review of Systems  Constitutional: Positive for chills. Negative for fever.  HENT: Positive for sore throat.   Gastrointestinal: Positive for abdominal pain, nausea and vomiting. Negative for diarrhea.  Neurological: Positive for headaches (Frontal area).  All other systems reviewed and are negative.    Physical Exam Updated Vital Signs BP 131/82   Pulse 76   Temp 98.5 F (36.9 C) (Oral)   Resp 18   Ht 5\' 3"  (1.6 m)   Wt 142 lb (64.4 kg)   SpO2 96%   BMI 25.15 kg/m   Physical Exam  Constitutional: She is oriented to person, place, and time. She appears well-developed and well-nourished.  HENT:  Head: Normocephalic and atraumatic.  Eyes: EOM are normal. Pupils are equal, round, and reactive to light.  Fundi are normal.   Neck: Normal range of motion. Neck supple. No JVD present.  Mild tenderness over left paracervical muscles.     Cardiovascular: Normal rate, regular rhythm and normal heart sounds.   No murmur heard. Pulmonary/Chest: Effort normal and breath sounds normal. She has no wheezes. She has no rales. She exhibits no tenderness.  Abdominal: Soft. She exhibits no distension and no mass. There is no tenderness.  Bowel sounds decreased.   Musculoskeletal: Normal range of motion. She exhibits no edema.  Lymphadenopathy:    She has no cervical adenopathy.  Neurological: She is alert and oriented to person, place, and time. No cranial nerve deficit. She exhibits normal muscle tone. Coordination normal.  Skin: Skin is warm and dry. No rash noted.  Psychiatric: She has a normal mood and affect. Her behavior  is normal. Judgment and thought content normal.  Nursing note and vitals reviewed.    ED Treatments / Results  DIAGNOSTIC STUDIES: Oxygen Saturation is 96% on RA, adequate by my interpretation.    COORDINATION OF CARE: 11:29 PM Discussed treatment plan with pt at bedside and pt agreed to plan, which includes IV fluids.   Labs (all labs ordered are listed, but only abnormal results are displayed) Labs Reviewed  COMPREHENSIVE METABOLIC PANEL - Abnormal; Notable for the following:       Result Value   Glucose, Bld 135 (*)    All other components within normal limits  LIPASE, BLOOD  CBC  URINALYSIS, ROUTINE W REFLEX MICROSCOPIC   Procedures Procedures (including critical care time)  Medications Ordered in ED Medications  ondansetron (ZOFRAN-ODT) disintegrating tablet 4 mg (4 mg Oral Given 07/07/16 2036)     Initial Impression / Assessment and Plan / ED Course  I have reviewed the triage vital signs and the nursing notes.  Pertinent lab results that were available during my care of the patient were reviewed by me and considered in my medical decision making (see chart for details).  Nausea and vomiting which seems most consistent with viral gastritis. No red flags to suggest more serious  pathology. Headache of uncertain cause. Possible muscle contraction headache. She had slight relief of nausea with ondansetron given in the triage area. She is given IV fluids, metoclopramide, diphenhydramine with good relief of headache and better relief of nausea. Old records are reviewed, and she has no relevant past visits. She is discharged with prescription for metoclopramide. Follow-up with PCP as needed.  Final Clinical Impressions(s) / ED Diagnoses   Final diagnoses:  Non-intractable vomiting with nausea, unspecified vomiting type  Headache, unspecified headache type    New Prescriptions New Prescriptions   METOCLOPRAMIDE (REGLAN) 10 MG TABLET    Take 1 tablet (10 mg total) by mouth every 6 (six) hours as needed for nausea (or headache).   I personally performed the services described in this documentation, which was scribed in my presence. The recorded information has been reviewed and is accurate.       Dione Booze, MD 07/08/16 [id number]

## 2016-07-07 NOTE — ED Triage Notes (Signed)
Pt states she had sudden onset of n/v; pt states she is unable to keep anything down; pt states she now has HA and neck pain at 7/10 on arrival. Pt a&ox 4 on arrival. Pt ambulated to triage room; pt denies other sx

## 2016-07-08 MED ORDER — METOCLOPRAMIDE HCL 10 MG PO TABS: 10.0000 mg | ORAL_TABLET | Freq: Four times a day (QID) | ORAL | 0 refills | Status: DC | PRN

## 2016-07-09 ENCOUNTER — Telehealth: Payer: Self-pay | Admitting: Internal Medicine

## 2016-07-09 NOTE — Telephone Encounter (Signed)
Patient Name: Patricia BonitoLAURA Hart DOB: [birth year] Initial Comment [name] states she is vomiting and not able to eat or drink anything. Nurse Assessment Nurse: Vear ClockPhillips, RN, Marchelle FolksAmanda Date/Time (Eastern Time): 07/09/2016 1:18:58 PM Confirm and document reason for call. If symptomatic, describe symptoms. ---[name] states she is vomiting and not able to eat or drink anything for three days. [name] states she was seen in the ED Tuesday night. [name] states she feels like the food get stuck in her throat and then she vomits. Does the patient have any new or worsening symptoms? ---Yes Will a triage be completed? ---Yes Related visit to physician within the last 2 weeks? ---Yes Does the PT have any chronic conditions? (i.e. diabetes, asthma, etc.) ---No Is the patient pregnant or possibly pregnant? (Ask all females between the ages of 5512-55) ---No Is this a behavioral health or substance abuse call? ---No Guidelines Guideline Title Affirmed Question Affirmed Notes Vomiting [1] SEVERE vomiting (e.g., 6 or more times/day) AND [2] present > 8 hours Final Disposition User Go to ED Now (or PCP triage) Vear ClockPhillips, RN, Marchelle FolksAmanda Comments RN Grier RocherMiranda Jones checked for an appointment but there is nothing available. This nurse explained this to caller and encouraged caller to call 911 if she is too weak to get in the car to go to ED. Also strongly encouraged patient to call back if she decides not to go to the ED. Referrals REFERRED TO PCP OFFICE [facility] - ED Disagree/Comply: Comply

## 2016-07-15 DIAGNOSIS — R232 Flushing: Secondary | ICD-10-CM | POA: Diagnosis not present

## 2016-07-22 DIAGNOSIS — M545 Low back pain: Secondary | ICD-10-CM | POA: Diagnosis not present

## 2016-07-27 DIAGNOSIS — M5137 Other intervertebral disc degeneration, lumbosacral region: Secondary | ICD-10-CM | POA: Diagnosis not present

## 2016-08-11 ENCOUNTER — Telehealth: Payer: Self-pay | Admitting: Internal Medicine

## 2016-08-11 NOTE — Telephone Encounter (Signed)
PA not approved last time because she hadn't tried anything else, patient said she did try other medications at another office but we have no record of it, patient was supposed to get back to us with those records so we could do PA back in January and never got back with us

## 2016-08-11 NOTE — Telephone Encounter (Signed)
Pharmacy is following up on PA for Lyrica.

## 2016-08-19 DIAGNOSIS — M545 Low back pain: Secondary | ICD-10-CM | POA: Diagnosis not present

## 2016-09-02 ENCOUNTER — Telehealth: Payer: Self-pay

## 2016-09-02 NOTE — Telephone Encounter (Signed)
Started PA via cover my meds  KEY: [id number]

## 2016-09-19 DIAGNOSIS — M545 Low back pain: Secondary | ICD-10-CM | POA: Diagnosis not present

## 2016-10-01 NOTE — Telephone Encounter (Signed)
PA previously denied.

## 2016-10-19 DIAGNOSIS — M545 Low back pain: Secondary | ICD-10-CM | POA: Diagnosis not present

## 2016-10-23 ENCOUNTER — Telehealth: Payer: Self-pay | Admitting: *Deleted

## 2016-10-23 MED ORDER — PREGABALIN 200 MG PO CAPS: 200.0000 mg | ORAL_CAPSULE | Freq: Two times a day (BID) | ORAL | 3 refills | Status: DC

## 2016-10-23 NOTE — Telephone Encounter (Signed)
Pt left msg on triage requesting a refill for Lyrica 200mg .

## 2016-10-26 ENCOUNTER — Encounter: Payer: Self-pay | Admitting: Internal Medicine

## 2016-10-26 ENCOUNTER — Ambulatory Visit (INDEPENDENT_AMBULATORY_CARE_PROVIDER_SITE_OTHER): Payer: BLUE CROSS/BLUE SHIELD | Admitting: Internal Medicine

## 2016-10-26 VITALS — BP 142/90 | HR 85 | Temp 98.5°F | Resp 12 | Ht 63.0 in | Wt 155.0 lb

## 2016-10-26 DIAGNOSIS — R5383 Other fatigue: Secondary | ICD-10-CM | POA: Diagnosis not present

## 2016-10-26 DIAGNOSIS — M797 Fibromyalgia: Secondary | ICD-10-CM | POA: Diagnosis not present

## 2016-10-26 MED ORDER — HYDROCODONE-ACETAMINOPHEN 5-325 MG PO TABS: 2.0000 | ORAL_TABLET | Freq: Every day | ORAL | 0 refills | Status: DC | PRN

## 2016-10-26 NOTE — Progress Notes (Signed)
   Subjective:    Patient ID: Patricia Hart, female    DOB: April 28, 1961, 56 y.o.   MRN: [id number]  HPI The patient is a 56 YO female coming in for follow up of her fibromyalgia. She feels that she is still recovering from back surgery but is having a lot of diffuse back pain. She is not taking her lyrica due to coverage and did not realize we had refilled last week so is still not taking. She is in pain a lot and having a twisted muscle in her back which started when she was doing some gardening. She is trying to keep up normal activities because her global pain is worse when she cannot do that.   Review of Systems  Constitutional: Positive for activity change. Negative for appetite change, fatigue, fever and unexpected weight change.  Respiratory: Negative.   Cardiovascular: Negative.   Gastrointestinal: Negative.   Musculoskeletal: Positive for arthralgias, back pain and myalgias. Negative for gait problem, joint swelling, neck pain and neck stiffness.  Skin: Negative.   Neurological: Negative.   Psychiatric/Behavioral: Negative.       Objective:   Physical Exam  Constitutional: She is oriented to person, place, and time. She appears well-developed and well-nourished.  HENT:  Head: Normocephalic and atraumatic.  Eyes: EOM are normal.  Neck: Normal range of motion.  Cardiovascular: Normal rate and regular rhythm.   Pulmonary/Chest: Effort normal. No respiratory distress. She has no wheezes. She has no rales.  Abdominal: Soft.  Musculoskeletal: She exhibits tenderness.  Tenderness along the entire spine as well as lumbar paraspinal left sided  Neurological: She is alert and oriented to person, place, and time. Coordination normal.  Skin: Skin is warm and dry.   Vitals:   10/26/16 1525  BP: (!) 142/90  Pulse: 85  Resp: 12  Temp: 98.5 F (36.9 C)  TempSrc: Oral  SpO2: 99%  Weight: 155 lb (70.3 kg)  Height: 5\' 3"  (1.6 m)      Assessment & Plan:

## 2016-10-26 NOTE — Patient Instructions (Signed)
We have given you some of the vicodin, the laws have changed which affects the way we can prescribe this medicine.   We have sent in the lyrica so you can fill this.   We are checking the labs and need you to come get them drawn before 9AM so they are accurate.   I would strongly recommend getting back in with the back specialist for long term management of the back pain.

## 2016-10-26 NOTE — Telephone Encounter (Signed)
Faxed

## 2016-10-28 NOTE — Assessment & Plan Note (Signed)
Advised that lyrica was refilled. Counseled that vicodin is not long term management of fibromyalgia and she is asked to return to her back specialist for alternative treatment of her spine. Small rx for vicodin #5 no refills given for back strain as she is not able to take muscle relaxers and NSAIDs have not been effective at home. Advised her that we cannot prescribe this long term.

## 2016-11-19 DIAGNOSIS — M545 Low back pain: Secondary | ICD-10-CM | POA: Diagnosis not present

## 2016-12-19 DIAGNOSIS — M545 Low back pain: Secondary | ICD-10-CM | POA: Diagnosis not present

## 2017-01-12 ENCOUNTER — Encounter: Payer: Self-pay | Admitting: Obstetrics and Gynecology

## 2017-01-19 DIAGNOSIS — M545 Low back pain: Secondary | ICD-10-CM | POA: Diagnosis not present

## 2017-02-07 IMAGING — CR DG OR LOCAL ABDOMEN
1 series · 1 of 1 positions shown · non-contrast
Comparison: None.

CLINICAL DATA: Status post L5-S1 fusion with concern for potential
retained instrument

EXAM:
OR LOCAL ABDOMEN

[no exam]
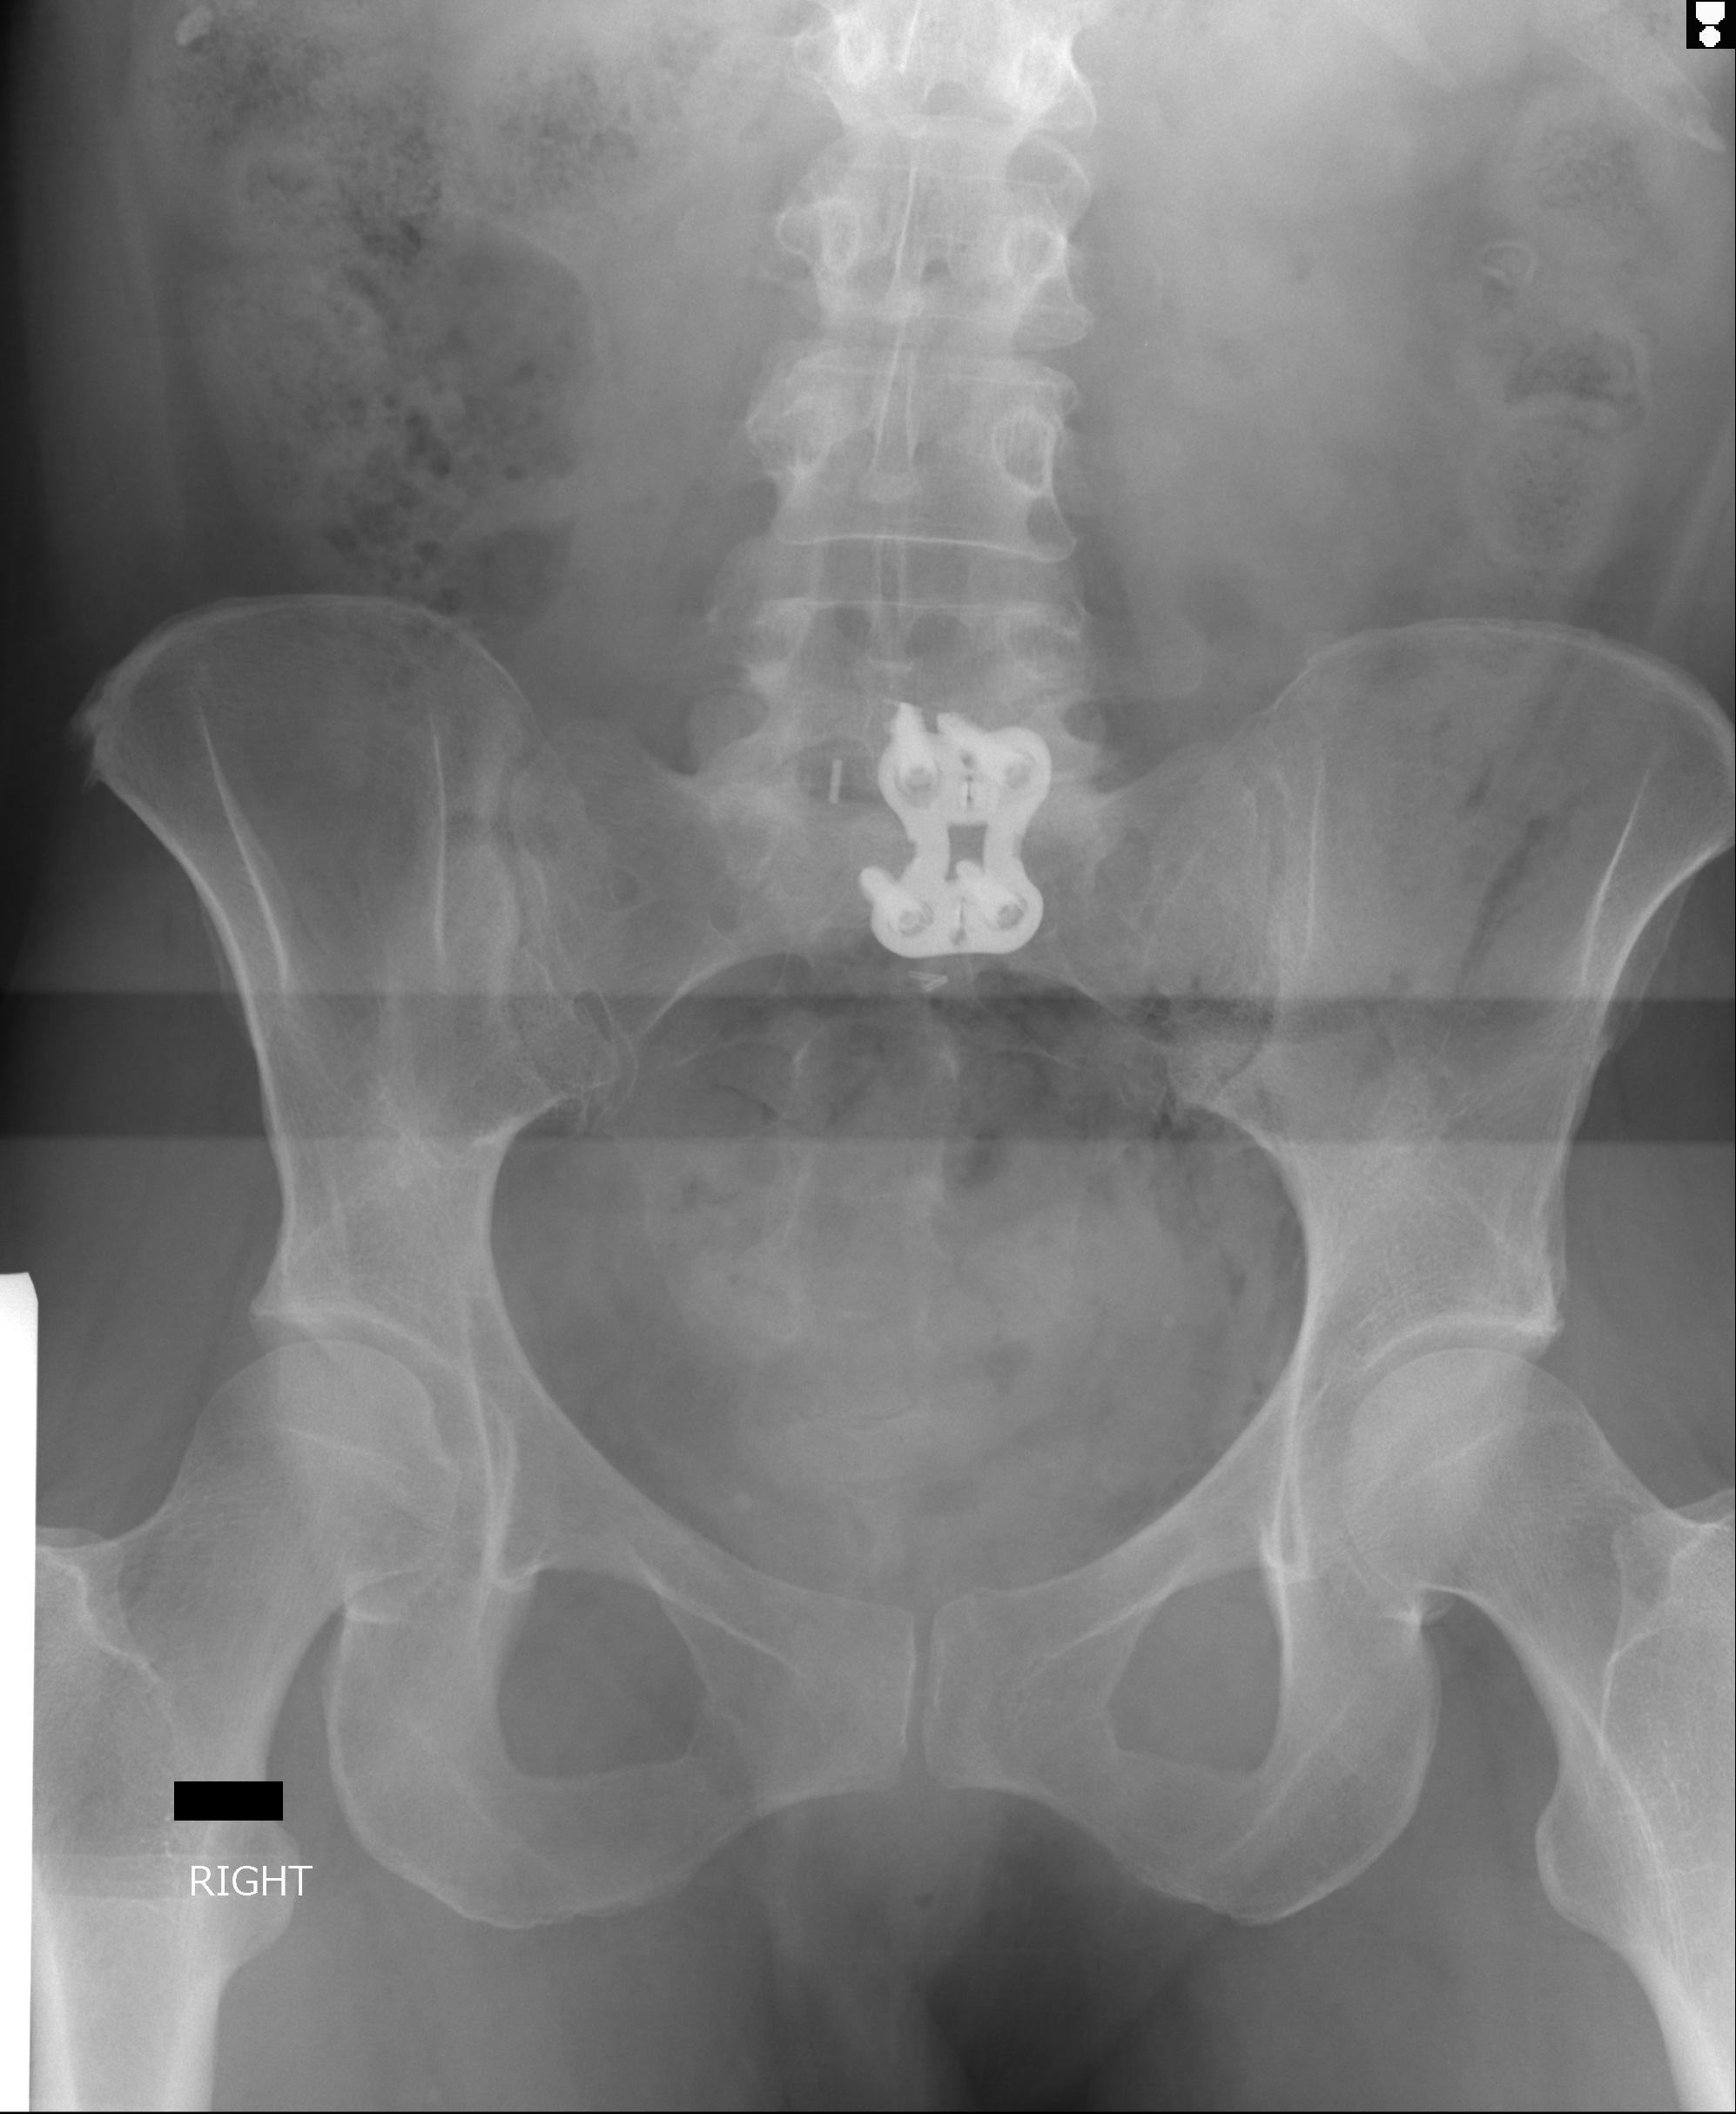

[1 of 1 positions shown; findings below may reference images not displayed]

FINDINGS: There is postoperative change at L5 and S1. There is no retained
needle or surgical instrument. The bowel gas pattern is normal.
There is diffuse stool throughout the colon.
IMPRESSION: Postoperative change at L5-S1. No retained instrument or needle.
Bowel gas pattern unremarkable.

Critical Value/emergent results were called by telephone at the time
of interpretation on 02/19/2016 at [DATE] to Pacik Fendiweddingcar,
circulating nurse in OR, who verbally acknowledged these results.

## 2017-02-19 DIAGNOSIS — M545 Low back pain: Secondary | ICD-10-CM | POA: Diagnosis not present

## 2017-03-21 DIAGNOSIS — M545 Low back pain: Secondary | ICD-10-CM | POA: Diagnosis not present

## 2017-04-21 DIAGNOSIS — M545 Low back pain: Secondary | ICD-10-CM | POA: Diagnosis not present

## 2017-05-21 DIAGNOSIS — M545 Low back pain: Secondary | ICD-10-CM | POA: Diagnosis not present

## 2017-07-22 DIAGNOSIS — M545 Low back pain: Secondary | ICD-10-CM | POA: Diagnosis not present

## 2017-08-19 DIAGNOSIS — M545 Low back pain: Secondary | ICD-10-CM | POA: Diagnosis not present

## 2017-09-19 DIAGNOSIS — M545 Low back pain: Secondary | ICD-10-CM | POA: Diagnosis not present

## 2017-10-19 DIAGNOSIS — M545 Low back pain: Secondary | ICD-10-CM | POA: Diagnosis not present

## 2017-11-19 DIAGNOSIS — M545 Low back pain: Secondary | ICD-10-CM | POA: Diagnosis not present

## 2017-12-19 DIAGNOSIS — M545 Low back pain: Secondary | ICD-10-CM | POA: Diagnosis not present

## 2018-01-19 DIAGNOSIS — M545 Low back pain: Secondary | ICD-10-CM | POA: Diagnosis not present

## 2018-02-19 DIAGNOSIS — M545 Low back pain: Secondary | ICD-10-CM | POA: Diagnosis not present

## 2018-03-15 DIAGNOSIS — R0781 Pleurodynia: Secondary | ICD-10-CM | POA: Diagnosis not present

## 2018-03-21 DIAGNOSIS — M545 Low back pain: Secondary | ICD-10-CM | POA: Diagnosis not present

## 2018-11-16 DIAGNOSIS — M542 Cervicalgia: Secondary | ICD-10-CM | POA: Diagnosis not present

## 2018-11-22 DIAGNOSIS — M542 Cervicalgia: Secondary | ICD-10-CM | POA: Diagnosis not present

## 2018-11-24 DIAGNOSIS — M5032 Other cervical disc degeneration, mid-cervical region, unspecified level: Secondary | ICD-10-CM | POA: Diagnosis not present

## 2018-11-24 DIAGNOSIS — M542 Cervicalgia: Secondary | ICD-10-CM | POA: Diagnosis not present

## 2020-07-22 ENCOUNTER — Other Ambulatory Visit: Payer: Self-pay

## 2020-07-22 ENCOUNTER — Encounter (HOSPITAL_COMMUNITY): Payer: Self-pay | Admitting: Emergency Medicine

## 2020-07-22 ENCOUNTER — Emergency Department (HOSPITAL_COMMUNITY)
Admission: EM | Admit: 2020-07-22 | Discharge: 2020-07-22 | Disposition: A | Discharge status: Home / Self Care | Payer: BC Managed Care – PPO | Attending: Emergency Medicine | Admitting: Emergency Medicine

## 2020-07-22 DIAGNOSIS — N3000 Acute cystitis without hematuria: Secondary | ICD-10-CM | POA: Diagnosis not present

## 2020-07-22 DIAGNOSIS — R630 Anorexia: Secondary | ICD-10-CM | POA: Diagnosis not present

## 2020-07-22 DIAGNOSIS — R1013 Epigastric pain: Secondary | ICD-10-CM

## 2020-07-22 DIAGNOSIS — Z91040 Latex allergy status: Secondary | ICD-10-CM | POA: Insufficient documentation

## 2020-07-22 DIAGNOSIS — R9431 Abnormal electrocardiogram [ECG] [EKG]: Secondary | ICD-10-CM | POA: Diagnosis not present

## 2020-07-22 LAB — URINALYSIS, ROUTINE W REFLEX MICROSCOPIC
Bilirubin Urine: NEGATIVE
Glucose, UA: NEGATIVE mg/dL
Ketones, ur: NEGATIVE mg/dL
Nitrite: POSITIVE — AB
Protein, ur: NEGATIVE mg/dL
Specific Gravity, Urine: 1.015 (ref 1.005–1.030)
pH: 7 (ref 5.0–8.0)

## 2020-07-22 LAB — COMPREHENSIVE METABOLIC PANEL
ALT: 24 U/L (ref 0–44)
AST: 27 U/L (ref 15–41)
Albumin: 4 g/dL (ref 3.5–5.0)
Alkaline Phosphatase: 55 U/L (ref 38–126)
Anion gap: 12 (ref 5–15)
BUN: 14 mg/dL (ref 6–20)
CO2: 23 mmol/L (ref 22–32)
Calcium: 9.6 mg/dL (ref 8.9–10.3)
Chloride: 100 mmol/L (ref 98–111)
Creatinine, Ser: 0.83 mg/dL (ref 0.44–1.00)
GFR, Estimated: >60 mL/min (ref >60)
Glucose, Bld: 128 mg/dL — ABNORMAL HIGH (ref 70–99)
Potassium: 3.7 mmol/L (ref 3.5–5.1)
Sodium: 135 mmol/L (ref 135–145)
Total Bilirubin: 0.7 mg/dL (ref 0.3–1.2)
Total Protein: 7.3 g/dL (ref 6.5–8.1)

## 2020-07-22 LAB — CBC
HCT: 40 % (ref 36.0–46.0)
Hemoglobin: 14.3 g/dL (ref 12.0–15.0)
MCH: 33 pg (ref 26.0–34.0)
MCHC: 35.8 g/dL (ref 30.0–36.0)
MCV: 92.4 fL (ref 80.0–100.0)
Platelets: 321 10*3/uL (ref 150–400)
RBC: 4.33 MIL/uL (ref 3.87–5.11)
RDW: 12.5 % (ref 11.5–15.5)
WBC: 9.5 10*3/uL (ref 4.0–10.5)
nRBC: 0 % (ref 0.0–0.2)

## 2020-07-22 LAB — LIPASE, BLOOD: Lipase: 33 U/L (ref 11–51)

## 2020-07-22 MED ORDER — PANTOPRAZOLE SODIUM 40 MG PO TBEC
40.0000 mg | DELAYED_RELEASE_TABLET | Freq: Once | ORAL | Status: AC
  Administered 2020-07-22: 40 mg via ORAL
  Filled 2020-07-22: qty 1

## 2020-07-22 MED ORDER — SUCRALFATE 1 G PO TABS: 1.0000 g | ORAL_TABLET | Freq: Three times a day (TID) | ORAL | 0 refills | Status: DC

## 2020-07-22 MED ORDER — ALUM & MAG HYDROXIDE-SIMETH 200-200-20 MG/5ML PO SUSP
30.0000 mL | Freq: Once | ORAL | Status: AC
  Administered 2020-07-22: 30 mL via ORAL
  Filled 2020-07-22: qty 30

## 2020-07-22 MED ORDER — CEPHALEXIN 500 MG PO CAPS: 500.0000 mg | ORAL_CAPSULE | Freq: Four times a day (QID) | ORAL | 0 refills | Status: AC

## 2020-07-22 MED ORDER — LIDOCAINE VISCOUS HCL 2 % MT SOLN
15.0000 mL | Freq: Once | OROMUCOSAL | Status: AC
  Administered 2020-07-22: 15 mL via ORAL
  Filled 2020-07-22: qty 15

## 2020-07-22 MED ORDER — ACETAMINOPHEN 500 MG PO TABS
1000.0000 mg | ORAL_TABLET | Freq: Once | ORAL | Status: AC
Start: 2020-07-22 — End: 2020-07-22
  Administered 2020-07-22: 1000 mg via ORAL
  Filled 2020-07-22: qty 2

## 2020-07-22 MED ORDER — ONDANSETRON 4 MG PO TBDP
4.0000 mg | ORAL_TABLET | Freq: Once | ORAL | Status: AC
Start: 2020-07-22 — End: 2020-07-22
  Administered 2020-07-22: 4 mg via ORAL
  Filled 2020-07-22: qty 1

## 2020-07-22 NOTE — ED Triage Notes (Signed)
Pt reports recurrent episodes of nausea and vomiting. Pt points to pain in upper abdomen. VSS.

## 2020-07-22 NOTE — ED Provider Notes (Signed)
[facility] EMERGENCY DEPARTMENT Provider Note   CSN: [id number] Arrival date & time: 07/22/20  1722     History Chief Complaint  Patient presents with  . Abdominal Pain    Patricia Hart is a 60 y.o. female w/ h/o fibromyalgia and previous DVT and PE during pregnancy who presents to the ED for epigastric pain. Patient states she felt something get caught in her chest while eating several days ago. Since that time, she has felt this discomfort and has vomiting several times. Able to tolerate small sips of water but unwilling to eat anything. She also reports some dysuria and thought she may have been dehydrated. Denies fever, chills, cough, chest pain, SOB, diarrhea, or constipation. No previous abdominal surgeries.  The history is provided by the patient and medical records.  Abdominal Pain Pain location:  Epigastric Pain quality: burning   Pain radiates to:  Does not radiate Pain severity:  Mild Onset quality:  Gradual Duration:  3 days Timing:  Intermittent Progression:  Unchanged Chronicity:  New Context: eating   Relieved by:  Nothing Worsened by:  Eating Ineffective treatments:  None tried Associated symptoms: anorexia, dysuria, nausea and vomiting   Associated symptoms: no chest pain, no chills, no constipation, no cough, no diarrhea, no fever, no hematuria, no shortness of breath and no sore throat   Risk factors: no alcohol abuse, has not had multiple surgeries, no NSAID use and not obese        Past Medical History:  Diagnosis Date  . Arthritis   . Blood transfusion without reported diagnosis 1997  . Clotting disorder (HCC) 1997   pregnancy induced dvt- no problems since then  . Fibromyalgia   . GERD (gastroesophageal reflux disease)   . History of DVT (deep vein thrombosis)    during pregnancy  . History of pulmonary embolism    occurred during pregnancy  . Overactive bladder     Patient Active Problem List   Diagnosis Date Noted  .  S/P lumbar spinal fusion 02/19/2016  . Fibromyalgia 08/05/2014  . Routine general medical examination at a health care facility 08/05/2014    Past Surgical History:  Procedure Laterality Date  . ABDOMINAL EXPOSURE N/A 02/19/2016   Procedure: ABDOMINAL EXPOSURE;  Surgeon: Larina Earthly, MD;  Location: [facility];  Service: Vascular;  Laterality: N/A;  . ABLATION  2006   endometrial  . ANTERIOR LUMBAR FUSION N/A 02/19/2016   Procedure: Anterior Lumbar Interbody Fusion  - Lumbar five-sacral one;  Surgeon: Tia Alert, MD;  Location: [facility];  Service: Neurosurgery;  Laterality: N/A;  . BACK SURGERY    . breast implants     saline  . CARPAL TUNNEL RELEASE Right 04/23/2015   Procedure: RIGHT CARPAL TUNNEL RELEASE;  Surgeon: Cindee Salt, MD;  Location: [facility];  Service: Orthopedics;  Laterality: Right;  . CARPAL TUNNEL RELEASE Left 05/16/2015   Procedure: LEFT CARPAL TUNNEL RELEASE;  Surgeon: Cindee Salt, MD;  Location: [facility];  Service: Orthopedics;  Laterality: Left;  . TONSILLECTOMY    . TRIGGER FINGER RELEASE Right 04/23/2015   Procedure: RELEASE TRIGGER FINGER/A-1 PULLEY RIGHT THUMB;  Surgeon: Cindee Salt, MD;  Location: [facility];  Service: Orthopedics;  Laterality: Right;  . WISDOM TOOTH EXTRACTION       OB History    Gravida  1   Para  1   Term  1   Preterm      AB  Living  1     SAB      IAB      Ectopic      Multiple      Live Births  1           Family History  Problem Relation Age of Onset  . Hyperlipidemia Mother   . Cancer Mother        colon; lung  . Osteoporosis Mother   . Diabetes Father   . Hyperlipidemia Father   . Hypertension Father   . Breast cancer Maternal Aunt        25's    Social History   Tobacco Use  . Smoking status: Never Smoker  . Smokeless tobacco: Never Used  Substance Use Topics  . Alcohol use: Yes    Alcohol/week: 6.0 standard drinks    Types: 2  Standard drinks or equivalent, 4 Glasses of wine per week    Comment: 0-4 drinks/wk  . Drug use: No    Home Medications Prior to Admission medications   Medication Sig Start Date End Date Taking? Authorizing Provider  cephALEXin (KEFLEX) 500 MG capsule Take 1 capsule (500 mg total) by mouth 4 (four) times daily for 7 days. 07/22/20 07/29/20 Yes Tonia Brooms, MD  sucralfate (CARAFATE) 1 g tablet Take 1 tablet (1 g total) by mouth 4 (four) times daily -  with meals and at bedtime for 7 days. 07/22/20 07/29/20 Yes Tonia Brooms, MD  acetaminophen (TYLENOL) 500 MG tablet Take 1,500 mg by mouth every 6 (six) hours as needed for mild pain.    [provider]  Cyanocobalamin (VITAMIN B-12 PO) Take 1 tablet by mouth daily.    [provider]  HYDROcodone-acetaminophen (NORCO/VICODIN) 5-325 MG tablet Take 2 tablets by mouth daily as needed for moderate pain. 10/26/16   Myrlene Broker, MD  meloxicam (MOBIC) 15 MG tablet Take 1 tablet by mouth as needed. 03/11/16   [provider]  Multiple Vitamins-Minerals (MULTIVITAMIN PO) Take 1 tablet by mouth daily.    [provider]  omeprazole (PRILOSEC) 20 MG capsule Take 20 mg by mouth daily as needed (acid reflux).     [provider]  pregabalin (LYRICA) 200 MG capsule Take 1 capsule (200 mg total) by mouth 2 (two) times daily. Patient not taking: Reported on 10/26/2016 10/23/16   Myrlene Broker, MD    Allergies    Latex  Review of Systems   Review of Systems  Constitutional: Negative for chills and fever.  HENT: Negative for ear pain and sore throat.   Eyes: Negative for pain and visual disturbance.  Respiratory: Negative for cough and shortness of breath.   Cardiovascular: Negative for chest pain and palpitations.  Gastrointestinal: Positive for abdominal pain, anorexia, nausea and vomiting. Negative for constipation and diarrhea.  Genitourinary: Positive for dysuria. Negative for hematuria.   Musculoskeletal: Negative for arthralgias and back pain.  Skin: Negative for color change and rash.  Neurological: Negative for seizures and syncope.  All other systems reviewed and are negative.   Physical Exam Updated Vital Signs BP 115/68   Pulse 80   Temp 98.8 F (37.1 C) (Oral)   Resp 16   SpO2 94%   Physical Exam Vitals and nursing note reviewed.  Constitutional:      General: She is awake. She is not in acute distress.    Appearance: She is well-developed, well-groomed and well-nourished. She is not ill-appearing.  HENT:     Head: Normocephalic and  atraumatic.     Right Ear: External ear normal.     Left Ear: External ear normal.     Nose: Nose normal.  Eyes:     General: No scleral icterus.       Right eye: No discharge.     Conjunctiva/sclera: Conjunctivae normal.  Cardiovascular:     Rate and Rhythm: Normal rate and regular rhythm.     Pulses: Normal pulses.     Heart sounds: Normal heart sounds. No murmur heard.   Pulmonary:     Effort: Pulmonary effort is normal. No respiratory distress.     Breath sounds: Normal breath sounds. No wheezing, rhonchi or rales.  Abdominal:     General: Abdomen is flat. There is no distension.     Palpations: Abdomen is soft.     Tenderness: There is no abdominal tenderness. There is no guarding or rebound.  Musculoskeletal:        General: No edema.     Cervical back: Neck supple.     Right lower leg: No edema.     Left lower leg: No edema.  Skin:    General: Skin is warm and dry.     Findings: No rash.  Neurological:     General: No focal deficit present.     Mental Status: She is alert and oriented to person, place, and time.     Sensory: No sensory deficit.     Motor: No weakness.  Psychiatric:        Mood and Affect: Mood and affect and mood normal.        Behavior: Behavior normal. Behavior is cooperative.     ED Results / Procedures / Treatments   Labs (all labs ordered are listed, but only abnormal  results are displayed) Labs Reviewed  COMPREHENSIVE METABOLIC PANEL - Abnormal; Notable for the following components:      Result Value   Glucose, Bld 128 (*)    All other components within normal limits  URINALYSIS, ROUTINE W REFLEX MICROSCOPIC - Abnormal; Notable for the following components:   APPearance HAZY (*)    Hgb urine dipstick MODERATE (*)    Nitrite POSITIVE (*)    Leukocytes,Ua SMALL (*)    Bacteria, UA MANY (*)    All other components within normal limits  LIPASE, BLOOD  CBC    EKG None  Radiology No results found.  Procedures Procedures  Medications Ordered in ED Medications  ondansetron (ZOFRAN-ODT) disintegrating tablet 4 mg (4 mg Oral Given 07/22/20 2222)  alum & mag hydroxide-simeth (MAALOX/MYLANTA) 200-200-20 MG/5ML suspension 30 mL (30 mLs Oral Given 07/22/20 2228)    And  lidocaine (XYLOCAINE) 2 % viscous mouth solution 15 mL (15 mLs Oral Given 07/22/20 2225)  pantoprazole (PROTONIX) EC tablet 40 mg (40 mg Oral Given 07/22/20 2223)  acetaminophen (TYLENOL) tablet 1,000 mg (1,000 mg Oral Given 07/22/20 2220)    ED Course  I have reviewed the triage vital signs and the nursing notes.  Pertinent labs & imaging results that were available during my care of the patient were reviewed by me and considered in my medical decision making (see chart for details).    MDM Rules/Calculators/A&P                          Patient is a 59yoF with history and physical as described above who presents to the ED for epigastric abdominal pain and dysuria. VS reassuring and HDS. Patient resting  comfortably and in no acute distress. Initial workup included CBC, CMP, lipase, and UA. Initial treatment includes Maalox, viscous lidocaine, Protonix, and Zofran.  On reassessment, symptoms improved and patient tolerating PO. Repeat abdominal exam benign. UA consistent for UTI. Other lab work reassuring. Suspect presentation likely acute gastritis vs post-obstructive esophagitis vs  GERD. Doubt food bolus, esophageal rupture, cholecystitis, pancreatitis, SBO, ACS, PNA, aspiration, pyelonephritis, nephrolithiasis, or other acute emergent pathology at this time. Recommend close follow up with PCP for GI referral for potential EGD outpatient. Will prescribe Carafate as well as Keflex for UTI. Strict return precautions provided and discussed. Questions and concerns addressed. Patient verbalized and amenable with discharge plan. Discharged in stable condition.  Final Clinical Impression(s) / ED Diagnoses Final diagnoses:  Epigastric pain  Acute cystitis without hematuria    Rx / DC Orders ED Discharge Orders         Ordered    sucralfate (CARAFATE) 1 g tablet  3 times daily with meals & bedtime        07/22/20 2301    cephALEXin (KEFLEX) 500 MG capsule  4 times daily        07/22/20 2301           Tonia BroomsKeith, Kijuana Ruppel, MD 07/22/20 2314    Maia PlanLong, Joshua G, MD 07/23/20 [id number]

## 2020-08-07 DIAGNOSIS — R1310 Dysphagia, unspecified: Secondary | ICD-10-CM | POA: Diagnosis not present

## 2020-08-07 DIAGNOSIS — M5136 Other intervertebral disc degeneration, lumbar region: Secondary | ICD-10-CM | POA: Diagnosis not present

## 2020-08-07 DIAGNOSIS — K219 Gastro-esophageal reflux disease without esophagitis: Secondary | ICD-10-CM | POA: Diagnosis not present

## 2020-08-12 DIAGNOSIS — R1319 Other dysphagia: Secondary | ICD-10-CM | POA: Diagnosis not present

## 2020-08-12 DIAGNOSIS — Z8719 Personal history of other diseases of the digestive system: Secondary | ICD-10-CM | POA: Diagnosis not present

## 2020-08-12 DIAGNOSIS — K219 Gastro-esophageal reflux disease without esophagitis: Secondary | ICD-10-CM | POA: Diagnosis not present

## 2020-08-12 DIAGNOSIS — Z800 Family history of malignant neoplasm of digestive organs: Secondary | ICD-10-CM | POA: Diagnosis not present

## 2020-08-22 DIAGNOSIS — Z01812 Encounter for preprocedural laboratory examination: Secondary | ICD-10-CM | POA: Diagnosis not present

## 2020-08-27 DIAGNOSIS — K2930 Chronic superficial gastritis without bleeding: Secondary | ICD-10-CM | POA: Diagnosis not present

## 2020-08-27 DIAGNOSIS — R12 Heartburn: Secondary | ICD-10-CM | POA: Diagnosis not present

## 2020-08-27 DIAGNOSIS — R1310 Dysphagia, unspecified: Secondary | ICD-10-CM | POA: Diagnosis not present

## 2020-08-27 DIAGNOSIS — B3781 Candidal esophagitis: Secondary | ICD-10-CM | POA: Diagnosis not present

## 2020-08-27 DIAGNOSIS — K449 Diaphragmatic hernia without obstruction or gangrene: Secondary | ICD-10-CM | POA: Diagnosis not present

## 2020-08-27 DIAGNOSIS — K315 Obstruction of duodenum: Secondary | ICD-10-CM | POA: Diagnosis not present

## 2021-01-16 ENCOUNTER — Other Ambulatory Visit: Payer: Self-pay

## 2021-01-16 ENCOUNTER — Encounter (HOSPITAL_BASED_OUTPATIENT_CLINIC_OR_DEPARTMENT_OTHER): Payer: Self-pay

## 2021-01-16 ENCOUNTER — Emergency Department (HOSPITAL_BASED_OUTPATIENT_CLINIC_OR_DEPARTMENT_OTHER)
Admission: EM | Admit: 2021-01-16 | Discharge: 2021-01-17 | Disposition: A | Discharge status: Home / Self Care | Payer: BC Managed Care – PPO | Attending: Emergency Medicine | Admitting: Emergency Medicine

## 2021-01-16 ENCOUNTER — Emergency Department (HOSPITAL_BASED_OUTPATIENT_CLINIC_OR_DEPARTMENT_OTHER): Payer: BC Managed Care – PPO

## 2021-01-16 DIAGNOSIS — R109 Unspecified abdominal pain: Secondary | ICD-10-CM | POA: Diagnosis not present

## 2021-01-16 DIAGNOSIS — R112 Nausea with vomiting, unspecified: Secondary | ICD-10-CM | POA: Insufficient documentation

## 2021-01-16 DIAGNOSIS — R1013 Epigastric pain: Secondary | ICD-10-CM | POA: Diagnosis not present

## 2021-01-16 DIAGNOSIS — K219 Gastro-esophageal reflux disease without esophagitis: Secondary | ICD-10-CM | POA: Insufficient documentation

## 2021-01-16 DIAGNOSIS — R1110 Vomiting, unspecified: Secondary | ICD-10-CM | POA: Diagnosis not present

## 2021-01-16 DIAGNOSIS — Z91040 Latex allergy status: Secondary | ICD-10-CM | POA: Diagnosis not present

## 2021-01-16 LAB — CBC WITH DIFFERENTIAL/PLATELET
Abs Immature Granulocytes: 0.06 10*3/uL (ref 0.00–0.07)
Basophils Absolute: 0 10*3/uL (ref 0.0–0.1)
Basophils Relative: 0 %
Eosinophils Absolute: 0 10*3/uL (ref 0.0–0.5)
Eosinophils Relative: 0 %
HCT: 40.4 % (ref 36.0–46.0)
Hemoglobin: 14.4 g/dL (ref 12.0–15.0)
Immature Granulocytes: 1 %
Lymphocytes Relative: 14 %
Lymphs Abs: 1.5 10*3/uL (ref 0.7–4.0)
MCH: 32.2 pg (ref 26.0–34.0)
MCHC: 35.6 g/dL (ref 30.0–36.0)
MCV: 90.4 fL (ref 80.0–100.0)
Monocytes Absolute: 0.4 10*3/uL (ref 0.1–1.0)
Monocytes Relative: 4 %
Neutro Abs: 8.9 10*3/uL — ABNORMAL HIGH (ref 1.7–7.7)
Neutrophils Relative %: 81 %
Platelets: 308 10*3/uL (ref 150–400)
RBC: 4.47 MIL/uL (ref 3.87–5.11)
RDW: 14 % (ref 11.5–15.5)
WBC: 10.9 10*3/uL — ABNORMAL HIGH (ref 4.0–10.5)
nRBC: 0 % (ref 0.0–0.2)

## 2021-01-16 LAB — COMPREHENSIVE METABOLIC PANEL
ALT: 25 U/L (ref 0–44)
AST: 24 U/L (ref 15–41)
Albumin: 4.6 g/dL (ref 3.5–5.0)
Alkaline Phosphatase: 44 U/L (ref 38–126)
Anion gap: 15 (ref 5–15)
BUN: 13 mg/dL (ref 6–20)
CO2: 24 mmol/L (ref 22–32)
Calcium: 9.7 mg/dL (ref 8.9–10.3)
Chloride: 97 mmol/L — ABNORMAL LOW (ref 98–111)
Creatinine, Ser: 0.7 mg/dL (ref 0.44–1.00)
GFR, Estimated: >60 mL/min (ref >60)
Glucose, Bld: 140 mg/dL — ABNORMAL HIGH (ref 70–99)
Potassium: 3.6 mmol/L (ref 3.5–5.1)
Sodium: 136 mmol/L (ref 135–145)
Total Bilirubin: 0.6 mg/dL (ref 0.3–1.2)
Total Protein: 7.6 g/dL (ref 6.5–8.1)

## 2021-01-16 LAB — LIPASE, BLOOD: Lipase: 25 U/L (ref 11–51)

## 2021-01-16 MED ORDER — HALOPERIDOL LACTATE 5 MG/ML IJ SOLN: 5.0000 mg | Freq: Once | INTRAMUSCULAR | Status: DC

## 2021-01-16 MED ORDER — SODIUM CHLORIDE 0.9 % IV SOLN
25.0000 mg | Freq: Four times a day (QID) | INTRAVENOUS | Status: DC | PRN
  Administered 2021-01-16: 25 mg via INTRAVENOUS
  Filled 2021-01-16: qty 1

## 2021-01-16 MED ORDER — SUCRALFATE 1 G PO TABS: 1.0000 g | ORAL_TABLET | Freq: Three times a day (TID) | ORAL | 0 refills | Status: DC

## 2021-01-16 MED ORDER — METOCLOPRAMIDE HCL 5 MG/ML IJ SOLN
10.0000 mg | Freq: Once | INTRAMUSCULAR | Status: DC
  Filled 2021-01-16: qty 2

## 2021-01-16 MED ORDER — SODIUM CHLORIDE 0.9 % IV BOLUS
1000.0000 mL | Freq: Once | INTRAVENOUS | Status: AC
  Administered 2021-01-16: 1000 mL via INTRAVENOUS

## 2021-01-16 MED ORDER — DIPHENHYDRAMINE HCL 50 MG/ML IJ SOLN
50.0000 mg | Freq: Once | INTRAMUSCULAR | Status: DC
  Filled 2021-01-16: qty 1

## 2021-01-16 MED ORDER — PROMETHAZINE HCL 12.5 MG PO TABS
12.5000 mg | ORAL_TABLET | Freq: Four times a day (QID) | ORAL | 0 refills | Status: DC | PRN
Start: 2021-01-16 — End: 2021-03-17

## 2021-01-16 MED ORDER — IOHEXOL 300 MG/ML  SOLN
75.0000 mL | Freq: Once | INTRAMUSCULAR | Status: AC | PRN
  Administered 2021-01-16: 75 mL via INTRAVENOUS

## 2021-01-16 MED ORDER — FAMOTIDINE IN NACL 20-0.9 MG/50ML-% IV SOLN
20.0000 mg | Freq: Once | INTRAVENOUS | Status: DC
  Filled 2021-01-16: qty 50

## 2021-01-16 MED ORDER — PROMETHAZINE HCL 25 MG/ML IJ SOLN
INTRAMUSCULAR | Status: AC
  Filled 2021-01-16: qty 1

## 2021-01-16 MED ORDER — HYDROMORPHONE HCL 1 MG/ML IJ SOLN
1.0000 mg | Freq: Once | INTRAMUSCULAR | Status: AC
  Administered 2021-01-16: 1 mg via INTRAVENOUS
  Filled 2021-01-16: qty 1

## 2021-01-16 NOTE — ED Notes (Signed)
Patient placed on 2L [place] due to decrease in oxygen saturations to 88%. Patient oxygen saturations increased to 94%. Patient tolerating well.

## 2021-01-16 NOTE — ED Provider Notes (Signed)
[facility] EMERGENCY DEPARTMENT Provider Note   CSN: [id number] Arrival date & time: 01/16/21  1856     History Chief Complaint  Patient presents with   Abdominal Pain    Patricia Hart is a 60 y.o. female history of DVT not on anticoagulation, chronic abdominal pain here presenting with persistent abdominal pain and vomiting.  Patient states that this is a recurrent problem.  She has been having epigastric pain for the last several years.  She had a endoscopy previously that showed esophagitis.  Patient states that she is not happy with her GI doctor and is in the process of switching to another GI doctor.  She states that for the last week or so, she has been vomiting and unable to keep anything down.  She states that she has been dehydrated.  She complains of severe epigastric pain.  Patient states that she was on sucralfate before and that has helped.  She is taking Protonix currently  The history is provided by the patient.      Past Medical History:  Diagnosis Date   Arthritis    Blood transfusion without reported diagnosis 1997   Clotting disorder (HCC) 1997   pregnancy induced dvt- no problems since then   Fibromyalgia    GERD (gastroesophageal reflux disease)    History of DVT (deep vein thrombosis)    during pregnancy   History of pulmonary embolism    occurred during pregnancy   Overactive bladder     Patient Active Problem List   Diagnosis Date Noted   S/P lumbar spinal fusion 02/19/2016   Fibromyalgia 08/05/2014   Routine general medical examination at a health care facility 08/05/2014    Past Surgical History:  Procedure Laterality Date   ABDOMINAL EXPOSURE N/A 02/19/2016   Procedure: ABDOMINAL EXPOSURE;  Surgeon: Larina Earthly, MD;  Location: [facility];  Service: Vascular;  Laterality: N/A;   ABLATION  2006   endometrial   ANTERIOR LUMBAR FUSION N/A 02/19/2016   Procedure: Anterior Lumbar Interbody Fusion  - Lumbar five-sacral one;  Surgeon:  Tia Alert, MD;  Location: [facility];  Service: Neurosurgery;  Laterality: N/A;   BACK SURGERY     breast implants     saline   CARPAL TUNNEL RELEASE Right 04/23/2015   Procedure: RIGHT CARPAL TUNNEL RELEASE;  Surgeon: Cindee Salt, MD;  Location: [facility];  Service: Orthopedics;  Laterality: Right;   CARPAL TUNNEL RELEASE Left 05/16/2015   Procedure: LEFT CARPAL TUNNEL RELEASE;  Surgeon: Cindee Salt, MD;  Location: [facility];  Service: Orthopedics;  Laterality: Left;   TONSILLECTOMY     TRIGGER FINGER RELEASE Right 04/23/2015   Procedure: RELEASE TRIGGER FINGER/A-1 PULLEY RIGHT THUMB;  Surgeon: Cindee Salt, MD;  Location:  SUR[facility]ERY [facility];  Service: Orthopedics;  Laterality: Right;   WISDOM TOOTH EXTRACTION       OB History     Gravida  1   Para  1   Term  1   Preterm      AB      Living  1      SAB      IAB      Ectopic      Multiple      Live Births  1           Family History  Problem Relation Age of Onset   Hyperlipidemia Mother    Cancer Mother  colon; lung   Osteoporosis Mother    Diabetes Father    Hyperlipidemia Father    Hypertension Father    Breast cancer Maternal Aunt        19's    Social History   Tobacco Use   Smoking status: Never   Smokeless tobacco: Never  Vaping Use   Vaping Use: Never used  Substance Use Topics   Alcohol use: Yes    Alcohol/week: 6.0 standard drinks    Types: 4 Glasses of wine, 2 Standard drinks or equivalent per week    Comment: occ   Drug use: No    Home Medications Prior to Admission medications   Medication Sig Start Date End Date Taking? Authorizing Provider  acetaminophen (TYLENOL) 500 MG tablet Take 1,500 mg by mouth every 6 (six) hours as needed for mild pain.    [provider]  Cyanocobalamin (VITAMIN B-12 PO) Take 1 tablet by mouth daily.    [provider]  HYDROcodone-acetaminophen (NORCO/VICODIN) 5-325 MG tablet Take 2  tablets by mouth daily as needed for moderate pain. 10/26/16   Myrlene Broker, MD  meloxicam (MOBIC) 15 MG tablet Take 1 tablet by mouth as needed. 03/11/16   [provider]  Multiple Vitamins-Minerals (MULTIVITAMIN PO) Take 1 tablet by mouth daily.    [provider]  omeprazole (PRILOSEC) 20 MG capsule Take 20 mg by mouth daily as needed (acid reflux).     [provider]  pregabalin (LYRICA) 200 MG capsule Take 1 capsule (200 mg total) by mouth 2 (two) times daily. Patient not taking: Reported on 10/26/2016 10/23/16   Myrlene Broker, MD  sucralfate (CARAFATE) 1 g tablet Take 1 tablet (1 g total) by mouth 4 (four) times daily -  with meals and at bedtime for 7 days. 07/22/20 07/29/20  Tonia Brooms, MD    Allergies    Latex  Review of Systems   Review of Systems  Gastrointestinal:  Positive for abdominal pain and vomiting.  All other systems reviewed and are negative.  Physical Exam Updated Vital Signs BP (!) 149/71   Pulse (!) 54   Temp 98.5 F (36.9 C) (Oral)   Resp 10   Ht 5\' 4"  (1.626 m)   Wt 55.8 kg   SpO2 100%   BMI 21.11 kg/m   Physical Exam Vitals and nursing note reviewed.  Constitutional:      Comments: Uncomfortable and vomiting  HENT:     Head: Normocephalic.     Mouth/Throat:     Pharynx: Oropharynx is clear.  Eyes:     Extraocular Movements: Extraocular movements intact.     Pupils: Pupils are equal, round, and reactive to light.  Cardiovascular:     Rate and Rhythm: Normal rate and regular rhythm.  Abdominal:     Comments: + epigastric tenderness   Skin:    General: Skin is warm.     Capillary Refill: Capillary refill takes less than 2 seconds.  Neurological:     General: No focal deficit present.     Mental Status: She is oriented to person, place, and time.  Psychiatric:        Mood and Affect: Mood normal.        Behavior: Behavior normal.    ED Results / Procedures / Treatments   Labs (all labs ordered are  listed, but only abnormal results are displayed) Labs Reviewed  COMPREHENSIVE METABOLIC PANEL - Abnormal; Notable for the following components:  Result Value   Chloride 97 (*)    Glucose, Bld 140 (*)    All other components within normal limits  CBC WITH DIFFERENTIAL/PLATELET - Abnormal; Notable for the following components:   WBC 10.9 (*)    Neutro Abs 8.9 (*)    All other components within normal limits  LIPASE, BLOOD  URINALYSIS, ROUTINE W REFLEX MICROSCOPIC    EKG EKG Interpretation  Date/Time:  Thursday January 16 2021 21:30:31 EDT Ventricular Rate:  55 PR Interval:  150 QRS Duration: 85 QT Interval:  430 QTC Calculation: 412 R Axis:   44 Text Interpretation: Sinus rhythm No significant change since last tracing Confirmed by Richardean Canal [phone]) on 01/16/2021 10:21:13 PM  Radiology No results found.  Procedures Procedures   Medications Ordered in ED Medications  HYDROmorphone (DILAUDID) injection 1 mg (has no administration in time range)  promethazine (PHENERGAN) 25 mg in sodium chloride 0.9 % 50 mL IVPB (25 mg Intravenous New Bag/Given 01/16/21 2148)  sodium chloride 0.9 % bolus 1,000 mL (1,000 mLs Intravenous New Bag/Given 01/16/21 2129)  iohexol (OMNIPAQUE) 300 MG/ML solution 75 mL (75 mLs Intravenous Contrast Given 01/16/21 2216)    ED Course  I have reviewed the triage vital signs and the nursing notes.  Pertinent labs & imaging results that were available during my care of the patient were reviewed by me and considered in my medical decision making (see chart for details).    MDM Rules/Calculators/A&P                           Patricia Hart is a 60 y.o. female here with epigastric pain and vomiting.  Patient is hyperventilating and appears to be very uncomfortable on exam.  Plan to get CBC and CMP and lipase and CT abdomen pelvis to rule out intra-abdominal pathology.  We will also give pain medicine and fluids and nausea medicine  11:03 PM Patient is  feeling much better now.  No vomiting after given nausea medicine.  Patient states that she has a GI doctor she wants to follow-up with.  Will discharge patient home with Carafate.  Patient is already on Protonix.  We will also give some phenergan as needed   Final Clinical Impression(s) / ED Diagnoses Final diagnoses:  None    Rx / DC Orders ED Discharge Orders     None        Charlynne Pander, MD 01/16/21 2304

## 2021-01-16 NOTE — ED Notes (Signed)
Patient transported to CT 

## 2021-01-16 NOTE — Discharge Instructions (Addendum)
Continue taking Protonix  Take sucralfate as prescribed   Take phenergan for nausea   Stay hydrated   See your GI doctor   Return to ER if you have worse abdominal pain, vomiting, fever

## 2021-01-16 NOTE — ED Triage Notes (Addendum)
Pt c/o abd pain, n/v x "several years"-states she has been seen by GI for c/o with last endoscopy in May-states she is looking for a new GI MD-pt hyperventilating-encouraged to take slow deep breaths-steady gait

## 2021-01-21 ENCOUNTER — Emergency Department (HOSPITAL_BASED_OUTPATIENT_CLINIC_OR_DEPARTMENT_OTHER)
Admission: EM | Admit: 2021-01-21 | Discharge: 2021-01-21 | Disposition: A | Discharge status: Home / Self Care | Payer: BC Managed Care – PPO | Attending: Emergency Medicine | Admitting: Emergency Medicine

## 2021-01-21 ENCOUNTER — Emergency Department (HOSPITAL_BASED_OUTPATIENT_CLINIC_OR_DEPARTMENT_OTHER): Payer: BC Managed Care – PPO

## 2021-01-21 ENCOUNTER — Encounter: Payer: Self-pay | Admitting: Gastroenterology

## 2021-01-21 ENCOUNTER — Other Ambulatory Visit: Payer: Self-pay

## 2021-01-21 ENCOUNTER — Encounter (HOSPITAL_BASED_OUTPATIENT_CLINIC_OR_DEPARTMENT_OTHER): Payer: Self-pay

## 2021-01-21 DIAGNOSIS — R112 Nausea with vomiting, unspecified: Secondary | ICD-10-CM | POA: Diagnosis not present

## 2021-01-21 DIAGNOSIS — Z91040 Latex allergy status: Secondary | ICD-10-CM | POA: Insufficient documentation

## 2021-01-21 DIAGNOSIS — R1013 Epigastric pain: Secondary | ICD-10-CM | POA: Insufficient documentation

## 2021-01-21 DIAGNOSIS — Z86718 Personal history of other venous thrombosis and embolism: Secondary | ICD-10-CM | POA: Diagnosis not present

## 2021-01-21 DIAGNOSIS — R1011 Right upper quadrant pain: Secondary | ICD-10-CM | POA: Insufficient documentation

## 2021-01-21 DIAGNOSIS — K7689 Other specified diseases of liver: Secondary | ICD-10-CM | POA: Diagnosis not present

## 2021-01-21 LAB — COMPREHENSIVE METABOLIC PANEL
ALT: 39 U/L (ref 0–44)
AST: 30 U/L (ref 15–41)
Albumin: 4.9 g/dL (ref 3.5–5.0)
Alkaline Phosphatase: 51 U/L (ref 38–126)
Anion gap: 13 (ref 5–15)
BUN: 19 mg/dL (ref 6–20)
CO2: 25 mmol/L (ref 22–32)
Calcium: 10.2 mg/dL (ref 8.9–10.3)
Chloride: 97 mmol/L — ABNORMAL LOW (ref 98–111)
Creatinine, Ser: 0.68 mg/dL (ref 0.44–1.00)
GFR, Estimated: >60 mL/min (ref >60)
Glucose, Bld: 144 mg/dL — ABNORMAL HIGH (ref 70–99)
Potassium: 3.7 mmol/L (ref 3.5–5.1)
Sodium: 135 mmol/L (ref 135–145)
Total Bilirubin: 0.4 mg/dL (ref 0.3–1.2)
Total Protein: 8.2 g/dL — ABNORMAL HIGH (ref 6.5–8.1)

## 2021-01-21 LAB — CBC WITH DIFFERENTIAL/PLATELET
Abs Immature Granulocytes: 0.08 10*3/uL — ABNORMAL HIGH (ref 0.00–0.07)
Basophils Absolute: 0 10*3/uL (ref 0.0–0.1)
Basophils Relative: 0 %
Eosinophils Absolute: 0 10*3/uL (ref 0.0–0.5)
Eosinophils Relative: 0 %
HCT: 43.8 % (ref 36.0–46.0)
Hemoglobin: 15.5 g/dL — ABNORMAL HIGH (ref 12.0–15.0)
Immature Granulocytes: 1 %
Lymphocytes Relative: 12 %
Lymphs Abs: 1.6 10*3/uL (ref 0.7–4.0)
MCH: 32.2 pg (ref 26.0–34.0)
MCHC: 35.4 g/dL (ref 30.0–36.0)
MCV: 90.9 fL (ref 80.0–100.0)
Monocytes Absolute: 0.4 10*3/uL (ref 0.1–1.0)
Monocytes Relative: 3 %
Neutro Abs: 11.3 10*3/uL — ABNORMAL HIGH (ref 1.7–7.7)
Neutrophils Relative %: 84 %
Platelets: 348 10*3/uL (ref 150–400)
RBC: 4.82 MIL/uL (ref 3.87–5.11)
RDW: 13.6 % (ref 11.5–15.5)
WBC: 13.4 10*3/uL — ABNORMAL HIGH (ref 4.0–10.5)
nRBC: 0 % (ref 0.0–0.2)

## 2021-01-21 LAB — LIPASE, BLOOD: Lipase: 27 U/L (ref 11–51)

## 2021-01-21 MED ORDER — DIPHENHYDRAMINE HCL 50 MG/ML IJ SOLN
25.0000 mg | Freq: Once | INTRAMUSCULAR | Status: AC
  Administered 2021-01-21: 25 mg via INTRAVENOUS
  Filled 2021-01-21: qty 1

## 2021-01-21 MED ORDER — METOCLOPRAMIDE HCL 5 MG/ML IJ SOLN
10.0000 mg | Freq: Once | INTRAMUSCULAR | Status: AC
  Administered 2021-01-21: 10 mg via INTRAVENOUS
  Filled 2021-01-21: qty 2

## 2021-01-21 MED ORDER — METOCLOPRAMIDE HCL 10 MG PO TABS: 10.0000 mg | ORAL_TABLET | Freq: Four times a day (QID) | ORAL | 0 refills | Status: DC | PRN

## 2021-01-21 MED ORDER — LACTATED RINGERS IV BOLUS
1000.0000 mL | Freq: Once | INTRAVENOUS | Status: AC
  Administered 2021-01-21: 1000 mL via INTRAVENOUS

## 2021-01-21 MED ORDER — ONDANSETRON HCL 4 MG/2ML IJ SOLN
4.0000 mg | Freq: Once | INTRAMUSCULAR | Status: AC
  Administered 2021-01-21: 4 mg via INTRAVENOUS
  Filled 2021-01-21: qty 2

## 2021-01-21 NOTE — ED Triage Notes (Signed)
Pt c/o abd pain, n/v x 1+ year-states "it's a long story"-grimacing/moving around in chair

## 2021-01-21 NOTE — ED Provider Notes (Signed)
[facility] EMERGENCY DEPARTMENT Provider Note   CSN: [id number] Arrival date & time: 01/21/21  1637     History Chief Complaint  Patient presents with   Abdominal Pain    Patricia Hart is a 60 y.o. 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 The patient states that she has had this pain intermittently for years and has been in this current episdoe for the past two weeks. She denies any hematemesis, diarrhea, or constipation. She states that she is not able to keep anything down, but is still urinating. The patient was referred to GI, but was not happy with the provider and is looking to switch. She is currently taking Protonix and Sucralfate.    Abdominal Pain Associated symptoms: nausea and vomiting   Associated symptoms: no chest pain, no constipation, no cough, no diarrhea, no dysuria, no fatigue, no fever, no hematuria, no shortness of breath and no sore throat       Past Medical History:  Diagnosis Date   Arthritis    Blood transfusion without reported diagnosis 1997   Clotting disorder (HCC) 1997   pregnancy induced dvt- no problems since then   Fibromyalgia    GERD (gastroesophageal reflux disease)    History of DVT (deep vein thrombosis)    during pregnancy   History of pulmonary embolism    occurred during pregnancy   Overactive bladder     Patient Active Problem List   Diagnosis Date Noted   S/P lumbar spinal fusion 02/19/2016   Fibromyalgia 08/05/2014   Routine general medical examination at a health care facility 08/05/2014    Past Surgical History:  Procedure Laterality Date   ABDOMINAL EXPOSURE N/A 02/19/2016   Procedure: ABDOMINAL EXPOSURE;  Surgeon: Larina Earthly, MD;  Location: [facility];  Service: Vascular;  Laterality: N/A;   ABLATION  2006   endometrial   ANTERIOR LUMBAR FUSION N/A 02/19/2016   Procedure: Anterior Lumbar Interbody  Fusion  - Lumbar five-sacral one;  Surgeon: Tia Alert, MD;  Location: [facility];  Service: Neurosurgery;  Laterality: N/A;   BACK SURGERY     breast implants     saline   CARPAL TUNNEL RELEASE Right 04/23/2015   Procedure: RIGHT CARPAL TUNNEL RELEASE;  Surgeon: Cindee Salt, MD;  Location: [facility];  Service: Orthopedics;  Laterality: Right;   CARPAL TUNNEL RELEASE Left 05/16/2015   Procedure: LEFT CARPAL TUNNEL RELEASE;  Surgeon: Cindee Salt, MD;  Location: [facility];  Service: Orthopedics;  Laterality: Left;   TONSILLECTOMY     TRIGGER FINGER RELEASE Right 04/23/2015   Procedure: RELEASE TRIGGER FINGER/A-1 PULLEY RIGHT THUMB;  Surgeon: Cindee Salt, MD;  Location: [facility];  Service: Orthopedics;  Laterality: Right;   WISDOM TOOTH EXTRACTION       OB History     Gravida  1   Para  1   Term  1   Preterm      AB      Living  1      SAB      IAB      Ectopic      Multiple      Live Births  1           Family History  Problem Relation Age of Onset   Hyperlipidemia Mother    Cancer Mother  colon; lung   Osteoporosis Mother    Diabetes Father    Hyperlipidemia Father    Hypertension Father    Breast cancer Maternal Aunt        6150's    Social History   Tobacco Use   Smoking status: Never   Smokeless tobacco: Never  Vaping Use   Vaping Use: Never used  Substance Use Topics   Alcohol use: Yes    Alcohol/week: 6.0 standard drinks    Types: 4 Glasses of wine, 2 Standard drinks or equivalent per week    Comment: occ   Drug use: No    Home Medications Prior to Admission medications   Medication Sig Start Date End Date Taking? Authorizing Provider  metoCLOPramide (REGLAN) 10 MG tablet Take 1 tablet (10 mg total) by mouth every 6 (six) hours as needed for nausea or vomiting. 01/21/21  Yes Achille Richansom, Mikhaila Roh, PA-C  acetaminophen (TYLENOL) 500 MG tablet Take 1,500 mg by mouth every 6 (six) hours as  needed for mild pain.    [provider]  Cyanocobalamin (VITAMIN B-12 PO) Take 1 tablet by mouth daily.    [provider]  HYDROcodone-acetaminophen (NORCO/VICODIN) 5-325 MG tablet Take 2 tablets by mouth daily as needed for moderate pain. 10/26/16   Myrlene Brokerrawford, Elizabeth A, MD  meloxicam (MOBIC) 15 MG tablet Take 1 tablet by mouth as needed. 03/11/16   [provider]  Multiple Vitamins-Minerals (MULTIVITAMIN PO) Take 1 tablet by mouth daily.    [provider]  omeprazole (PRILOSEC) 20 MG capsule Take 20 mg by mouth daily as needed (acid reflux).     [provider]  pregabalin (LYRICA) 200 MG capsule Take 1 capsule (200 mg total) by mouth 2 (two) times daily. Patient not taking: Reported on 10/26/2016 10/23/16   Myrlene Brokerrawford, Elizabeth A, MD  promethazine (PHENERGAN) 12.5 MG tablet Take 1 tablet (12.5 mg total) by mouth every 6 (six) hours as needed for nausea or vomiting. 01/16/21   Charlynne PanderYao, David Hsienta, MD  sucralfate (CARAFATE) 1 g tablet Take 1 tablet (1 g total) by mouth 4 (four) times daily -  with meals and at bedtime. 01/16/21   Charlynne PanderYao, David Hsienta, MD    Allergies    Latex  Review of Systems   Review of Systems  Constitutional:  Negative for fatigue and fever.  HENT:  Negative for congestion and sore throat.   Respiratory:  Negative for cough and shortness of breath.   Cardiovascular:  Negative for chest pain.  Gastrointestinal:  Positive for abdominal pain, nausea and vomiting. Negative for constipation and diarrhea.  Genitourinary:  Negative for decreased urine volume, dysuria and hematuria.  Musculoskeletal:  Negative for arthralgias and back pain.  Neurological:  Negative for dizziness and weakness.   Physical Exam Updated Vital Signs BP (!) 156/90 (BP Location: Right Arm)   Pulse 79   Temp 97.7 F (36.5 C) (Oral)   Resp 18   Wt 56.2 kg   SpO2 100%   BMI 21.28 kg/m   Physical Exam Constitutional:      Appearance: Normal appearance.  She is ill-appearing.  HENT:     Head: Normocephalic and atraumatic.  Eyes:     General: No scleral icterus. Cardiovascular:     Rate and Rhythm: Normal rate and regular rhythm.  Pulmonary:     Effort: Pulmonary effort is normal.     Breath sounds: Normal breath sounds.  Abdominal:     General: Abdomen is flat. Bowel sounds are normal.  Palpations: Abdomen is soft.     Tenderness: There is abdominal tenderness in the right upper quadrant and epigastric area. There is no right CVA tenderness, left CVA tenderness, guarding or rebound. Negative signs include Murphy's sign, Rovsing's sign and McBurney's sign.  Musculoskeletal:        General: No deformity.     Cervical back: Normal range of motion.  Skin:    General: Skin is warm and dry.  Neurological:     General: No focal deficit present.     Mental Status: She is alert. Mental status is at baseline.    ED Results / Procedures / Treatments   Labs (all labs ordered are listed, but only abnormal results are displayed) Labs Reviewed  CBC WITH DIFFERENTIAL/PLATELET - Abnormal; Notable for the following components:      Result Value   WBC 13.4 (*)    Hemoglobin 15.5 (*)    Neutro Abs 11.3 (*)    Abs Immature Granulocytes 0.08 (*)    All other components within normal limits  COMPREHENSIVE METABOLIC PANEL - Abnormal; Notable for the following components:   Chloride 97 (*)    Glucose, Bld 144 (*)    Total Protein 8.2 (*)    All other components within normal limits  LIPASE, BLOOD    EKG None  Radiology US Abdomen Limited RUQ (LIVER/GB)  Result Date: 01/21/2021 CLINICAL DATA:  Elevated liver enzyme EXAM: ULTRASOUND ABDOMEN LIMITED RIGHT UPPER QUADRANT COMPARISON:  CT 01/16/2021 FINDINGS: Gallbladder: No gallstones or wall thickening visualized. No sonographic Murphy sign noted by sonographer. Common bile duct: Diameter: 2.4 mm Liver: Diffusely echogenic. Septated cyst in the left hepatic lobe measuring 17 mm portal vein is  patent on color Doppler imaging with normal direction of blood flow towards the liver. Other: None. IMPRESSION: 1. Negative for gallstones. 2. Echogenic liver consistent with hepatic steatosis Electronically Signed   By: Jasmine Pang M.D.   On: 01/21/2021 19:49    Procedures Procedures   Medications Ordered in ED Medications  lactated ringers bolus 1,000 mL (0 mLs Intravenous Stopped 01/21/21 1930)  ondansetron (ZOFRAN) injection 4 mg (4 mg Intravenous Given 01/21/21 1824)  metoCLOPramide (REGLAN) injection 10 mg (10 mg Intravenous Given 01/21/21 1907)  diphenhydrAMINE (BENADRYL) injection 25 mg (25 mg Intravenous Given 01/21/21 1904)    ED Course  I have reviewed the triage vital signs and the nursing notes.  Pertinent labs & imaging results that were available during my care of the patient were reviewed by me and considered in my medical decision making (see chart for details).  Patricia Hart is a 60 y/o F with h/o DVT not on anticoagulation and chronic abdominal pain presents to the ED for persistent epigastric pain and nausea/vomiting for the past two weeks. She was last seen here on 01/16/2021 where she had a benign A/P CT and was given Sucralfate.   After re-evaluation patient reports her nausea is not improving with the Zofran. Reglan and Benadryl ordered.  Patient is now laying calmly in bed and states she is feeling much better after the Reglan and Benadryl.  Imaging showed hepatic steatosis, but no gallbladder pathology visualized. This is most likely her chronic abdominal pain she has been experiencing for years. At this the patient does not appear to have a life threatening emergency requiring further evaluation in the ED. Recommended she find other GI providers in her area to follow-up with. Reglan will be given for at home nausea.     MDM Rules/Calculators/A&P  Final Clinical Impression(s) / ED Diagnoses Final diagnoses:  Epigastric pain  Nausea and vomiting, intractability  of vomiting not specified, unspecified vomiting type    Rx / DC Orders ED Discharge Orders          Ordered    metoCLOPramide (REGLAN) 10 MG tablet  Every 6 hours PRN        01/21/21 2003             Achille Rich, [place] 01/21/21 2007    Terrilee Files, MD 01/22/21 1141

## 2021-01-21 NOTE — ED Provider Notes (Signed)
Pt seen in conjunction with R Ransom, PA-C. Please see her notes for full history, exam, and plan.   In brief, patient presented for evaluation nausea, vomiting, epigastric pain.  She has a history of intermittent symptoms, was here a week ago for the same.  She reports worsened/persistent nausea, vomiting, epigastric pain.  She is been taking Carafate and omeprazole without improvement symptoms.  She has tried Phenergan today without improvement of symptoms.  She had a GI doctor, but is trying to establish with a new one and has been unable to do so quickly. On exam, patient with ttp of epigastric and ruq abd. Will plan on labs, [place], and symptomatic tx.      Alveria Apley, PA-C 01/21/21 2047    Terrilee Files, MD 01/22/21 1141

## 2021-01-21 NOTE — Discharge Instructions (Addendum)
You were prescribed Reglan for your nausea and vomiting symptoms. Please take every 6 hours as needed for vomiting.   Follow up with your primary care provider for re-evaluation of abdominal pain, nausea, and vomiting.  Follow up with GI provider is warranted given chronicity of symptoms.

## 2021-01-29 ENCOUNTER — Encounter: Payer: Self-pay | Admitting: Gastroenterology

## 2021-01-29 ENCOUNTER — Other Ambulatory Visit (INDEPENDENT_AMBULATORY_CARE_PROVIDER_SITE_OTHER): Payer: BC Managed Care – PPO

## 2021-01-29 ENCOUNTER — Ambulatory Visit (INDEPENDENT_AMBULATORY_CARE_PROVIDER_SITE_OTHER): Payer: BC Managed Care – PPO | Admitting: Gastroenterology

## 2021-01-29 VITALS — BP 110/70 | HR 92 | Ht 63.0 in | Wt 128.4 lb

## 2021-01-29 DIAGNOSIS — K920 Hematemesis: Secondary | ICD-10-CM

## 2021-01-29 DIAGNOSIS — R112 Nausea with vomiting, unspecified: Secondary | ICD-10-CM

## 2021-01-29 DIAGNOSIS — Z800 Family history of malignant neoplasm of digestive organs: Secondary | ICD-10-CM

## 2021-01-29 DIAGNOSIS — R1310 Dysphagia, unspecified: Secondary | ICD-10-CM | POA: Diagnosis not present

## 2021-01-29 DIAGNOSIS — R1110 Vomiting, unspecified: Secondary | ICD-10-CM | POA: Insufficient documentation

## 2021-01-29 DIAGNOSIS — K2960 Other gastritis without bleeding: Secondary | ICD-10-CM

## 2021-01-29 DIAGNOSIS — Z83719 Family history of colon polyps, unspecified: Secondary | ICD-10-CM | POA: Insufficient documentation

## 2021-01-29 DIAGNOSIS — B3781 Candidal esophagitis: Secondary | ICD-10-CM

## 2021-01-29 DIAGNOSIS — R1013 Epigastric pain: Secondary | ICD-10-CM | POA: Diagnosis not present

## 2021-01-29 DIAGNOSIS — Z8371 Family history of colonic polyps: Secondary | ICD-10-CM

## 2021-01-29 DIAGNOSIS — K2970 Gastritis, unspecified, without bleeding: Secondary | ICD-10-CM | POA: Insufficient documentation

## 2021-01-29 DIAGNOSIS — G8929 Other chronic pain: Secondary | ICD-10-CM

## 2021-01-29 LAB — CBC
HCT: 39.7 % (ref 36.0–46.0)
Hemoglobin: 13.4 g/dL (ref 12.0–15.0)
MCHC: 33.7 g/dL (ref 30.0–36.0)
MCV: 94 fl (ref 78.0–100.0)
Platelets: 283 10*3/uL (ref 150.0–400.0)
RBC: 4.23 Mil/uL (ref 3.87–5.11)
RDW: 14.6 % (ref 11.5–15.5)
WBC: 8.9 10*3/uL (ref 4.0–10.5)

## 2021-01-29 LAB — C-REACTIVE PROTEIN: CRP: <1 mg/dL (ref 0.5–20.0)

## 2021-01-29 LAB — TSH: TSH: 1.4 u[IU]/mL (ref 0.35–5.50)

## 2021-01-29 LAB — CORTISOL: Cortisol, Plasma: 7.2 ug/dL

## 2021-01-29 LAB — SEDIMENTATION RATE: Sed Rate: 18 mm/hr (ref 0–30)

## 2021-01-29 NOTE — Patient Instructions (Signed)
Your provider has requested that you go to the basement level for lab work before leaving today. Press "B" on the elevator. The lab is located at the first door on the left as you exit the elevator.  You have been scheduled for an endoscopy. Please follow written instructions given to you at your visit today. If you use inhalers (even only as needed), please bring them with you on the day of your procedure.   You will be contacted by [facility] Scheduling in the next 2 days to arrange a GES.  The number on your caller ID will be [phone], please answer when they call.  If you have not heard from them in 2 days please call [phone] to schedule.    If you are age 11 or older, your body mass index should be between 23-30. Your Body mass index is 22.75 kg/m. If this is out of the aforementioned range listed, please consider follow up with your Primary Care Provider.  If you are age 46 or younger, your body mass index should be between 19-25. Your Body mass index is 22.75 kg/m. If this is out of the aformentioned range listed, please consider follow up with your Primary Care Provider.   __________________________________________________________  The [place] [facility] providers would like to encourage you to use [facility] to communicate with providers for non-urgent requests or questions.  Due to long hold times on the telephone, sending your provider a message by [facility] may be a faster and more efficient way to get a response.  Please allow 48 business hours for a response.  Please remember that this is for non-urgent requests.   Thank you for choosing me and [facility].  Dr. Meridee Score

## 2021-01-29 NOTE — Progress Notes (Signed)
GASTROENTEROLOGY OUTPATIENT CLINIC VISIT   Primary Care Provider Irena Reichmann, DO [address] [place] [place] [address] [phone]  Referring Provider Irena Reichmann, DO [address] [place],  [place] [address] [phone]  Patient Profile: Patricia Hart is a 60 y.o. 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  The patient presents to the [facility] for an evaluation and management of problem(s) noted below:  Problem List 1. Abdominal pain, epigastric   2. Dysphagia, unspecified type   3. Intractable vomiting with nausea, unspecified vomiting type   4. Abdominal pain, chronic, epigastric   5. Hematemesis with nausea   6. Candida esophagitis (HCC)   7. Reported Candida gastritis without hemorrhage, unspecified chronicity   8. Family history of colon cancer in mother   51. Family history of colonic polyps     History of Present Illness This is the patient's first visit to the outpatient [facility] clinic.  She describes a previous history of following with [facility] gastroenterology, Dr. Levora Angel and Dr. Madilyn Fireman.  Her mother has a history of previous colon cancer as well as an advanced adenoma but she has not undergone a 5-year follow-up.  The patient reports a prior colonoscopy at least 8 years ago for which she had multiple issues post procedure and is not sure she will ever want to have another colonoscopy.  The patient describes years of acid related issues.  She states she has had GERD symptoms as well as dyspeptic-like symptoms.  She has had issues of nausea and vomiting that come and go sometimes for weeks at a time.  For the last year she has been experiencing abdominal pain in the mid  epigastric/subxiphoid region.  This pain is constant in nature and present for the majority of the day.  In the last few weeks she has been having significant issues with nausea and vomiting and been unable to maintain a normal diet.  She tries to minimize complex sugars and also minimizes gluten (she states she has previously been evaluated for celiac disease).  In April of this year, the patient reports having an upper endoscopy performed at which point she was found to have Candida esophagitis and Candida gastritis for which she was treated with fluconazole.  She never was able to get in touch with [facility] after this endoscopy in April.  She did not feel there was a significant benefit after treatment with fluconazole.  She however was told she needed to have an repeat endoscopy which was scheduled for July but she canceled that because she did not have a sense that she was being heard at her current GI office.  In the past she has trialed Nexium as well as Protonix.  Patient has had episodes of hematemesis, though it is usually 1 to 2 days after the initiation of her nausea and vomiting and she believes this is a result of significant retching that it is occurring.  She has had some slight weight loss over the course the last few weeks but that has overall stabilized.  In the last 6 months her weight has been stable.  She has normal bowel habits and denies any blood in her stools.  Unfortunately, we do not have any access to any of her records from [facility] other than what is in the system and we  will work on trying to obtain those.  The patient has trialed Carafate in the past with some improvement and she has continued that until her prescriptions have ended.  She currently does not have any further Carafate and the way that it makes her feel she wants to try to minimize use if possible.  She has been experiencing episodes of pill and food dysphagia over the course of the last month when previously she did  not have this symptom frequently.  She describes some minimal odynophagia.  At a recent ED visit she was prescribed metoclopramide as well as Phenergan but she has not used them.  GI Review of Systems Positive as above including decreased appetite Negative for bloating, diarrhea, alteration of bowel habits, melena, hematochezia, fecal incontinence  Review of Systems General: Denies fevers/chills HEENT: Denies oral lesions Cardiovascular: Denies chest pain/palpitations Pulmonary: Denies shortness of breath/nocturnal cough Gastroenterological: See HPI Genitourinary: Denies darkened urine Hematological: Denies easy bruising/bleeding Endocrine: Denies temperature intolerance Dermatological: Denies jaundice Psychological: Mood is anxious to get better   Medications Current Outpatient Medications  Medication Sig Dispense Refill   acetaminophen (TYLENOL) 500 MG tablet Take 1,500 mg by mouth every 6 (six) hours as needed for mild pain.     Cyanocobalamin (VITAMIN B-12 PO) Take 1 tablet by mouth daily.     metoCLOPramide (REGLAN) 10 MG tablet Take 1 tablet (10 mg total) by mouth every 6 (six) hours as needed for nausea or vomiting. 15 tablet 0   Multiple Vitamins-Minerals (MULTIVITAMIN PO) Take 1 tablet by mouth daily.     pantoprazole (PROTONIX) 20 MG tablet Take 20 mg by mouth 2 (two) times daily.     promethazine (PHENERGAN) 12.5 MG tablet Take 1 tablet (12.5 mg total) by mouth every 6 (six) hours as needed for nausea or vomiting. 10 tablet 0   sucralfate (CARAFATE) 1 g tablet Take 1 tablet (1 g total) by mouth 4 (four) times daily -  with meals and at bedtime. 30 tablet 0   No current facility-administered medications for this visit.    Allergies Allergies  Allergen Reactions   Latex Other (See Comments)    Skin irritation    Histories Past Medical History:  Diagnosis Date   Arthritis    Blood transfusion without reported diagnosis 1997   Clotting disorder (HCC) 1997    pregnancy induced dvt- no problems since then   Fibromyalgia    GERD (gastroesophageal reflux disease)    History of DVT (deep vein thrombosis)    during pregnancy   History of pulmonary embolism    occurred during pregnancy   Overactive bladder    Past Surgical History:  Procedure Laterality Date   ABDOMINAL EXPOSURE N/A 02/19/2016   Procedure: ABDOMINAL EXPOSURE;  Surgeon: Larina Earthly, MD;  Location: [facility];  Service: Vascular;  Laterality: N/A;   ABLATION  2006   endometrial   ANTERIOR LUMBAR FUSION N/A 02/19/2016   Procedure: Anterior Lumbar Interbody Fusion  - Lumbar five-sacral one;  Surgeon: Tia Alert, MD;  Location: [facility];  Service: Neurosurgery;  Laterality: N/A;   BACK SURGERY     breast implants     saline   CARPAL TUNNEL RELEASE Right 04/23/2015   Procedure: RIGHT CARPAL TUNNEL RELEASE;  Surgeon: Cindee Salt, MD;  Location: [facility];  Service: Orthopedics;  Laterality: Right;   CARPAL TUNNEL RELEASE Left 05/16/2015   Procedure: LEFT CARPAL TUNNEL RELEASE;  Surgeon: Cindee Salt, MD;  Location: [facility];  Service: Orthopedics;  Laterality: Left;   TONSILLECTOMY     TRIGGER FINGER RELEASE Right 04/23/2015   Procedure: RELEASE TRIGGER FINGER/A-1 PULLEY RIGHT THUMB;  Surgeon: Cindee SaltGary Kuzma, MD;  Location: [facility];  Service: Orthopedics;  Laterality: Right;   WISDOM TOOTH EXTRACTION     Social History   Socioeconomic History   Marital status: Divorced    Spouse name: Not on file   Number of children: Not on file   Years of education: Not on file   Highest education level: Not on file  Occupational History   Not on file  Tobacco Use   Smoking status: Never   Smokeless tobacco: Never  Vaping Use   Vaping Use: Never used  Substance and Sexual Activity   Alcohol use: Yes    Alcohol/week: 6.0 standard drinks    Types: 4 Glasses of wine, 2 Standard drinks or equivalent per week    Comment: occ (currently  none due to stomach issues 01/29/21)   Drug use: No   Sexual activity: Not Currently    Partners: Male    Birth control/protection: Surgical    Comment: Ablation  Other Topics Concern   Not on file  Social History Narrative   Not on file   Social Determinants of Health   Financial Resource Strain: Not on file  Food Insecurity: Not on file  Transportation Needs: Not on file  Physical Activity: Not on file  Stress: Not on file  Social Connections: Not on file  Intimate Partner Violence: Not on file   Family History  Problem Relation Age of Onset   Colon polyps Mother    Hyperlipidemia Mother    Colon cancer Mother        in her 7530s or 6940s   Osteoporosis Mother    Lung cancer Mother    Diabetes Father    Hyperlipidemia Father    Hypertension Father    Breast cancer Maternal Aunt        9050's   Pancreatic cancer Neg Hx    Stomach cancer Neg Hx    Liver disease Neg Hx    Inflammatory bowel disease Neg Hx    Esophageal cancer Neg Hx    I have reviewed her medical, social, and family history in detail and updated the electronic medical record as necessary.    PHYSICAL EXAMINATION  BP 110/70   Pulse 92   Ht 5\' 3"  (1.6 m)   Wt 128 lb 6.4 oz (58.2 kg)   BMI 22.75 kg/m  Wt Readings from Last 3 Encounters:  01/29/21 128 lb 6.4 oz (58.2 kg)  01/21/21 124 lb (56.2 kg)  01/16/21 123 lb (55.8 kg)  GEN: NAD, appears stated age, doesn't appear chronically ill PSYCH: Cooperative, without pressured speech EYE: Conjunctivae pink, sclerae anicteric ENT: MMM, without oral ulcers CV: RR without R/Gs  RESP: CTAB posteriorly, without wheezing GI: NABS, soft, minimal tenderness to palpation and some xiphoid/midepigastric region, ND, without rebound, mild volitional guarding present, no HSM appreciated MSK/EXT: No lower extremity edema SKIN: No jaundice NEURO:  Alert & Oriented x 3, no focal deficits   REVIEW OF DATA  I reviewed the following data at the time of this  encounter:  GI Procedures and Studies  Patient reports a colonoscopy at least 8 years ago at the age of 60 though she has not had a repeat colonoscopy due to issues that she sustained after her colonoscopy we will need to try and get these  records  2016 upper endoscopy by Dr. Alwyn Pea hiatal hernia Grade C esophagitis Diffuse moderate inflammation characterized by edema, erosions, erythema, granularity in the gastric body and antrum for which biopsies were obtained. First part of the duodenum was normal.  Laboratory Studies  Reviewed those in epic and care everywhere  Imaging Studies  August 2022 CTAP IMPRESSION: No acute intra-abdominal pathology identified. No definite radiographic explanation for the patient's reported symptoms. Aortic Atherosclerosis (ICD10-I70.0).  August 2022 RUQ US IMPRESSION: 1. Negative for gallstones. 2. Echogenic liver consistent with hepatic steatosis   ASSESSMENT  Ms. Lighty is a 60 y.o. 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  The patient is seen today for evaluation and management of:  1. Abdominal pain, epigastric   2. Dysphagia, unspecified type   3. Intractable vomiting with nausea, unspecified vomiting type   4. Abdominal pain, chronic, epigastric   5. Hematemesis with nausea   6. Candida esophagitis (HCC)   7. Reported Candida gastritis without hemorrhage, unspecified chronicity   8. Family history of colon cancer in mother   43. Family history of colonic polyps    The patient is hemodynamically stable.  Clinically however she has significant symptoms that have caused her to go to the emergency department within the last month few times.  Etiology remains unclear.  She reports a previous history of  Candida esophagitis and reported Candida gastritis.  She has been treated with fluconazole.  She never had follow-up with [facility] even though she tried to reach out to them unfortunately.  Previous PPI trial seem to not have been effective overall and she has had some episodes of hematemesis although these likely a result of retching.  She has had abdominal imaging including both an ultrasound and CT done at the emergency department.  The chronicity of her symptoms make me feel that the likely diagnosis will end up being a functional disorder however with the hematemesis and the recent worsening of symptoms further diagnostic work-up is required.  Laboratories will be performed today and we will move forward with a diagnostic endoscopy to further define her symptoms as much as possible and ensure she does not have a persistence of the candidal infections.  We will try to get the records from [facility] as well.  The patient's mother has a family history of colon cancer as well as an advanced adenoma and she would be due for colon cancer screening as it is likely been 8 years since her last colonoscopy, however with her current symptoms as well as the patient not really being interested in repeat colonoscopy, we will not pursue this evaluation for now but consider it in the future and reminder in the future that she would not be a Cologuard candidate due to her increased risk overall.  Time will tell if we end up deciding to do that.  A gastric emptying study will be pursued as well while hopefully not taking any antiemetics.  The previously prescribed Reglan and Phenergan were discussed with the patient and I have asked her to try not to use the Reglan on a regular basis due to the black box warning but we will see how she does with Phenergan if needed.  The risks and benefits of endoscopic evaluation were discussed with the patient; these include but are not limited to the risk  of perforation, infection, bleeding,  missed lesions, lack of diagnosis, severe illness requiring hospitalization, as well as anesthesia and sedation related illnesses.  The patient and/or family is agreeable to proceed.  All patient questions were answered to the best of my ability, and the patient agrees to the aforementioned plan of action with follow-up as indicated.   PLAN  Laboratories as outlined below Proceed with scheduling gastric emptying study Proceed with scheduling diagnostic endoscopy and dilation Can continue Carafate for now twice daily Continue PPI 20 mg twice daily for now Obtain previous EGD and colonoscopy through [facility] Colonoscopy recommendation of a 5-year follow-up should be had with the patient when she is ready to consider this due to family history Further recommendations after EGD is completed   Orders Placed This Encounter  Procedures   NM Gastric Emptying   CBC   TSH   Cortisol   Sedimentation rate   C-reactive protein   Tissue transglutaminase, IgA   IgA   Ambulatory referral to Gastroenterology    New Prescriptions   No medications on file   Modified Medications   No medications on file    Planned Follow Up No follow-ups on file.   Total Time in Face-to-Face and in Coordination of Care for patient including independent/personal interpretation/review of prior testing, medical history, examination, medication adjustment, communicating results with the patient directly, and documentation with the EHR is 45 minutes.   Corliss Parish, MD [facility] Advanced Endoscopy Office # [phone]

## 2021-01-30 ENCOUNTER — Encounter: Payer: Self-pay | Admitting: Gastroenterology

## 2021-01-30 ENCOUNTER — Ambulatory Visit (AMBULATORY_SURGERY_CENTER): Payer: BC Managed Care – PPO | Admitting: Gastroenterology

## 2021-01-30 VITALS — BP 117/57 | HR 80 | Temp 98.4°F | Resp 16 | Ht 63.0 in | Wt 128.0 lb

## 2021-01-30 DIAGNOSIS — K222 Esophageal obstruction: Secondary | ICD-10-CM | POA: Diagnosis not present

## 2021-01-30 DIAGNOSIS — K449 Diaphragmatic hernia without obstruction or gangrene: Secondary | ICD-10-CM | POA: Diagnosis not present

## 2021-01-30 DIAGNOSIS — R1310 Dysphagia, unspecified: Secondary | ICD-10-CM | POA: Diagnosis not present

## 2021-01-30 DIAGNOSIS — R1013 Epigastric pain: Secondary | ICD-10-CM

## 2021-01-30 DIAGNOSIS — R12 Heartburn: Secondary | ICD-10-CM

## 2021-01-30 LAB — TISSUE TRANSGLUTAMINASE, IGA: (tTG) Ab, IgA: <1 U/mL

## 2021-01-30 LAB — IGA: Immunoglobulin A: 157 mg/dL (ref 47–310)

## 2021-01-30 MED ORDER — SODIUM CHLORIDE 0.9 % IV SOLN: 500.0000 mL | Freq: Once | INTRAVENOUS | Status: DC

## 2021-01-30 MED ORDER — PANTOPRAZOLE SODIUM 40 MG PO TBEC: 40.0000 mg | DELAYED_RELEASE_TABLET | Freq: Two times a day (BID) | ORAL | 3 refills | Status: DC

## 2021-01-30 NOTE — Progress Notes (Signed)
Report given to PACU, vss 

## 2021-01-30 NOTE — Op Note (Signed)
[facility] Patient Name: Patricia Hart Procedure Date: 01/30/2021 3:27 PM MRN: [id number] Endoscopist:  Mansouraty , MD Age: 59 Referring MD:  Date of Birth: 02/21/1961 Gender: Female Account #: [id number] Procedure:                Upper GI endoscopy Indications:              Upper abdominal pain, Indigestion, Dysphagia,                            Abdominal bloating, Early satiety, Nausea with                            vomiting, Persistent vomiting of unknown cause,                            History of reported Candida Esophagitis & Candida                            Gastritis Medicines:                Monitored Anesthesia Care Procedure:                Pre-Anesthesia Assessment:                           - Prior to the procedure, a History and Physical                            was performed, and patient medications and                            allergies were reviewed. The patient's tolerance of                            previous anesthesia was also reviewed. The risks                            and benefits of the procedure and the sedation                            options and risks were discussed with the patient.                            All questions were answered, and informed consent                            was obtained. Prior Anticoagulants: The patient has                            taken no previous anticoagulant or antiplatelet                            agents. ASA Grade Assessment: II - A patient with                              mild systemic disease. After reviewing the risks                            and benefits, the patient was deemed in                            satisfactory condition to undergo the procedure.                           After obtaining informed consent, the endoscope was                            passed under direct vision. Throughout the                            procedure, the patient's blood pressure, pulse, and                             oxygen saturations were monitored continuously. The                            GIF [id number] #[id number] was introduced through the                            mouth, and advanced to the duodenal bulb. The upper                            GI endoscopy was accomplished without difficulty.                            The patient tolerated the procedure. Scope In: Scope Out: Findings:                 No gross lesions were noted in the entire                            esophagus. Biopsies were taken with a cold forceps                            for histology. After the rest of the EGD was                            complete, a guidewire was placed and the scope was                            withdrawn. Dilation was performed with a Savary                            dilator with no resistance at 18 mm. The dilation                            site was examined following endoscope reinsertion                              and showed mild mucosal disruption and mild                            improvement in luminal narrowing just below the UES.                           The Z-line was regular and was found 36 cm from the                            incisors.                           A 2 cm hiatal hernia was present.                           Patchy mildly erythematous mucosa without bleeding                            was found in the entire examined stomach. Biopsies                            were taken with a cold forceps for histology and                            Helicobacter pylori testing.                           No gross lesions were noted in the duodenal bulb.                           An acquired benign-appearing, intrinsic severe                            stenosis was found in the D1/D2 sweep and was                            non-traversed. A TTS dilator was passed through the                            scope but the entire length of the stricture could                             not be seen. Dilation with an 8.5-9.5-10.5 mm                            pyloric balloon dilator was performed. The dilation                            site was examined following endoscope reinsertion                            and showed mild mucosal disruption and mild  improvement in luminal narrowing but still did not                            allow the scope to pass/traverse. Complications:            No immediate complications. Estimated Blood Loss:     Estimated blood loss was minimal. Impression:               - No gross lesions in esophagus. Biopsied. Dilated.                           - Z-line regular, 36 cm from the incisors.                           - 2 cm hiatal hernia.                           - Erythematous mucosa in the stomach. Biopsied.                           - No gross lesions in the duodenal bulb.                           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 Dilated                            with mucosal wrent noted. Still unable to traverse                            the stricture. Recommendation:           - The patient will be observed post-procedure,                            until all discharge criteria are met.                           - Discharge patient to home.                           - Patient has a contact number available for                            emergencies. The signs and symptoms of potential                            delayed complications were discussed with the                            patient. Return to normal activities tomorrow.                            Written discharge instructions were provided to the                            patient.                           -  Soft diet and purees for now.                           - Continue present medications.                           - Patient will need an UGI Series to evalaute the                            overall stricture length,  though suspect that it is                            short.                           - Repeat dilation ideal with fluoroscopy in                            hospital-based setting and will be repeated there                            and will perform repeat dilation.                           - May be a candidate in future for AXIOS stenting                            if endoscopic dilations are not enough.                           - The findings and recommendations were discussed                            with the patient.  Mansouraty, MD 01/30/2021 4:27:33 PM 

## 2021-01-30 NOTE — Progress Notes (Signed)
Called to room to assist during endoscopic procedure.  Patient ID and intended procedure confirmed with present staff. Received instructions for my participation in the procedure from the performing physician.  

## 2021-01-30 NOTE — Patient Instructions (Signed)
Start soft diet and purees for now Continue current medications Await pathology results Will need an upper GI series to evaluate overall stricture  YOU HAD AN ENDOSCOPIC PROCEDURE TODAY AT THE [facility]:   Refer to the procedure report that was given to you for any specific questions about what was found during the examination.  If the procedure report does not answer your questions, please call your gastroenterologist to clarify.  If you requested that your care partner not be given the details of your procedure findings, then the procedure report has been included in a sealed envelope for you to review at your convenience later.  YOU SHOULD EXPECT: Some feelings of bloating in the abdomen. Passage of more gas than usual.  Walking can help get rid of the air that was put into your GI tract during the procedure and reduce the bloating. If you had a lower endoscopy (such as a colonoscopy or flexible sigmoidoscopy) you may notice spotting of blood in your stool or on the toilet paper. If you underwent a bowel prep for your procedure, you may not have a normal bowel movement for a few days.  Please Note:  You might notice some irritation and congestion in your nose or some drainage.  This is from the oxygen used during your procedure.  There is no need for concern and it should clear up in a day or so.  SYMPTOMS TO REPORT IMMEDIATELY:  Following upper endoscopy (EGD)  Vomiting of blood or coffee ground material  New chest pain or pain under the shoulder blades  Painful or persistently difficult swallowing  New shortness of breath  Fever of 100F or higher  Black, tarry-looking stools  For urgent or emergent issues, a gastroenterologist can be reached at any hour by calling [phone]. Do not use MyChart messaging for urgent concerns.   DIET:  We do recommed a small meal at first, but then you may proceed to your regular diet.  Drink plenty of fluids but you should avoid  alcoholic beverages for 24 hours.  ACTIVITY:  You should plan to take it easy for the rest of today and you should NOT DRIVE or use heavy machinery until tomorrow (because of the sedation medicines used during the test).    FOLLOW UP: Our staff will call the number listed on your records 48-72 hours following your procedure to check on you and address any questions or concerns that you may have regarding the information given to you following your procedure. If we do not reach you, we will leave a message.  We will attempt to reach you two times.  During this call, we will ask if you have developed any symptoms of COVID 19. If you develop any symptoms (ie: fever, flu-like symptoms, shortness of breath, cough etc.) before then, please call [phone].  If you test positive for Covid 19 in the 2 weeks post procedure, please call and report this information to [place].    If any biopsies were taken you will be contacted by phone or by letter within the next 1-3 weeks.  Please call us at [phone] if you have not heard about the biopsies in 3 weeks.   SIGNATURES/CONFIDENTIALITY: You and/or your care partner have signed paperwork which will be entered into your electronic medical record.  These signatures attest to the fact that that the information above on your After Visit Summary has been reviewed and is understood.  Full responsibility of the confidentiality of this discharge  information lies with you and/or your care-partner.

## 2021-01-30 NOTE — Progress Notes (Signed)
Vitals-DC  History reviewed.

## 2021-01-30 NOTE — Progress Notes (Signed)
GASTROENTEROLOGY PROCEDURE H&P NOTE   Primary Care Physician: Irena Reichmann, DO  HPI: Patricia Hart is a 60 y.o. female who presents for EGD for evaluation of abdominal pain, dysphagia, nausea and vomiting, history of Candida esophagitis, Candida gastritis (per patient report) with persistent symptoms.  Past Medical History:  Diagnosis Date   Arthritis    Blood transfusion without reported diagnosis 1997   Clotting disorder (HCC) 1997   pregnancy induced dvt- no problems since then   Fibromyalgia    GERD (gastroesophageal reflux disease)    History of DVT (deep vein thrombosis)    during pregnancy   History of pulmonary embolism    occurred during pregnancy   Overactive bladder    Past Surgical History:  Procedure Laterality Date   ABDOMINAL EXPOSURE N/A 02/19/2016   Procedure: ABDOMINAL EXPOSURE;  Surgeon: Larina Earthly, MD;  Location: [facility];  Service: Vascular;  Laterality: N/A;   ABLATION  2006   endometrial   ANTERIOR LUMBAR FUSION N/A 02/19/2016   Procedure: Anterior Lumbar Interbody Fusion  - Lumbar five-sacral one;  Surgeon: Tia Alert, MD;  Location: [facility];  Service: Neurosurgery;  Laterality: N/A;   BACK SURGERY     breast implants     saline   CARPAL TUNNEL RELEASE Right 04/23/2015   Procedure: RIGHT CARPAL TUNNEL RELEASE;  Surgeon: Cindee Salt, MD;  Location: [facility];  Service: Orthopedics;  Laterality: Right;   CARPAL TUNNEL RELEASE Left 05/16/2015   Procedure: LEFT CARPAL TUNNEL RELEASE;  Surgeon: Cindee Salt, MD;  Location: [facility];  Service: Orthopedics;  Laterality: Left;   TONSILLECTOMY     TRIGGER FINGER RELEASE Right 04/23/2015   Procedure: RELEASE TRIGGER FINGER/A-1 PULLEY RIGHT THUMB;  Surgeon: Cindee Salt, MD;  Location: [facility];  Service: Orthopedics;  Laterality: Right;   WISDOM TOOTH EXTRACTION     Current Outpatient Medications  Medication Sig Dispense Refill   acetaminophen  (TYLENOL) 500 MG tablet Take 1,500 mg by mouth every 6 (six) hours as needed for mild pain.     Cyanocobalamin (VITAMIN B-12 PO) Take 1 tablet by mouth daily.     Multiple Vitamins-Minerals (MULTIVITAMIN PO) Take 1 tablet by mouth daily.     pantoprazole (PROTONIX) 20 MG tablet Take 20 mg by mouth 2 (two) times daily.     metoCLOPramide (REGLAN) 10 MG tablet Take 1 tablet (10 mg total) by mouth every 6 (six) hours as needed for nausea or vomiting. 15 tablet 0   promethazine (PHENERGAN) 12.5 MG tablet Take 1 tablet (12.5 mg total) by mouth every 6 (six) hours as needed for nausea or vomiting. 10 tablet 0   sucralfate (CARAFATE) 1 g tablet Take 1 tablet (1 g total) by mouth 4 (four) times daily -  with meals and at bedtime. 30 tablet 0   Current Facility-Administered Medications  Medication Dose Route Frequency Provider Last Rate Last Admin   0.9 %  sodium chloride infusion  500 mL Intravenous Once Mansouraty, Netty Starring., MD        Current Outpatient Medications:    acetaminophen (TYLENOL) 500 MG tablet, Take 1,500 mg by mouth every 6 (six) hours as needed for mild pain., Disp: , Rfl:    Cyanocobalamin (VITAMIN B-12 PO), Take 1 tablet by mouth daily., Disp: , Rfl:    Multiple Vitamins-Minerals (MULTIVITAMIN PO), Take 1 tablet by mouth daily., Disp: , Rfl:    pantoprazole (PROTONIX) 20 MG tablet, Take 20 mg  by mouth 2 (two) times daily., Disp: , Rfl:    metoCLOPramide (REGLAN) 10 MG tablet, Take 1 tablet (10 mg total) by mouth every 6 (six) hours as needed for nausea or vomiting., Disp: 15 tablet, Rfl: 0   promethazine (PHENERGAN) 12.5 MG tablet, Take 1 tablet (12.5 mg total) by mouth every 6 (six) hours as needed for nausea or vomiting., Disp: 10 tablet, Rfl: 0   sucralfate (CARAFATE) 1 g tablet, Take 1 tablet (1 g total) by mouth 4 (four) times daily -  with meals and at bedtime., Disp: 30 tablet, Rfl: 0  Current Facility-Administered Medications:    0.9 %  sodium chloride infusion, 500 mL,  Intravenous, Once, Mansouraty, Netty Starring., MD Allergies  Allergen Reactions   Latex Other (See Comments)    Skin irritation   Family History  Problem Relation Age of Onset   Colon polyps Mother    Hyperlipidemia Mother    Colon cancer Mother        in her 81s or 48s   Osteoporosis Mother    Lung cancer Mother    Diabetes Father    Hyperlipidemia Father    Hypertension Father    Breast cancer Maternal Aunt        35's   Pancreatic cancer Neg Hx    Stomach cancer Neg Hx    Liver disease Neg Hx    Inflammatory bowel disease Neg Hx    Esophageal cancer Neg Hx    Social History   Socioeconomic History   Marital status: Divorced    Spouse name: Not on file   Number of children: Not on file   Years of education: Not on file   Highest education level: Not on file  Occupational History   Not on file  Tobacco Use   Smoking status: Never   Smokeless tobacco: Never  Vaping Use   Vaping Use: Never used  Substance and Sexual Activity   Alcohol use: Yes    Alcohol/week: 6.0 standard drinks    Types: 4 Glasses of wine, 2 Standard drinks or equivalent per week    Comment: occ (currently none due to stomach issues 01/29/21)   Drug use: No   Sexual activity: Not Currently    Partners: Male    Birth control/protection: Surgical    Comment: Ablation  Other Topics Concern   Not on file  Social History Narrative   Not on file   Social Determinants of Health   Financial Resource Strain: Not on file  Food Insecurity: Not on file  Transportation Needs: Not on file  Physical Activity: Not on file  Stress: Not on file  Social Connections: Not on file  Intimate Partner Violence: Not on file    Physical Exam: Vital signs in last 24 hours: @VSRANGES @   GEN: NAD EYE: Sclerae anicteric ENT: MMM CV: Non-tachycardic GI: Soft, NT/ND NEURO:  Alert & Oriented x 3  Lab Results: Recent Labs    01/29/21 1442  WBC 8.9  HGB 13.4  HCT 39.7  PLT 283.0   BMET No results for  input(s): NA, K, CL, CO2, GLUCOSE, BUN, CREATININE, CALCIUM in the last 72 hours. LFT No results for input(s): PROT, ALBUMIN, AST, ALT, ALKPHOS, BILITOT, BILIDIR, IBILI in the last 72 hours. PT/INR No results for input(s): LABPROT, INR in the last 72 hours.   Impression / Plan: This is a 60 y.o.female who presents for EGD for evaluation of abdominal pain, dysphagia, nausea and vomiting, history of Candida esophagitis, Candida  gastritis (per patient report) with persistent symptoms.  The risks and benefits of endoscopic evaluation/treatment were discussed with the patient and/or family; these include but are not limited to the risk of perforation, infection, bleeding, missed lesions, lack of diagnosis, severe illness requiring hospitalization, as well as anesthesia and sedation related illnesses.  The patient's history has been reviewed, patient examined, no change in status, and deemed stable for procedure.  The patient and/or family is agreeable to proceed.    Corliss Parish, MD [facility] Office # [phone]

## 2021-01-30 NOTE — Progress Notes (Signed)
1538 Robinul 0.1 mg IV given due large amount of secretions upon assessment.  MD made aware, vss 

## 2021-02-03 ENCOUNTER — Telehealth: Payer: Self-pay | Admitting: *Deleted

## 2021-02-03 ENCOUNTER — Telehealth: Payer: Self-pay

## 2021-02-03 NOTE — Telephone Encounter (Signed)
  Follow up Call-  Call back number 01/30/2021  Post procedure Call Back phone  # [phone]  Permission to leave phone message Yes  Some recent data might be hidden     Patient questions:  Do you have a fever, pain , or abdominal swelling? No. Pain Score  0 *  Have you tolerated food without any problems? Yes.    Have you been able to return to your normal activities? Yes.    Do you have any questions about your discharge instructions: Diet   No. Medications  No. Follow up visit  No.  Do you have questions or concerns about your Care? No.  Actions: * If pain score is 4 or above: No action needed, pain <4. Have you developed a fever since your procedure? no  2.   Have you had an respiratory symptoms (SOB or cough) since your procedure? no  3.   Have you tested positive for COVID 19 since your procedure no  4.   Have you had any family members/close contacts diagnosed with the COVID 19 since your procedure?  no   If yes to any of these questions please route to Laverna Peace, RN and Karlton Lemon, RN

## 2021-02-03 NOTE — Telephone Encounter (Signed)
No answer, left message to call back later today, B.Dijon Cosens RN. 

## 2021-02-06 ENCOUNTER — Encounter: Payer: Self-pay | Admitting: Gastroenterology

## 2021-02-07 ENCOUNTER — Other Ambulatory Visit: Payer: Self-pay

## 2021-02-07 DIAGNOSIS — R1310 Dysphagia, unspecified: Secondary | ICD-10-CM

## 2021-02-07 DIAGNOSIS — R112 Nausea with vomiting, unspecified: Secondary | ICD-10-CM

## 2021-02-07 DIAGNOSIS — G8929 Other chronic pain: Secondary | ICD-10-CM

## 2021-02-17 ENCOUNTER — Encounter (HOSPITAL_COMMUNITY)
Admission: RE | Admit: 2021-02-17 | Discharge: 2021-02-17 | Disposition: A | Discharge status: Home / Self Care | Payer: BC Managed Care – PPO | Source: Ambulatory Visit | Attending: Gastroenterology | Admitting: Gastroenterology

## 2021-02-17 ENCOUNTER — Other Ambulatory Visit: Payer: Self-pay

## 2021-02-17 DIAGNOSIS — R1013 Epigastric pain: Secondary | ICD-10-CM | POA: Diagnosis not present

## 2021-02-17 DIAGNOSIS — R140 Abdominal distension (gaseous): Secondary | ICD-10-CM | POA: Diagnosis not present

## 2021-02-17 DIAGNOSIS — K219 Gastro-esophageal reflux disease without esophagitis: Secondary | ICD-10-CM | POA: Diagnosis not present

## 2021-02-17 DIAGNOSIS — R1310 Dysphagia, unspecified: Secondary | ICD-10-CM | POA: Insufficient documentation

## 2021-02-17 DIAGNOSIS — R112 Nausea with vomiting, unspecified: Secondary | ICD-10-CM | POA: Insufficient documentation

## 2021-02-17 DIAGNOSIS — R6881 Early satiety: Secondary | ICD-10-CM | POA: Diagnosis not present

## 2021-02-17 MED ORDER — TECHNETIUM TC 99M SULFUR COLLOID
2.2000 | Freq: Once | INTRAVENOUS | Status: AC
  Administered 2021-02-17: 2.2 via INTRAVENOUS

## 2021-02-18 ENCOUNTER — Ambulatory Visit (HOSPITAL_COMMUNITY)
Admission: RE | Admit: 2021-02-18 | Discharge: 2021-02-18 | Disposition: A | Discharge status: Home / Self Care | Payer: BC Managed Care – PPO | Source: Ambulatory Visit | Attending: Gastroenterology | Admitting: Gastroenterology

## 2021-02-18 ENCOUNTER — Other Ambulatory Visit: Payer: Self-pay

## 2021-02-18 ENCOUNTER — Telehealth: Payer: Self-pay

## 2021-02-18 DIAGNOSIS — G8929 Other chronic pain: Secondary | ICD-10-CM | POA: Diagnosis not present

## 2021-02-18 DIAGNOSIS — B3781 Candidal esophagitis: Secondary | ICD-10-CM

## 2021-02-18 DIAGNOSIS — R1013 Epigastric pain: Secondary | ICD-10-CM | POA: Diagnosis not present

## 2021-02-18 DIAGNOSIS — R1310 Dysphagia, unspecified: Secondary | ICD-10-CM

## 2021-02-18 DIAGNOSIS — R112 Nausea with vomiting, unspecified: Secondary | ICD-10-CM

## 2021-02-18 NOTE — Telephone Encounter (Signed)
EGD scheduled for 10/24 at [facility] with GM at 1115 am.    EGD scheduled, pt instructed and medications reviewed.  Patient instructions mailed to home.  Patient to call with any questions or concerns.   The pt to call if she has any questions or concerns.

## 2021-02-18 NOTE — Telephone Encounter (Signed)
Eversole, Ammie, LPN  Loretha Stapler, RN [employer],   Routing this to you so that you can work on getting pt scheduled as requested below        Previous Messages   ----- Message -----  From: Lemar Lofty., MD  Sent: 02/17/2021   4:33 PM EDT  To: Deon Pilling, LPN  Subject: RE: UGI series                                 Ammie,  Please move forward with scheduling EGD with fluoroscopy next available with me.  I will review the upper GI series and GES when they are finalized.  Let me know what date it is scheduled for.  Thanks.  GM  ----- Message -----  From: Deon Pilling, LPN  Sent: 1/63/8466   9:08 AM EDT  To: Lemar Lofty., MD  Subject: FW: UGI series                                 Hi Dr. Meridee Score. Appears pt is having GES today and is scheduled for UGI series tomorrow. You requested to be notified of these dates so that you could proceed with determining when pt could be scheduled for hosp EGD w/ fluoro   Thanks  Ammie  ----- Message -----  From: Deon Pilling, LPN  Sent: 5/99/3570  12:00 AM EDT  To: Deon Pilling, LPN  Subject: UGI series                                     Has pt gotten scheduled. Inform Dr. Meridee Score of the date. Wants to schedule hosp EGD with fluoroscopy

## 2021-03-06 NOTE — Progress Notes (Signed)
Attempted to obtain medical history via telephone, unable to reach at this time. I left a voicemail to return pre surgical testing department's phone call.  

## 2021-03-17 ENCOUNTER — Encounter (HOSPITAL_COMMUNITY)
Admission: RE | Disposition: A | Discharge status: Home / Self Care | Payer: Self-pay | Source: Ambulatory Visit | Attending: Gastroenterology

## 2021-03-17 ENCOUNTER — Ambulatory Visit (HOSPITAL_COMMUNITY): Payer: BC Managed Care – PPO

## 2021-03-17 ENCOUNTER — Other Ambulatory Visit: Payer: Self-pay

## 2021-03-17 ENCOUNTER — Encounter (HOSPITAL_COMMUNITY): Payer: Self-pay | Admitting: Gastroenterology

## 2021-03-17 ENCOUNTER — Ambulatory Visit (HOSPITAL_COMMUNITY): Payer: BC Managed Care – PPO | Admitting: Certified Registered"

## 2021-03-17 ENCOUNTER — Ambulatory Visit (HOSPITAL_COMMUNITY)
Admission: RE | Admit: 2021-03-17 | Discharge: 2021-03-17 | Disposition: A | Discharge status: Home / Self Care | Payer: BC Managed Care – PPO | Source: Ambulatory Visit | Attending: Gastroenterology | Admitting: Gastroenterology

## 2021-03-17 ENCOUNTER — Telehealth: Payer: Self-pay

## 2021-03-17 DIAGNOSIS — R140 Abdominal distension (gaseous): Secondary | ICD-10-CM | POA: Insufficient documentation

## 2021-03-17 DIAGNOSIS — Z800 Family history of malignant neoplasm of digestive organs: Secondary | ICD-10-CM | POA: Insufficient documentation

## 2021-03-17 DIAGNOSIS — R1013 Epigastric pain: Secondary | ICD-10-CM | POA: Diagnosis not present

## 2021-03-17 DIAGNOSIS — B3781 Candidal esophagitis: Secondary | ICD-10-CM

## 2021-03-17 DIAGNOSIS — K2289 Other specified disease of esophagus: Secondary | ICD-10-CM | POA: Diagnosis not present

## 2021-03-17 DIAGNOSIS — Z86718 Personal history of other venous thrombosis and embolism: Secondary | ICD-10-CM | POA: Diagnosis not present

## 2021-03-17 DIAGNOSIS — K315 Obstruction of duodenum: Secondary | ICD-10-CM | POA: Insufficient documentation

## 2021-03-17 DIAGNOSIS — K3189 Other diseases of stomach and duodenum: Secondary | ICD-10-CM | POA: Diagnosis not present

## 2021-03-17 DIAGNOSIS — R1310 Dysphagia, unspecified: Secondary | ICD-10-CM

## 2021-03-17 DIAGNOSIS — K269 Duodenal ulcer, unspecified as acute or chronic, without hemorrhage or perforation: Secondary | ICD-10-CM | POA: Insufficient documentation

## 2021-03-17 DIAGNOSIS — R112 Nausea with vomiting, unspecified: Secondary | ICD-10-CM | POA: Insufficient documentation

## 2021-03-17 DIAGNOSIS — G8929 Other chronic pain: Secondary | ICD-10-CM | POA: Diagnosis not present

## 2021-03-17 DIAGNOSIS — R109 Unspecified abdominal pain: Secondary | ICD-10-CM | POA: Diagnosis not present

## 2021-03-17 DIAGNOSIS — Z91040 Latex allergy status: Secondary | ICD-10-CM | POA: Insufficient documentation

## 2021-03-17 DIAGNOSIS — R6881 Early satiety: Secondary | ICD-10-CM | POA: Diagnosis not present

## 2021-03-17 DIAGNOSIS — K449 Diaphragmatic hernia without obstruction or gangrene: Secondary | ICD-10-CM | POA: Diagnosis not present

## 2021-03-17 HISTORY — PX: ESOPHAGOGASTRODUODENOSCOPY (EGD) WITH PROPOFOL: SHX5813

## 2021-03-17 HISTORY — PX: BIOPSY: SHX5522

## 2021-03-17 HISTORY — PX: BALLOON DILATION: SHX5330

## 2021-03-17 SURGERY — ESOPHAGOGASTRODUODENOSCOPY (EGD) WITH PROPOFOL
Anesthesia: Monitor Anesthesia Care

## 2021-03-17 MED ORDER — LACTATED RINGERS IV SOLN
INTRAVENOUS | Status: AC | PRN
  Administered 2021-03-17: 1000 mL via INTRAVENOUS

## 2021-03-17 MED ORDER — LIDOCAINE 2% (20 MG/ML) 5 ML SYRINGE
INTRAMUSCULAR | Status: DC | PRN
  Administered 2021-03-17: 60 mg via INTRAVENOUS

## 2021-03-17 MED ORDER — PROMETHAZINE HCL 12.5 MG PO TABS: 12.5000 mg | ORAL_TABLET | Freq: Four times a day (QID) | ORAL | 2 refills | Status: DC | PRN

## 2021-03-17 MED ORDER — LACTATED RINGERS IV SOLN: INTRAVENOUS | Status: DC

## 2021-03-17 MED ORDER — SUCRALFATE 1 G PO TABS: 1.0000 g | ORAL_TABLET | Freq: Three times a day (TID) | ORAL | 1 refills | Status: DC

## 2021-03-17 MED ORDER — ONDANSETRON HCL 4 MG/2ML IJ SOLN
INTRAMUSCULAR | Status: DC | PRN
  Administered 2021-03-17: 4 mg via INTRAVENOUS

## 2021-03-17 MED ORDER — PROPOFOL 500 MG/50ML IV EMUL
INTRAVENOUS | Status: DC | PRN
  Administered 2021-03-17: 120 ug/kg/min via INTRAVENOUS

## 2021-03-17 MED ORDER — PROCHLORPERAZINE 25 MG RE SUPP: 25.0000 mg | Freq: Two times a day (BID) | RECTAL | 0 refills | Status: DC | PRN

## 2021-03-17 MED ORDER — SODIUM CHLORIDE 0.9 % IV SOLN: INTRAVENOUS | Status: DC

## 2021-03-17 MED ORDER — PROPOFOL 10 MG/ML IV BOLUS
INTRAVENOUS | Status: DC | PRN
  Administered 2021-03-17 (×3): 10 mg via INTRAVENOUS
  Administered 2021-03-17: 40 mg via INTRAVENOUS

## 2021-03-17 MED ORDER — IOHEXOL 300 MG/ML  SOLN
INTRAMUSCULAR | Status: DC | PRN
  Administered 2021-03-17: 30 mL

## 2021-03-17 SURGICAL SUPPLY — 15 items

## 2021-03-17 NOTE — Telephone Encounter (Signed)
-----   Message from Lemar Lofty., MD sent at 03/17/2021  1:02 PM EDT ----- Regarding: Follow-up Ayelet Gruenewald, This patient needs a repeat EGD with dilation in hospital-based setting with fluoroscopy in 3 to 4 weeks. Please schedule as able. Let me know the date. Thanks. GM

## 2021-03-17 NOTE — H&P (Signed)
GASTROENTEROLOGY PROCEDURE H&P NOTE   Primary Care Physician: Irena Reichmann, DO  HPI: Patricia Hart is a 60 y.o. female who presents for EGD with duodenal stricture dilation attempt.  For further work-up of chronic abdominal pain, bloating, nausea and vomiting.  Past Medical History:  Diagnosis Date   Arthritis    Blood transfusion without reported diagnosis 1997   Clotting disorder (HCC) 1997   pregnancy induced dvt- no problems since then   Fibromyalgia    GERD (gastroesophageal reflux disease)    History of DVT (deep vein thrombosis)    during pregnancy   History of pulmonary embolism    occurred during pregnancy   Overactive bladder    Past Surgical History:  Procedure Laterality Date   ABDOMINAL EXPOSURE N/A 02/19/2016   Procedure: ABDOMINAL EXPOSURE;  Surgeon: Larina Earthly, MD;  Location: [facility];  Service: Vascular;  Laterality: N/A;   ABLATION  2006   endometrial   ANTERIOR LUMBAR FUSION N/A 02/19/2016   Procedure: Anterior Lumbar Interbody Fusion  - Lumbar five-sacral one;  Surgeon: Tia Alert, MD;  Location: [facility];  Service: Neurosurgery;  Laterality: N/A;   BACK SURGERY     breast implants     saline   CARPAL TUNNEL RELEASE Right 04/23/2015   Procedure: RIGHT CARPAL TUNNEL RELEASE;  Surgeon: Cindee Salt, MD;  Location: [facility];  Service: Orthopedics;  Laterality: Right;   CARPAL TUNNEL RELEASE Left 05/16/2015   Procedure: LEFT CARPAL TUNNEL RELEASE;  Surgeon: Cindee Salt, MD;  Location: [facility];  Service: Orthopedics;  Laterality: Left;   TONSILLECTOMY     TRIGGER FINGER RELEASE Right 04/23/2015   Procedure: RELEASE TRIGGER FINGER/A-1 PULLEY RIGHT THUMB;  Surgeon: Cindee Salt, MD;  Location: [facility];  Service: Orthopedics;  Laterality: Right;   WISDOM TOOTH EXTRACTION     Current Facility-Administered Medications  Medication Dose Route Frequency Provider Last Rate Last Admin   0.9 %  sodium  chloride infusion   Intravenous Continuous Mansouraty, Netty Starring., MD       lactated ringers infusion   Intravenous Continuous Mansouraty, Netty Starring., MD       lactated ringers infusion   Intravenous Continuous Mansouraty, Netty Starring., MD       lactated ringers infusion    Continuous PRN Mansouraty, Netty Starring., MD 10 mL/hr at 03/17/21 1008 1,000 mL at 03/17/21 1008    Current Facility-Administered Medications:    0.9 %  sodium chloride infusion, , Intravenous, Continuous, Mansouraty, Netty Starring., MD   lactated ringers infusion, , Intravenous, Continuous, Mansouraty, Netty Starring., MD   lactated ringers infusion, , Intravenous, Continuous, Mansouraty, Netty Starring., MD   lactated ringers infusion, , , Continuous PRN, Mansouraty, Netty Starring., MD, Last Rate: 10 mL/hr at 03/17/21 1008, 1,000 mL at 03/17/21 1008 Allergies  Allergen Reactions   Latex Other (See Comments)    Skin irritation   Family History  Problem Relation Age of Onset   Colon polyps Mother    Hyperlipidemia Mother    Colon cancer Mother        in her 77s or 74s   Osteoporosis Mother    Lung cancer Mother    Diabetes Father    Hyperlipidemia Father    Hypertension Father    Breast cancer Maternal Aunt        40's   Pancreatic cancer Neg Hx    Stomach cancer Neg Hx    Liver disease Neg Hx  Inflammatory bowel disease Neg Hx    Esophageal cancer Neg Hx    Social History   Socioeconomic History   Marital status: Divorced    Spouse name: Not on file   Number of children: Not on file   Years of education: Not on file   Highest education level: Not on file  Occupational History   Not on file  Tobacco Use   Smoking status: Never   Smokeless tobacco: Never  Vaping Use   Vaping Use: Never used  Substance and Sexual Activity   Alcohol use: Yes    Alcohol/week: 6.0 standard drinks    Types: 4 Glasses of wine, 2 Standard drinks or equivalent per week    Comment: occ (currently none due to stomach issues 01/29/21)    Drug use: No   Sexual activity: Not Currently    Partners: Male    Birth control/protection: Surgical    Comment: Ablation  Other Topics Concern   Not on file  Social History Narrative   Not on file   Social Determinants of Health   Financial Resource Strain: Not on file  Food Insecurity: Not on file  Transportation Needs: Not on file  Physical Activity: Not on file  Stress: Not on file  Social Connections: Not on file  Intimate Partner Violence: Not on file    Physical Exam: Today's Vitals   03/17/21 1000  BP: 130/85  Pulse: 76  Resp: 20  Temp: 98.2 F (36.8 C)  TempSrc: Oral  SpO2: 99%  Weight: 56.7 kg  Height: 5\' 3"  (1.6 m)  PainSc: 0-No pain   Body mass index is 22.14 kg/m. GEN: NAD EYE: Sclerae anicteric ENT: MMM CV: Non-tachycardic GI: Soft, NT/ND NEURO:  Alert & Oriented x 3  Lab Results: No results for input(s): WBC, HGB, HCT, PLT in the last 72 hours. BMET No results for input(s): NA, K, CL, CO2, GLUCOSE, BUN, CREATININE, CALCIUM in the last 72 hours. LFT No results for input(s): PROT, ALBUMIN, AST, ALT, ALKPHOS, BILITOT, BILIDIR, IBILI in the last 72 hours. PT/INR No results for input(s): LABPROT, INR in the last 72 hours.   Impression / Plan: This is a 60 y.o.female who presents for EGD with duodenal stricture dilation attempt.  For further work-up of chronic abdominal pain, bloating, nausea and vomiting.  The risks and benefits of endoscopic evaluation/treatment were discussed with the patient and/or family; these include but are not limited to the risk of perforation, infection, bleeding, missed lesions, lack of diagnosis, severe illness requiring hospitalization, as well as anesthesia and sedation related illnesses.  The patient's history has been reviewed, patient examined, no change in status, and deemed stable for procedure.  The patient and/or family is agreeable to proceed.    46, MD [facility] Advanced  Endoscopy Office # Corliss Parish

## 2021-03-17 NOTE — Discharge Instructions (Signed)
YOU HAD AN ENDOSCOPIC PROCEDURE TODAY: Refer to the procedure report and other information in the discharge instructions given to you for any specific questions about what was found during the examination. If this information does not answer your questions, please call [place] office at [phone] to clarify.   YOU SHOULD EXPECT: Some feelings of bloating in the abdomen. Passage of more gas than usual. Walking can help get rid of the air that was put into your GI tract during the procedure and reduce the bloating. If you had a lower endoscopy (such as a colonoscopy or flexible sigmoidoscopy) you may notice spotting of blood in your stool or on the toilet paper. Some abdominal soreness may be present for a day or two, also.  DIET: Your first meal following the procedure should be a light meal and then it is ok to progress to your normal diet. A half-sandwich or bowl of soup is an example of a good first meal. Heavy or fried foods are harder to digest and may make you feel nauseous or bloated. Drink plenty of fluids but you should avoid alcoholic beverages for 24 hours. If you had a esophageal dilation, please see attached instructions for diet.    ACTIVITY: Your care partner should take you home directly after the procedure. You should plan to take it easy, moving slowly for the rest of the day. You can resume normal activity the day after the procedure however YOU SHOULD NOT DRIVE, use power tools, machinery or perform tasks that involve climbing or major physical exertion for 24 hours (because of the sedation medicines used during the test).   SYMPTOMS TO REPORT IMMEDIATELY: A gastroenterologist can be reached at any hour. Please call [phone]  for any of the following symptoms:   Following upper endoscopy (EGD, EUS, ERCP, esophageal dilation) Vomiting of blood or coffee ground material  New, significant abdominal pain  New, significant chest pain or pain under the shoulder blades  Painful or  persistently difficult swallowing  New shortness of breath  Black, tarry-looking or red, bloody stools  FOLLOW UP:  If any biopsies were taken you will be contacted by phone or by letter within the next 1-3 weeks. Call [phone]  if you have not heard about the biopsies in 3 weeks.  Please also call with any specific questions about appointments or follow up tests.  

## 2021-03-17 NOTE — Telephone Encounter (Signed)
The pt has been scheduled for 04/21/21 at 2 pm at [facility] for EGD Dil with Fluoro.  Instructions have been mailed and sent to the pt via My Chart. Left message on machine to call back

## 2021-03-17 NOTE — Op Note (Signed)
[facility] Patient Name: Patricia Hart Procedure Date: 03/17/2021 MRN: [id number] Attending MD: Justice Britain , MD Date of Birth: Oct 11, 1960 CSN: [id number] Age: 60 Admit Type: Outpatient Procedure:                Upper GI endoscopy Indications:              Epigastric abdominal pain, Stenosis of the                            duodenum, For therapy of duodenal stenosis,                            Abdominal bloating Providers:                Justice Britain, MD, Burtis Junes, RN, Hinton Dyer Referring MD:             Jayme Cloud. Collins Medicines:                Monitored Anesthesia Care Complications:            No immediate complications. Estimated Blood Loss:     Estimated blood loss was minimal. Procedure:                Pre-Anesthesia Assessment:                           - Prior to the procedure, a History and Physical                            was performed, and patient medications and                            allergies were reviewed. The patient's tolerance of                            previous anesthesia was also reviewed. The risks                            and benefits of the procedure and the sedation                            options and risks were discussed with the patient.                            All questions were answered, and informed consent                            was obtained. Prior Anticoagulants: The patient has                            taken no previous anticoagulant or antiplatelet                            agents. ASA Grade Assessment: III - A patient with  severe systemic disease. After reviewing the risks                            and benefits, the patient was deemed in                            satisfactory condition to undergo the procedure.                           After obtaining informed consent, the endoscope was                            passed under direct vision. Throughout the                             procedure, the patient's blood pressure, pulse, and                            oxygen saturations were monitored continuously. The                            GIF-H190 ([id number]) Olympus endoscope was introduced                            through the mouth, and advanced to the second part                            of duodenum. The GIF-XP190N ([id number]) Olympus slim                            endoscope was introduced through the mouth, and                            advanced to the second part of duodenum. The upper                            GI endoscopy was accomplished without difficulty.                            The patient tolerated the procedure. Scope In: Scope Out: Findings:      No gross lesions were noted in the entire esophagus.      The Z-line was irregular and was found 35 cm from the incisors.      A 1 cm hiatal hernia was present.      Patchy mildly erythematous mucosa without bleeding was found in the       entire examined stomach - previously biopsied.      One non-bleeding cratered duodenal ulcer with a clean ulcer base       (Forrest Class III) was found in the D1/D2 sweep of the duodenum. The       lesion was 10 mm in largest dimension - this is proximal to the upcoming       stenosis - query this is from previous dilation.      An acquired benign-appearing, intrinsic severe stenosis  was found in the       D1/D2 sweep of the duodenum and was dilated. I could visualize the D2       region after the use of the neonatal endoscope which traversed the       region. Placement of a long 0.025 inch Antonietta Breach was attempted. This       passed successfully into the duodenum to allow for a more adequate       visualization of the stricture and ulcer to try to minimize issues. I       then transitioned back to the adult endoscope. A TTS dilator was passed       through the scope. Dilation with an 12-31-08 mm pyloric balloon dilator       was performed under  fluoroscopic guidance up to 10 mm. I visualized a       mucosal wrent. After gentle pressure I was able to traverse this region       with the adult endoscope into the D2 region. Marland Kitchen      Patchy atrophic mucosa was found in the second portion of the duodenum.       Biopsies for histology were taken with a cold forceps for evaluation of       celiac disease. Impression:               - No gross lesions in esophagus. Z-line irregular,                            35 cm from the incisors.                           - 1 cm hiatal hernia.                           - Erythematous mucosa in the stomach - previously                            biopsied..                           - Non-bleeding duodenal ulcer with a clean ulcer                            base (Forrest Class III) proximal to the upcoming                            stenosis/stricture.                           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                            Dilated under fluoroscopy. Traverse region with                            neonatal endoscope. But after dilation was able to                            traverse the 10 mm adult  endoscope. Mucosal wrent                            was noted within the region. Moderate Sedation:      Not Applicable - Patient had care per Anesthesia. Recommendation:           - The patient will be observed post-procedure,                            until all discharge criteria are met.                           - Discharge patient to home.                           - Patient has a contact number available for                            emergencies. The signs and symptoms of potential                            delayed complications were discussed with the                            patient. Return to normal activities tomorrow.                            Written discharge instructions were provided to the                            patient.                           - Full  liquid diet today and if doing well then                            advance to soft diet.                           - Continue present medications of Protonix BID and                            Carafate QAC + QHS to optimize and heal the                            ulceration but also help the stenosed region.                           - Await pathology results.                           - Observe patient's clinical course.                           - Repeat EGD with dilation in 3-4 weeks in hospital  based setting with short CRE balloons.                           - The findings and recommendations were discussed                            with the patient. Procedure Code(s):        --- Professional ---                           [phone], Esophagogastroduodenoscopy, flexible,                            transoral; with dilation of gastric/duodenal                            stricture(s) (eg, balloon, bougie) Diagnosis Code(s):        --- Professional ---                           K22.8, Other specified diseases of esophagus                           K44.9, Diaphragmatic hernia without obstruction or                            gangrene                           K31.89, Other diseases of stomach and duodenum                           K26.9, Duodenal ulcer, unspecified as acute or                            chronic, without hemorrhage or perforation                           K31.5, Obstruction of duodenum                           R10.13, Epigastric pain                           R14.0, Abdominal distension (gaseous) CPT copyright 2019 [employer]. All rights reserved. The codes documented in this report are preliminary and upon coder review may  be revised to meet current compliance requirements. Justice Britain, MD 03/17/2021 12:46:57 PM Number of Addenda: 0

## 2021-03-17 NOTE — Transfer of Care (Signed)
Immediate Anesthesia Transfer of Care Note  Patient: Jadalee Westcott  Procedure(s) Performed: ESOPHAGOGASTRODUODENOSCOPY (EGD) WITH PROPOFOL BALLOON DILATION BIOPSY  Patient Location: Endoscopy Unit  Anesthesia Type:MAC  Level of Consciousness: awake, alert , oriented and patient cooperative  Airway & Oxygen Therapy: Patient Spontanous Breathing and Patient connected to nasal cannula oxygen  Post-op Assessment: Report given to RN, Post -op Vital signs reviewed and stable and Patient moving all extremities  Post vital signs: Reviewed and stable  Last Vitals:  Vitals Value Taken Time  BP 133/80 03/17/21 1246  Temp    Pulse 83 03/17/21 1247  Resp 15 03/17/21 1247  SpO2 97 % 03/17/21 1247  Vitals shown include unvalidated device data.  Last Pain:  Vitals:   03/17/21 1000  TempSrc: Oral  PainSc: 0-No pain         Complications: No notable events documented.

## 2021-03-17 NOTE — Anesthesia Preprocedure Evaluation (Signed)
Anesthesia Evaluation  Patient identified by MRN, date of birth, ID band Patient awake    Reviewed: Allergy & Precautions, NPO status , Patient's Chart, lab work & pertinent test results  Airway Mallampati: II  TM Distance: >3 FB     Dental  (+) Dental Advisory Given   Pulmonary neg pulmonary ROS,    breath sounds clear to auscultation       Cardiovascular + DVT   Rhythm:Regular Rate:Normal     Neuro/Psych  Neuromuscular disease    GI/Hepatic Neg liver ROS, GERD  ,  Endo/Other  negative endocrine ROS  Renal/GU negative Renal ROS     Musculoskeletal  (+) Arthritis , Fibromyalgia -  Abdominal   Peds  Hematology negative hematology ROS (+)   Anesthesia Other Findings   Reproductive/Obstetrics                             Lab Results  Component Value Date   WBC 8.9 01/29/2021   HGB 13.4 01/29/2021   HCT 39.7 01/29/2021   MCV 94.0 01/29/2021   PLT 283.0 01/29/2021   Lab Results  Component Value Date   CREATININE 0.68 01/21/2021   BUN 19 01/21/2021   NA 135 01/21/2021   K 3.7 01/21/2021   CL 97 (L) 01/21/2021   CO2 25 01/21/2021    Anesthesia Physical Anesthesia Plan  ASA: 3  Anesthesia Plan: MAC   Post-op Pain Management:    Induction:   PONV Risk Score and Plan: 2 and Propofol infusion, Ondansetron and Treatment may vary due to age or medical condition  Airway Management Planned: Natural Airway and Nasal Cannula  Additional Equipment: None  Intra-op Plan:   Post-operative Plan:   Informed Consent: I have reviewed the patients History and Physical, chart, labs and discussed the procedure including the risks, benefits and alternatives for the proposed anesthesia with the patient or authorized representative who has indicated his/her understanding and acceptance.       Plan Discussed with: CRNA  Anesthesia Plan Comments:         Anesthesia Quick  Evaluation

## 2021-03-17 NOTE — Anesthesia Postprocedure Evaluation (Signed)
Anesthesia Post Note  Patient: Patricia Hart  Procedure(s) Performed: ESOPHAGOGASTRODUODENOSCOPY (EGD) WITH PROPOFOL BALLOON DILATION BIOPSY     Patient location during evaluation: PACU Anesthesia Type: MAC Level of consciousness: awake and alert Pain management: pain level controlled Vital Signs Assessment: post-procedure vital signs reviewed and stable Respiratory status: spontaneous breathing, nonlabored ventilation, respiratory function stable and patient connected to nasal cannula oxygen Cardiovascular status: stable and blood pressure returned to baseline Postop Assessment: no apparent nausea or vomiting Anesthetic complications: no   No notable events documented.  Last Vitals:  Vitals:   03/17/21 1250 03/17/21 1300  BP: 120/81 140/83  Pulse: 74 74  Resp: 10 19  Temp:    SpO2: 100% 98%    Last Pain:  Vitals:   03/17/21 1300  TempSrc:   PainSc: 0-No pain                 Tiajuana Amass

## 2021-03-18 ENCOUNTER — Encounter: Payer: Self-pay | Admitting: Gastroenterology

## 2021-03-18 LAB — SURGICAL PATHOLOGY

## 2021-03-18 NOTE — Telephone Encounter (Signed)
EGD scheduled, pt instructed and medications reviewed.  Patient instructions mailed to home.  Patient to call with any questions or concerns.  

## 2021-04-21 ENCOUNTER — Ambulatory Visit (HOSPITAL_COMMUNITY): Payer: BC Managed Care – PPO | Admitting: Anesthesiology

## 2021-04-21 ENCOUNTER — Ambulatory Visit (HOSPITAL_COMMUNITY)
Admission: RE | Admit: 2021-04-21 | Discharge: 2021-04-21 | Disposition: A | Discharge status: Home / Self Care | Payer: BC Managed Care – PPO | Source: Ambulatory Visit | Attending: Gastroenterology | Admitting: Gastroenterology

## 2021-04-21 ENCOUNTER — Encounter (HOSPITAL_COMMUNITY)
Admission: RE | Disposition: A | Discharge status: Home / Self Care | Payer: Self-pay | Source: Ambulatory Visit | Attending: Gastroenterology

## 2021-04-21 ENCOUNTER — Other Ambulatory Visit: Payer: Self-pay

## 2021-04-21 ENCOUNTER — Encounter (HOSPITAL_COMMUNITY): Payer: Self-pay | Admitting: Gastroenterology

## 2021-04-21 ENCOUNTER — Telehealth: Payer: Self-pay

## 2021-04-21 DIAGNOSIS — R112 Nausea with vomiting, unspecified: Secondary | ICD-10-CM | POA: Diagnosis not present

## 2021-04-21 DIAGNOSIS — K2970 Gastritis, unspecified, without bleeding: Secondary | ICD-10-CM

## 2021-04-21 DIAGNOSIS — K315 Obstruction of duodenum: Secondary | ICD-10-CM | POA: Diagnosis not present

## 2021-04-21 DIAGNOSIS — K219 Gastro-esophageal reflux disease without esophagitis: Secondary | ICD-10-CM | POA: Insufficient documentation

## 2021-04-21 DIAGNOSIS — K269 Duodenal ulcer, unspecified as acute or chronic, without hemorrhage or perforation: Secondary | ICD-10-CM

## 2021-04-21 DIAGNOSIS — K449 Diaphragmatic hernia without obstruction or gangrene: Secondary | ICD-10-CM | POA: Diagnosis not present

## 2021-04-21 DIAGNOSIS — R1084 Generalized abdominal pain: Secondary | ICD-10-CM | POA: Insufficient documentation

## 2021-04-21 HISTORY — PX: ESOPHAGOGASTRODUODENOSCOPY (EGD) WITH PROPOFOL: SHX5813

## 2021-04-21 HISTORY — PX: BALLOON DILATION: SHX5330

## 2021-04-21 SURGERY — ESOPHAGOGASTRODUODENOSCOPY (EGD) WITH PROPOFOL
Anesthesia: Monitor Anesthesia Care

## 2021-04-21 MED ORDER — PROPOFOL 1000 MG/100ML IV EMUL
INTRAVENOUS | Status: AC
  Filled 2021-04-21: qty 100

## 2021-04-21 MED ORDER — PROPOFOL 10 MG/ML IV BOLUS
INTRAVENOUS | Status: DC | PRN
  Administered 2021-04-21: 10 mg via INTRAVENOUS
  Administered 2021-04-21: 20 mg via INTRAVENOUS
  Administered 2021-04-21: 10 mg via INTRAVENOUS

## 2021-04-21 MED ORDER — GLYCOPYRROLATE 0.2 MG/ML IJ SOLN
INTRAMUSCULAR | Status: DC | PRN
  Administered 2021-04-21: .1 mg via INTRAVENOUS

## 2021-04-21 MED ORDER — LACTATED RINGERS IV SOLN: INTRAVENOUS | Status: DC

## 2021-04-21 MED ORDER — SUCRALFATE 1 G PO TABS: 1.0000 g | ORAL_TABLET | Freq: Three times a day (TID) | ORAL | 2 refills | Status: DC

## 2021-04-21 MED ORDER — ESMOLOL HCL 100 MG/10ML IV SOLN
INTRAVENOUS | Status: DC | PRN
  Administered 2021-04-21 (×3): 20 mg via INTRAVENOUS

## 2021-04-21 MED ORDER — PROPOFOL 500 MG/50ML IV EMUL
INTRAVENOUS | Status: DC | PRN
  Administered 2021-04-21 (×2): 125 ug/kg/min via INTRAVENOUS

## 2021-04-21 SURGICAL SUPPLY — 15 items

## 2021-04-21 NOTE — Transfer of Care (Signed)
Immediate Anesthesia Transfer of Care Note  Patient: Patricia Hart  Procedure(s) Performed: ESOPHAGOGASTRODUODENOSCOPY (EGD) WITH PROPOFOL BALLOON DILATION  Patient Location: PACU and Endoscopy Unit  Anesthesia Type:MAC  Level of Consciousness: awake, alert , oriented and patient cooperative  Airway & Oxygen Therapy: Patient Spontanous Breathing and Patient connected to face mask oxygen  Post-op Assessment: Report given to RN and Post -op Vital signs reviewed and stable  Post vital signs: Reviewed and stable  Last Vitals:  Vitals Value Taken Time  BP 135/84 04/21/21 0934  Temp    Pulse 75 04/21/21 0937  Resp 18 04/21/21 0937  SpO2 100 % 04/21/21 0937  Vitals shown include unvalidated device data.  Last Pain:  Vitals:   04/21/21 0844  TempSrc: Oral  PainSc: 0-No pain         Complications: No notable events documented.

## 2021-04-21 NOTE — Op Note (Signed)
[facility] Patient Name: Patricia Hart Procedure Date: 04/21/2021 MRN: [id number] Attending MD: Justice Britain , MD Date of Birth: 12/13/1960 CSN: [id number] Age: 60 Admit Type: Outpatient Procedure:                Upper GI endoscopy Indications:              Generalized abdominal pain, Stenosis of the                            duodenum, Follow-up of duodenal stenosis, For                            therapy of duodenal stenosis, Nausea with vomiting Providers:                Justice Britain, MD, Burtis Junes, RN, Hinton Dyer                            Technician, Technician Referring MD:             Janie Morning Medicines:                Monitored Anesthesia Care Complications:            No immediate complications. Estimated Blood Loss:     Estimated blood loss was minimal. Procedure:                Pre-Anesthesia Assessment:                           - Prior to the procedure, a History and Physical                            was performed, and patient medications and                            allergies were reviewed. The patient's tolerance of                            previous anesthesia was also reviewed. The risks                            and benefits of the procedure and the sedation                            options and risks were discussed with the patient.                            All questions were answered, and informed consent                            was obtained. Prior Anticoagulants: The patient has                            taken no previous anticoagulant or antiplatelet  agents. ASA Grade Assessment: III - A patient with                            severe systemic disease. After reviewing the risks                            and benefits, the patient was deemed in                            satisfactory condition to undergo the procedure.                           After obtaining informed consent, the endoscope  was                            passed under direct vision. Throughout the                            procedure, the patient's blood pressure, pulse, and                            oxygen saturations were monitored continuously. The                            GIF-H190 ([id number]) Olympus endoscope was introduced                            through the mouth, and advanced to the second part                            of duodenum. The upper GI endoscopy was                            accomplished without difficulty. The patient                            tolerated the procedure. Scope In: Scope Out: Findings:      No gross lesions were noted in the entire esophagus.      The Z-line was regular and was found 38 cm from the incisors.      A 2 cm hiatal hernia was present.      Patchy mild inflammation characterized by erosions and erythema was       found in the entire examined stomach. Not biopsied today, as it has       previously been biopsied and negative for HP.      One non-bleeding cratered duodenal ulcer with a clean ulcer base       (Forrest Class III) was found in the D1/D2 sweep. The lesion was 10 mm       in largest dimension. This looks slightly healed compared to previous       examination. It is just proximal to the duodenal stenosis noted below.      An acquired benign-appearing, intrinsic severe stenosis was found in the       first portion of the duodenum and in the second  portion of the duodenum       and was not initially traversed with the adult endoscope but was       traversed after dilation. A TTS dilator was passed through the scope.       Dilation with an 12-31-08 mm which led to the scope then being able to       pass into D2. Subsequently, a second round of dilations with a 03-05-11       mm pyloric balloon dilator was performed. The dilation site was examined       following endoscope reinsertion and showed moderate mucosal       disruption/wrent, moderate improvement  in luminal narrowing and expected       oozing from the stricture and ulcer region. No perforation was noted.      No other gross lesions were noted in the duodenal bulb and in the second       portion of the duodenum. Impression:               - No gross lesions in esophagus. Z-line regular, 38                            cm from the incisors.                           - 2 cm hiatal hernia.                           - Gastritis - previously biopsied.                           - Non-bleeding duodenal ulcer with a clean ulcer                            base (Forrest Class III) in D1/D2 sweep proximal to                            the duodenal stricture. Just distal to this ulcer                            is an acquired duodenal stenosis - initially could                            not traverse with endoscope but after dilation to                            10 mm was able to and then proceeded with a                            dilation up to 12 mm. Mucosal wrents/oozing noted                            after dilation but no overt perforation.                           - No other gross lesions in the duodenal bulb and  in the second portion of the duodenum. Moderate Sedation:      Not Applicable - Patient had care per Anesthesia. Recommendation:           - The patient will be observed post-procedure,                            until all discharge criteria are met.                           - Discharge patient to home.                           - Patient has a contact number available for                            emergencies. The signs and symptoms of potential                            delayed complications were discussed with the                            patient. Return to normal activities tomorrow.                            Written discharge instructions were provided to the                            patient.                           - Full liquid diet  today. As able then may be able                            to advance diet to very soft foods.                           - Continue Protonix 40 mg twice daily (take 30                            minutes before breakfast and 30 minutes before                            dinner).                           - Continue Carafate elixer (dissolve the Carafate)                            and take 2-4 times daily. If able to take 4 times                            daily (with each meal and at bedtime) will  hopefully be able to allow this area to continue to                            heal and not stricture again.                           - Recommend repeat EGD in 3-4 weeks for further                            dilation - we will work on scheduling. May consider                            steroid injection for next dilation. Would do this                            in the hospital before transitioning to the [facility].                           - The findings and recommendations were discussed                            with the patient. Procedure Code(s):        --- Professional ---                           [phone], Esophagogastroduodenoscopy, flexible,                            transoral; with dilation of gastric/duodenal                            stricture(s) (eg, balloon, bougie) Diagnosis Code(s):        --- Professional ---                           K44.9, Diaphragmatic hernia without obstruction or                            gangrene                           K29.70, Gastritis, unspecified, without bleeding                           K26.9, Duodenal ulcer, unspecified as acute or                            chronic, without hemorrhage or perforation                           K31.5, Obstruction of duodenum CPT copyright 2019 American [employer] Association. All rights reserved. The codes documented in this report are preliminary and upon coder review may  be revised to meet  current compliance requirements. Justice Britain, MD 04/21/2021 9:38:37 AM Number of Addenda: 0

## 2021-04-21 NOTE — Anesthesia Preprocedure Evaluation (Addendum)
Anesthesia Evaluation  Patient identified by MRN, date of birth, ID band Patient awake    Reviewed: Allergy & Precautions, H&P , NPO status , Patient's Chart, lab work & pertinent test results  Airway Mallampati: II  TM Distance: >3 FB Neck ROM: Full    Dental no notable dental hx. (+) Teeth Intact   Pulmonary neg pulmonary ROS,    Pulmonary exam normal breath sounds clear to auscultation       Cardiovascular negative cardio ROS   Rhythm:Regular Rate:Normal     Neuro/Psych negative neurological ROS  negative psych ROS   GI/Hepatic Neg liver ROS, GERD  Medicated,  Endo/Other  negative endocrine ROS  Renal/GU negative Renal ROS  negative genitourinary   Musculoskeletal  (+) Arthritis , Osteoarthritis,  Fibromyalgia -  Abdominal   Peds  Hematology negative hematology ROS (+)   Anesthesia Other Findings   Reproductive/Obstetrics negative OB ROS                            Anesthesia Physical Anesthesia Plan  ASA: 2  Anesthesia Plan: MAC   Post-op Pain Management: Minimal or no pain anticipated   Induction: Intravenous  PONV Risk Score and Plan: 2 and Propofol infusion and Treatment may vary due to age or medical condition  Airway Management Planned: Simple Face Mask  Additional Equipment:   Intra-op Plan:   Post-operative Plan:   Informed Consent: I have reviewed the patients History and Physical, chart, labs and discussed the procedure including the risks, benefits and alternatives for the proposed anesthesia with the patient or authorized representative who has indicated his/her understanding and acceptance.     Dental advisory given  Plan Discussed with: CRNA  Anesthesia Plan Comments:         Anesthesia Quick Evaluation

## 2021-04-21 NOTE — H&P (Signed)
GASTROENTEROLOGY PROCEDURE H&P NOTE   Primary Care Physician: Irena Reichmann, DO  HPI: Patricia Hart is a 60 y.o. female who presents for EGD for duodenal stricture dilation.  Last dilation up to 10 mm will hopefully get up to 12 to 13.5 mm today.  Past Medical History:  Diagnosis Date   Arthritis    Blood transfusion without reported diagnosis 1997   Clotting disorder (HCC) 1997   pregnancy induced dvt- no problems since then   Fibromyalgia    GERD (gastroesophageal reflux disease)    History of DVT (deep vein thrombosis)    during pregnancy   History of pulmonary embolism    occurred during pregnancy   Overactive bladder    Past Surgical History:  Procedure Laterality Date   ABDOMINAL EXPOSURE N/A 02/19/2016   Procedure: ABDOMINAL EXPOSURE;  Surgeon: Larina Earthly, MD;  Location: [facility];  Service: Vascular;  Laterality: N/A;   ABLATION  2006   endometrial   ANTERIOR LUMBAR FUSION N/A 02/19/2016   Procedure: Anterior Lumbar Interbody Fusion  - Lumbar five-sacral one;  Surgeon: Tia Alert, MD;  Location: [facility];  Service: Neurosurgery;  Laterality: N/A;   BACK SURGERY     BALLOON DILATION N/A 03/17/2021   Procedure: BALLOON DILATION;  Surgeon: Mansouraty, Netty Starring., MD;  Location: [facility];  Service: Gastroenterology;  Laterality: N/A;   BIOPSY  03/17/2021   Procedure: BIOPSY;  Surgeon: Meridee Score Netty Starring., MD;  Location: [facility];  Service: Gastroenterology;;   breast implants     saline   CARPAL TUNNEL RELEASE Right 04/23/2015   Procedure: RIGHT CARPAL TUNNEL RELEASE;  Surgeon: Cindee Salt, MD;  Location: [facility];  Service: Orthopedics;  Laterality: Right;   CARPAL TUNNEL RELEASE Left 05/16/2015   Procedure: LEFT CARPAL TUNNEL RELEASE;  Surgeon: Cindee Salt, MD;  Location: [facility];  Service: Orthopedics;  Laterality: Left;   ESOPHAGOGASTRODUODENOSCOPY (EGD) WITH PROPOFOL N/A 03/17/2021   Procedure:  ESOPHAGOGASTRODUODENOSCOPY (EGD) WITH PROPOFOL;  Surgeon: Meridee Score Netty Starring., MD;  Location: [facility];  Service: Gastroenterology;  Laterality: N/A;  Fluoro   TONSILLECTOMY     TRIGGER FINGER RELEASE Right 04/23/2015   Procedure: RELEASE TRIGGER FINGER/A-1 PULLEY RIGHT THUMB;  Surgeon: Cindee Salt, MD;  Location: [facility];  Service: Orthopedics;  Laterality: Right;   WISDOM TOOTH EXTRACTION     No current facility-administered medications for this encounter.   No current facility-administered medications for this encounter. Allergies  Allergen Reactions   Latex Other (See Comments)    Skin irritation   Family History  Problem Relation Age of Onset   Colon polyps Mother    Hyperlipidemia Mother    Colon cancer Mother        in her 26s or 39s   Osteoporosis Mother    Lung cancer Mother    Diabetes Father    Hyperlipidemia Father    Hypertension Father    Breast cancer Maternal Aunt        14's   Pancreatic cancer Neg Hx    Stomach cancer Neg Hx    Liver disease Neg Hx    Inflammatory bowel disease Neg Hx    Esophageal cancer Neg Hx    Social History   Socioeconomic History   Marital status: Divorced    Spouse name: Not on file   Number of children: Not on file   Years of education: Not on file   Highest education level: Not on file  Occupational History   Not on file  Tobacco Use   Smoking status: Never   Smokeless tobacco: Never  Vaping Use   Vaping Use: Never used  Substance and Sexual Activity   Alcohol use: Yes    Alcohol/week: 6.0 standard drinks    Types: 4 Glasses of wine, 2 Standard drinks or equivalent per week    Comment: occ (currently none due to stomach issues 01/29/21)   Drug use: No   Sexual activity: Not Currently    Partners: Male    Birth control/protection: Surgical    Comment: Ablation  Other Topics Concern   Not on file  Social History Narrative   Not on file   Social Determinants of Health   Financial Resource  Strain: Not on file  Food Insecurity: Not on file  Transportation Needs: Not on file  Physical Activity: Not on file  Stress: Not on file  Social Connections: Not on file  Intimate Partner Violence: Not on file    Physical Exam: Today's Vitals   04/21/21 0844  BP: 128/86  Pulse: 77  Resp: 15  Temp: 98.1 F (36.7 C)  TempSrc: Oral  SpO2: 97%  Weight: 56.7 kg  Height: 5\' 4"  (1.626 m)  PainSc: 0-No pain   Body mass index is 21.46 kg/m. GEN: NAD EYE: Sclerae anicteric ENT: MMM CV: Non-tachycardic GI: Soft, NT/ND NEURO:  Alert & Oriented x 3  Lab Results: No results for input(s): WBC, HGB, HCT, PLT in the last 72 hours. BMET No results for input(s): NA, K, CL, CO2, GLUCOSE, BUN, CREATININE, CALCIUM in the last 72 hours. LFT No results for input(s): PROT, ALBUMIN, AST, ALT, ALKPHOS, BILITOT, BILIDIR, IBILI in the last 72 hours. PT/INR No results for input(s): LABPROT, INR in the last 72 hours.   Impression / Plan: This is a 60 y.o.female who presents for EGD for duodenal stricture dilation.  Last dilation up to 10 mm will hopefully get up to 12 to 13.5 mm today.  The risks and benefits of endoscopic evaluation/treatment were discussed with the patient and/or family; these include but are not limited to the risk of perforation, infection, bleeding, missed lesions, lack of diagnosis, severe illness requiring hospitalization, as well as anesthesia and sedation related illnesses.  The patient's history has been reviewed, patient examined, no change in status, and deemed stable for procedure.  The patient and/or family is agreeable to proceed.    46, MD [facility] # [place]

## 2021-04-21 NOTE — Discharge Instructions (Signed)
YOU HAD AN ENDOSCOPIC PROCEDURE TODAY: Refer to the procedure report and other information in the discharge instructions given to you for any specific questions about what was found during the examination. If this information does not answer your questions, please call [place] office at [phone] to clarify.   YOU SHOULD EXPECT: Some feelings of bloating in the abdomen. Passage of more gas than usual. Walking can help get rid of the air that was put into your GI tract during the procedure and reduce the bloating. If you had a lower endoscopy (such as a colonoscopy or flexible sigmoidoscopy) you may notice spotting of blood in your stool or on the toilet paper. Some abdominal soreness may be present for a day or two, also.  DIET: Your first meal following the procedure should be a light meal and then it is ok to progress to your normal diet. A half-sandwich or bowl of soup is an example of a good first meal. Heavy or fried foods are harder to digest and may make you feel nauseous or bloated. Drink plenty of fluids but you should avoid alcoholic beverages for 24 hours. If you had a esophageal dilation, please see attached instructions for diet.    ACTIVITY: Your care partner should take you home directly after the procedure. You should plan to take it easy, moving slowly for the rest of the day. You can resume normal activity the day after the procedure however YOU SHOULD NOT DRIVE, use power tools, machinery or perform tasks that involve climbing or major physical exertion for 24 hours (because of the sedation medicines used during the test).   SYMPTOMS TO REPORT IMMEDIATELY: A gastroenterologist can be reached at any hour. Please call [phone]  for any of the following symptoms:   Following upper endoscopy (EGD, EUS, ERCP, esophageal dilation) Vomiting of blood or coffee ground material  New, significant abdominal pain  New, significant chest pain or pain under the shoulder blades  Painful or  persistently difficult swallowing  New shortness of breath  Black, tarry-looking or red, bloody stools  FOLLOW UP:  If any biopsies were taken you will be contacted by phone or by letter within the next 1-3 weeks. Call [phone]  if you have not heard about the biopsies in 3 weeks.  Please also call with any specific questions about appointments or follow up tests.  

## 2021-04-21 NOTE — Telephone Encounter (Signed)
-----   Message from Lemar Lofty., MD sent at 04/21/2021  9:54 AM EST ----- Regarding: Follow-up EGD Patricia Hart, This patient needs a repeat EGD with dilation in the next 3 to 4 weeks.  45-minute case with fluoroscopy available.  Thanks. GM

## 2021-04-21 NOTE — Anesthesia Postprocedure Evaluation (Signed)
Anesthesia Post Note  Patient: Patricia Hart  Procedure(s) Performed: ESOPHAGOGASTRODUODENOSCOPY (EGD) WITH PROPOFOL BALLOON DILATION     Patient location during evaluation: Endoscopy Anesthesia Type: MAC Level of consciousness: awake and alert Pain management: pain level controlled Vital Signs Assessment: post-procedure vital signs reviewed and stable Respiratory status: spontaneous breathing, nonlabored ventilation and respiratory function stable Cardiovascular status: stable and blood pressure returned to baseline Postop Assessment: no apparent nausea or vomiting Anesthetic complications: no   No notable events documented.  Last Vitals:  Vitals:   04/21/21 0950 04/21/21 0959  BP: (!) 159/91 (!) 142/97  Pulse: 72   Resp: 15   Temp:    SpO2: 98%     Last Pain:  Vitals:   04/21/21 0959  TempSrc:   PainSc: 2                  Ali Mohl,W. EDMOND

## 2021-04-22 ENCOUNTER — Other Ambulatory Visit: Payer: Self-pay

## 2021-04-22 DIAGNOSIS — R1310 Dysphagia, unspecified: Secondary | ICD-10-CM

## 2021-04-22 NOTE — Telephone Encounter (Signed)
The pt has been scheduled for EGD Dil on 05/16/21 at 730 am at [facility] with GM  EGD Dil  scheduled, pt instructed and medications reviewed.  Patient instructions mailed to home.  Patient to call with any questions or concerns.

## 2021-04-23 ENCOUNTER — Encounter (HOSPITAL_COMMUNITY): Payer: Self-pay | Admitting: Gastroenterology

## 2021-05-08 ENCOUNTER — Encounter: Payer: Self-pay | Admitting: Gastroenterology

## 2021-05-09 ENCOUNTER — Other Ambulatory Visit: Payer: Self-pay

## 2021-05-09 MED ORDER — SUCRALFATE 1 G PO TABS: 1.0000 g | ORAL_TABLET | Freq: Three times a day (TID) | ORAL | 6 refills | Status: DC

## 2021-05-15 ENCOUNTER — Encounter (HOSPITAL_COMMUNITY): Payer: Self-pay | Admitting: Gastroenterology

## 2021-05-15 NOTE — Anesthesia Preprocedure Evaluation (Addendum)
Anesthesia Evaluation  Patient identified by MRN, date of birth, ID band Patient awake    Reviewed: Allergy & Precautions, NPO status , Patient's Chart, lab work & pertinent test results  Airway Mallampati: I  TM Distance: >3 FB Neck ROM: Full    Dental no notable dental hx. (+) Teeth Intact, Dental Advisory Given   Pulmonary neg pulmonary ROS,    Pulmonary exam normal breath sounds clear to auscultation       Cardiovascular negative cardio ROS Normal cardiovascular exam Rhythm:Regular Rate:Normal     Neuro/Psych  Neuromuscular disease negative psych ROS   GI/Hepatic Neg liver ROS, GERD  Medicated,Dysphagia Epigastric abdominal pain Hx/o Esophageal stricture   Endo/Other  negative endocrine ROS  Renal/GU negative Renal ROS  negative genitourinary   Musculoskeletal  (+) Arthritis , Osteoarthritis,  Fibromyalgia -  Abdominal   Peds  Hematology negative hematology ROS (+)   Anesthesia Other Findings   Reproductive/Obstetrics                            Anesthesia Physical Anesthesia Plan  ASA: 2  Anesthesia Plan: MAC   Post-op Pain Management: Minimal or no pain anticipated   Induction: Intravenous  PONV Risk Score and Plan: 2 and Treatment may vary due to age or medical condition and Propofol infusion  Airway Management Planned: Natural Airway and Simple Face Mask  Additional Equipment:   Intra-op Plan:   Post-operative Plan:   Informed Consent: I have reviewed the patients History and Physical, chart, labs and discussed the procedure including the risks, benefits and alternatives for the proposed anesthesia with the patient or authorized representative who has indicated his/her understanding and acceptance.     Dental advisory given  Plan Discussed with: CRNA and Anesthesiologist  Anesthesia Plan Comments:        Anesthesia Quick Evaluation

## 2021-05-16 ENCOUNTER — Encounter (HOSPITAL_COMMUNITY)
Admission: RE | Disposition: A | Discharge status: Home / Self Care | Payer: Self-pay | Source: Home / Self Care | Attending: Gastroenterology

## 2021-05-16 ENCOUNTER — Other Ambulatory Visit: Payer: Self-pay

## 2021-05-16 ENCOUNTER — Ambulatory Visit (HOSPITAL_COMMUNITY): Payer: BC Managed Care – PPO | Admitting: Anesthesiology

## 2021-05-16 ENCOUNTER — Ambulatory Visit (HOSPITAL_COMMUNITY)
Admission: RE | Admit: 2021-05-16 | Discharge: 2021-05-16 | Disposition: A | Discharge status: Home / Self Care | Payer: BC Managed Care – PPO | Attending: Gastroenterology | Admitting: Gastroenterology

## 2021-05-16 ENCOUNTER — Encounter (HOSPITAL_COMMUNITY): Payer: Self-pay | Admitting: Gastroenterology

## 2021-05-16 ENCOUNTER — Telehealth: Payer: Self-pay

## 2021-05-16 DIAGNOSIS — K315 Obstruction of duodenum: Secondary | ICD-10-CM | POA: Diagnosis not present

## 2021-05-16 DIAGNOSIS — R1084 Generalized abdominal pain: Secondary | ICD-10-CM | POA: Diagnosis not present

## 2021-05-16 DIAGNOSIS — Z800 Family history of malignant neoplasm of digestive organs: Secondary | ICD-10-CM | POA: Diagnosis not present

## 2021-05-16 DIAGNOSIS — R140 Abdominal distension (gaseous): Secondary | ICD-10-CM | POA: Insufficient documentation

## 2021-05-16 DIAGNOSIS — K449 Diaphragmatic hernia without obstruction or gangrene: Secondary | ICD-10-CM | POA: Insufficient documentation

## 2021-05-16 DIAGNOSIS — Z86711 Personal history of pulmonary embolism: Secondary | ICD-10-CM | POA: Diagnosis not present

## 2021-05-16 DIAGNOSIS — K269 Duodenal ulcer, unspecified as acute or chronic, without hemorrhage or perforation: Secondary | ICD-10-CM | POA: Insufficient documentation

## 2021-05-16 DIAGNOSIS — K219 Gastro-esophageal reflux disease without esophagitis: Secondary | ICD-10-CM | POA: Insufficient documentation

## 2021-05-16 DIAGNOSIS — R1310 Dysphagia, unspecified: Secondary | ICD-10-CM

## 2021-05-16 HISTORY — PX: ESOPHAGOGASTRODUODENOSCOPY (EGD) WITH PROPOFOL: SHX5813

## 2021-05-16 SURGERY — ESOPHAGOGASTRODUODENOSCOPY (EGD) WITH PROPOFOL
Anesthesia: Monitor Anesthesia Care

## 2021-05-16 MED ORDER — LIDOCAINE HCL (CARDIAC) PF 100 MG/5ML IV SOSY
PREFILLED_SYRINGE | INTRAVENOUS | Status: DC | PRN
  Administered 2021-05-16: 75 mg via INTRAVENOUS

## 2021-05-16 MED ORDER — FENTANYL CITRATE (PF) 100 MCG/2ML IJ SOLN
INTRAMUSCULAR | Status: DC | PRN
  Administered 2021-05-16: 50 ug via INTRAVENOUS

## 2021-05-16 MED ORDER — ONDANSETRON HCL 4 MG/2ML IJ SOLN
INTRAMUSCULAR | Status: DC | PRN
  Administered 2021-05-16: 4 mg via INTRAVENOUS

## 2021-05-16 MED ORDER — SODIUM CHLORIDE 0.9 % IV SOLN: INTRAVENOUS | Status: DC

## 2021-05-16 MED ORDER — LACTATED RINGERS IV SOLN: INTRAVENOUS | Status: DC | PRN

## 2021-05-16 MED ORDER — PROPOFOL 500 MG/50ML IV EMUL
INTRAVENOUS | Status: DC | PRN
  Administered 2021-05-16: 400 ug/kg/min via INTRAVENOUS

## 2021-05-16 MED ORDER — PANTOPRAZOLE SODIUM 40 MG PO TBEC: 40.0000 mg | DELAYED_RELEASE_TABLET | Freq: Two times a day (BID) | ORAL | 12 refills | Status: DC

## 2021-05-16 MED ORDER — SUCRALFATE 1 G PO TABS: 1.0000 g | ORAL_TABLET | Freq: Three times a day (TID) | ORAL | 12 refills | Status: DC

## 2021-05-16 MED ORDER — FENTANYL CITRATE (PF) 100 MCG/2ML IJ SOLN
INTRAMUSCULAR | Status: AC
  Filled 2021-05-16: qty 2

## 2021-05-16 MED ORDER — PROPOFOL 1000 MG/100ML IV EMUL
INTRAVENOUS | Status: AC
  Filled 2021-05-16: qty 100

## 2021-05-16 MED ORDER — LACTATED RINGERS IV SOLN: INTRAVENOUS | Status: DC

## 2021-05-16 MED ORDER — DEXAMETHASONE SODIUM PHOSPHATE 10 MG/ML IJ SOLN
INTRAMUSCULAR | Status: DC | PRN
  Administered 2021-05-16: 10 mg via INTRAVENOUS

## 2021-05-16 SURGICAL SUPPLY — 15 items

## 2021-05-16 NOTE — Transfer of Care (Signed)
Immediate Anesthesia Transfer of Care Note  Patient: Patricia Hart  Procedure(s) Performed: ESOPHAGOGASTRODUODENOSCOPY (EGD) WITH PROPOFOL Balloon dilation wire-guided  Patient Location: PACU  Anesthesia Type:MAC  Level of Consciousness: awake, alert , oriented and patient cooperative  Airway & Oxygen Therapy: Patient Spontanous Breathing and Patient connected to face mask oxygen  Post-op Assessment: Report given to RN, Post -op Vital signs reviewed and stable and Patient moving all extremities X 4  Post vital signs: Reviewed and stable  Last Vitals:  Vitals Value Taken Time  BP    Temp    Pulse    Resp    SpO2      Last Pain:  Vitals:   05/16/21 0652  TempSrc: Oral  PainSc: 3          Complications: No notable events documented.

## 2021-05-16 NOTE — Anesthesia Procedure Notes (Signed)
Procedure Name: MAC Date/Time: 05/16/2021 7:31 AM Performed by: Lissa Morales, CRNA Pre-anesthesia Checklist: Patient identified, Emergency Drugs available and Suction available Patient Re-evaluated:Patient Re-evaluated prior to induction Oxygen Delivery Method: Simple face mask Preoxygenation: Pre-oxygenation with 100% oxygen Placement Confirmation: positive ETCO2 ETT to lip (cm): POM mask.

## 2021-05-16 NOTE — Telephone Encounter (Signed)
-----   Message from Lemar Lofty., MD sent at 05/16/2021  8:54 AM EST ----- Regarding: Repeat EGD Please schedule EGD with fluoroscopy and potential AXIOS stent with my next available time slot in the hospital-based setting.  Thanks. GM

## 2021-05-16 NOTE — Op Note (Signed)
[facility] Patient Name: Patricia Hart Procedure Date: 05/16/2021 MRN: [id number] Attending MD: Justice Britain , MD Date of Birth: June 22, 1960 CSN: [id number] Age: 60 Admit Type: Outpatient Procedure:                Upper GI endoscopy Indications:              Generalized abdominal pain, Stenosis of the                            duodenum, Follow-up of duodenal stenosis, Abdominal                            bloating Providers:                Justice Britain, MD, Carlyn Reichert, RN, Cherylynn Ridges, Technician, Enrigue Catena, CRNA Referring MD:              Medicines:                Monitored Anesthesia Care Complications:            No immediate complications. Estimated Blood Loss:     Estimated blood loss was minimal. Procedure:                Pre-Anesthesia Assessment:                           - Prior to the procedure, a History and Physical                            was performed, and patient medications and                            allergies were reviewed. The patient's tolerance of                            previous anesthesia was also reviewed. The risks                            and benefits of the procedure and the sedation                            options and risks were discussed with the patient.                            All questions were answered, and informed consent                            was obtained. Prior Anticoagulants: The patient has                            taken no previous anticoagulant or antiplatelet                            agents. ASA  Grade Assessment: III - A patient with                            severe systemic disease. After reviewing the risks                            and benefits, the patient was deemed in                            satisfactory condition to undergo the procedure.                           After obtaining informed consent, the endoscope was                             passed under direct vision. Throughout the                            procedure, the patient's blood pressure, pulse, and                            oxygen saturations were monitored continuously. The                            [id number]-H190 ([id number]) Olympus endoscope was introduced                            through the mouth, and advanced to the second part                            of duodenum. The upper GI endoscopy was                            accomplished without difficulty. The patient                            tolerated the procedure. Scope In: Scope Out: Findings:      No gross lesions were noted in the entire esophagus.      The Z-line was regular and was found 33 cm from the incisors.      A 3 cm hiatal hernia was present.      A small amount of food (residue) was found in the entire examined       stomach. Suction via Endoscope was performed.      No other gross lesions were noted in the entire examined stomach.      One non-bleeding cratered duodenal ulcer was found in the D1/D2 sweep.       The lesion was 15 mm in largest dimension - similar in appearance to       last procedure.      An acquired benign-appearing, intrinsic moderate stenosis was found just       distal to the ulcer in the D1/D2 sweep and was not able to be traversed       initially with the adult endoscope but was traversed after dilation. A  TTS dilator was passed through the scope. Dilation with a 03-05-11 mm       and a 12-13.5-15 mm pyloric balloon dilator was performed up to a       maximum of 13.5 mm (there was significant resistance at that point). The       dilation site was examined following endoscope reinsertion and showed       moderate mucosal disruption, moderate improvement in luminal narrowing       and no perforation.      No gross lesions were noted in the second portion of the duodenum. Impression:               - No gross lesions in esophagus. Z-line regular, 33                             cm from the incisors.                           - 3 cm hiatal hernia.                           - A small amount of food (residue) in the stomach -                            suctioned via endoscope.                           - No other gross lesions in the stomach.                           - Non-bleeding duodenal ulcer in D1/D2 sweep.                           - Just distal to this ulcer, there was an acquired                            duodenal stenosis that was dilated up to 13.5 mm.                            Traversal into the distal duodenum was much easier                            thereafter.                           - No gross lesions in the second portion of the                            duodenum. Moderate Sedation:      Not Applicable - Patient had care per Anesthesia. Recommendation:           - The patient will be observed post-procedure,                            until all discharge criteria are met.                           -  Discharge patient to home.                           - Patient has a contact number available for                            emergencies. The signs and symptoms of potential                            delayed complications were discussed with the                            patient. Return to normal activities tomorrow.                            Written discharge instructions were provided to the                            patient.                           - Resume previous diet.                           - Continue present medications.                           - Repeat upper endoscopy PRN for retreatment.                           - The findings and recommendations were discussed                            with the patient. Procedure Code(s):        --- Professional ---                           [phone], Esophagogastroduodenoscopy, flexible,                            transoral; with dilation of gastric/duodenal                             stricture(s) (eg, balloon, bougie) Diagnosis Code(s):        --- Professional ---                           K44.9, Diaphragmatic hernia without obstruction or                            gangrene                           K26.9, Duodenal ulcer, unspecified as acute or                            chronic, without hemorrhage or perforation  K31.5, Obstruction of duodenum                           R10.84, Generalized abdominal pain                           R14.0, Abdominal distension (gaseous) CPT copyright 2019 [employer]. All rights reserved. The codes documented in this report are preliminary and upon coder review may  be revised to meet current compliance requirements. Justice Britain, MD 05/16/2021 8:24:10 AM Number of Addenda: 0

## 2021-05-16 NOTE — H&P (Signed)
GASTROENTEROLOGY PROCEDURE H&P NOTE   Primary Care Physician: Irena Reichmann, DO  HPI: Patricia Hart is a 60 y.o. female who presents for EGD with plan for duodenal dilation versus stenting for persistent abdominal pain and bloating.  Past Medical History:  Diagnosis Date   Arthritis    Blood transfusion without reported diagnosis 1997   Clotting disorder (HCC) 1997   pregnancy induced dvt- no problems since then   Fibromyalgia    GERD (gastroesophageal reflux disease)    History of DVT (deep vein thrombosis)    during pregnancy   History of pulmonary embolism    occurred during pregnancy   Overactive bladder    Past Surgical History:  Procedure Laterality Date   ABDOMINAL EXPOSURE N/A 02/19/2016   Procedure: ABDOMINAL EXPOSURE;  Surgeon: Larina Earthly, MD;  Location: [facility];  Service: Vascular;  Laterality: N/A;   ABLATION  2006   endometrial   ANTERIOR LUMBAR FUSION N/A 02/19/2016   Procedure: Anterior Lumbar Interbody Fusion  - Lumbar five-sacral one;  Surgeon: Tia Alert, MD;  Location: [facility];  Service: Neurosurgery;  Laterality: N/A;   BACK SURGERY     BALLOON DILATION N/A 03/17/2021   Procedure: BALLOON DILATION;  Surgeon: Mansouraty, Netty Starring., MD;  Location: [facility];  Service: Gastroenterology;  Laterality: N/A;   BALLOON DILATION N/A 04/21/2021   Procedure: BALLOON DILATION;  Surgeon: Meridee Score Netty Starring., MD;  Location: [facility];  Service: Gastroenterology;  Laterality: N/A;   BIOPSY  03/17/2021   Procedure: BIOPSY;  Surgeon: Meridee Score Netty Starring., MD;  Location: [facility];  Service: Gastroenterology;;   breast implants     saline   CARPAL TUNNEL RELEASE Right 04/23/2015   Procedure: RIGHT CARPAL TUNNEL RELEASE;  Surgeon: Cindee Salt, MD;  Location: [facility];  Service: Orthopedics;  Laterality: Right;   CARPAL TUNNEL RELEASE Left 05/16/2015   Procedure: LEFT CARPAL TUNNEL RELEASE;  Surgeon: Cindee Salt, MD;   Location: [facility];  Service: Orthopedics;  Laterality: Left;   ESOPHAGOGASTRODUODENOSCOPY (EGD) WITH PROPOFOL N/A 03/17/2021   Procedure: ESOPHAGOGASTRODUODENOSCOPY (EGD) WITH PROPOFOL;  Surgeon: Meridee Score Netty Starring., MD;  Location: [facility];  Service: Gastroenterology;  Laterality: N/A;  Fluoro   ESOPHAGOGASTRODUODENOSCOPY (EGD) WITH PROPOFOL N/A 04/21/2021   Procedure: ESOPHAGOGASTRODUODENOSCOPY (EGD) WITH PROPOFOL;  Surgeon: Meridee Score Netty Starring., MD;  Location: [facility];  Service: Gastroenterology;  Laterality: N/A;  fluoro   TONSILLECTOMY     TRIGGER FINGER RELEASE Right 04/23/2015   Procedure: RELEASE TRIGGER FINGER/A-1 PULLEY RIGHT THUMB;  Surgeon: Cindee Salt, MD;  Location:  SURGERY CENTER;  Service: Orthopedics;  Laterality: Right;   WISDOM TOOTH EXTRACTION     Current Facility-Administered Medications  Medication Dose Route Frequency Provider Last Rate Last Admin   0.9 %  sodium chloride infusion   Intravenous Continuous Mansouraty, Netty Starring., MD       lactated ringers infusion   Intravenous Continuous Mansouraty, Netty Starring., MD 10 mL/hr at 05/16/21 0704 New Bag at 05/16/21 0704    Current Facility-Administered Medications:    0.9 %  sodium chloride infusion, , Intravenous, Continuous, Mansouraty, Netty Starring., MD   lactated ringers infusion, , Intravenous, Continuous, Mansouraty, Netty Starring., MD, Last Rate: 10 mL/hr at 05/16/21 0704, New Bag at 05/16/21 0704 Allergies  Allergen Reactions   Latex Other (See Comments)    Skin irritation   Family History  Problem Relation Age of Onset   Colon polyps Mother    Hyperlipidemia Mother  Colon cancer Mother        in her 24s or 82s   Osteoporosis Mother    Lung cancer Mother    Diabetes Father    Hyperlipidemia Father    Hypertension Father    Breast cancer Maternal Aunt        61's   Pancreatic cancer Neg Hx    Stomach cancer Neg Hx    Liver disease Neg Hx    Inflammatory bowel  disease Neg Hx    Esophageal cancer Neg Hx    Social History   Socioeconomic History   Marital status: Divorced    Spouse name: Not on file   Number of children: Not on file   Years of education: Not on file   Highest education level: Not on file  Occupational History   Not on file  Tobacco Use   Smoking status: Never   Smokeless tobacco: Never  Vaping Use   Vaping Use: Never used  Substance and Sexual Activity   Alcohol use: Yes    Alcohol/week: 6.0 standard drinks    Types: 4 Glasses of wine, 2 Standard drinks or equivalent per week    Comment: occ (currently none due to stomach issues 01/29/21)   Drug use: No   Sexual activity: Not Currently    Partners: Male    Birth control/protection: Surgical    Comment: Ablation  Other Topics Concern   Not on file  Social History Narrative   Not on file   Social Determinants of Health   Financial Resource Strain: Not on file  Food Insecurity: Not on file  Transportation Needs: Not on file  Physical Activity: Not on file  Stress: Not on file  Social Connections: Not on file  Intimate Partner Violence: Not on file    Physical Exam: Today's Vitals   05/16/21 0652  BP: 118/75  Pulse: 79  Resp: (!) 22  Temp: 98.1 F (36.7 C)  TempSrc: Oral  SpO2: 97%  Weight: 59 kg  Height: 5\' 4"  (1.626 m)  PainSc: 3    Body mass index is 22.31 kg/m. GEN: NAD EYE: Sclerae anicteric ENT: MMM CV: Non-tachycardic GI: Soft, NT/ND NEURO:  Alert & Oriented x 3  Lab Results: No results for input(s): WBC, HGB, HCT, PLT in the last 72 hours. BMET No results for input(s): NA, K, CL, CO2, GLUCOSE, BUN, CREATININE, CALCIUM in the last 72 hours. LFT No results for input(s): PROT, ALBUMIN, AST, ALT, ALKPHOS, BILITOT, BILIDIR, IBILI in the last 72 hours. PT/INR No results for input(s): LABPROT, INR in the last 72 hours.   Impression / Plan: This is a 60 y.o.female who presents for EGD with plan for duodenal dilation versus stenting for  persistent abdominal pain and bloating.  The risks and benefits of endoscopic evaluation/treatment were discussed with the patient and/or family; these include but are not limited to the risk of perforation, infection, bleeding, missed lesions, lack of diagnosis, severe illness requiring hospitalization, as well as anesthesia and sedation related illnesses.  The patient's history has been reviewed, patient examined, no change in status, and deemed stable for procedure.  The patient and/or family is agreeable to proceed.   We did discuss the possibility of using an AXIOS cold stent of 15 or 20 mm in an effort of trying to have a more durable response to this significant stricturing of the duodenum.  There is a risk that the stents can become dislodged and lead to displacement into the small bowel.  Most often this will traverse into the colon but sometimes can lead to small bowel obstruction.  If the patient has significant ulceration still present this may preclude me from being able to consider this.  We will make a decision as to whether balloon dilation for stenting will make a difference for the patient.  Time will tell after we see what is seen.  Patient agrees to what ever is felt to be in her best interests.   Corliss Parish, MD [facility] Office # [phone]

## 2021-05-16 NOTE — Discharge Instructions (Signed)
YOU HAD AN ENDOSCOPIC PROCEDURE TODAY: Refer to the procedure report and other information in the discharge instructions given to you for any specific questions about what was found during the examination. If this information does not answer your questions, please call [place] office at [phone] to clarify.  ° °YOU SHOULD EXPECT: Some feelings of bloating in the abdomen. Passage of more gas than usual. Walking can help get rid of the air that was put into your GI tract during the procedure and reduce the bloating. If you had a lower endoscopy (such as a colonoscopy or flexible sigmoidoscopy) you may notice spotting of blood in your stool or on the toilet paper. Some abdominal soreness may be present for a day or two, also. ° °DIET: Your first meal following the procedure should be a light meal and then it is ok to progress to your normal diet. A half-sandwich or bowl of soup is an example of a good first meal. Heavy or fried foods are harder to digest and may make you feel nauseous or bloated. Drink plenty of fluids but you should avoid alcoholic beverages for 24 hours. If you had a esophageal dilation, please see attached instructions for diet.   ° °ACTIVITY: Your care partner should take you home directly after the procedure. You should plan to take it easy, moving slowly for the rest of the day. You can resume normal activity the day after the procedure however YOU SHOULD NOT DRIVE, use power tools, machinery or perform tasks that involve climbing or major physical exertion for 24 hours (because of the sedation medicines used during the test).  ° °SYMPTOMS TO REPORT IMMEDIATELY: °A gastroenterologist can be reached at any hour. Please call [phone]  for any of the following symptoms:  °Following lower endoscopy (colonoscopy, flexible sigmoidoscopy) °Excessive amounts of blood in the stool  °Significant tenderness, worsening of abdominal pains  °Swelling of the abdomen that is new, acute  °Fever of 100° or  higher  °Following upper endoscopy (EGD, EUS, ERCP, esophageal dilation) °Vomiting of blood or coffee ground material  °New, significant abdominal pain  °New, significant chest pain or pain under the shoulder blades  °Painful or persistently difficult swallowing  °New shortness of breath  °Black, tarry-looking or red, bloody stools ° °FOLLOW UP:  °If any biopsies were taken you will be contacted by phone or by letter within the next 1-3 weeks. Call [phone]  if you have not heard about the biopsies in 3 weeks.  °Please also call with any specific questions about appointments or follow up tests. ° °

## 2021-05-16 NOTE — Anesthesia Postprocedure Evaluation (Signed)
Anesthesia Post Note  Patient: Patricia Hart  Procedure(s) Performed: ESOPHAGOGASTRODUODENOSCOPY (EGD) WITH PROPOFOL Balloon dilation wire-guided     Patient location during evaluation: PACU Anesthesia Type: MAC Level of consciousness: awake and alert Pain management: pain level controlled Vital Signs Assessment: post-procedure vital signs reviewed and stable Respiratory status: spontaneous breathing, nonlabored ventilation and respiratory function stable Cardiovascular status: stable and blood pressure returned to baseline Postop Assessment: no apparent nausea or vomiting Anesthetic complications: no   No notable events documented.  Last Vitals:  Vitals:   05/16/21 0830 05/16/21 0840  BP: (!) 104/59 120/64  Pulse: 80 78  Resp: 20 15  Temp:    SpO2: 97% 97%    Last Pain:  Vitals:   05/16/21 0840  TempSrc:   PainSc: 0-No pain                 Trinaty Bundrick A.

## 2021-05-20 ENCOUNTER — Other Ambulatory Visit: Payer: Self-pay

## 2021-05-20 DIAGNOSIS — K315 Obstruction of duodenum: Secondary | ICD-10-CM

## 2021-05-20 NOTE — Telephone Encounter (Signed)
EGD Axios stent scheduled for 07/16/21 at 10 am with GM

## 2021-05-20 NOTE — Telephone Encounter (Signed)
EGD scheduled, pt instructed and medications reviewed.  Patient instructions mailed to home and sent to My Chart .  Patient to call with any questions or concerns. ? ?

## 2021-05-21 ENCOUNTER — Encounter (HOSPITAL_COMMUNITY): Payer: Self-pay | Admitting: Gastroenterology

## 2021-07-08 ENCOUNTER — Encounter (HOSPITAL_COMMUNITY): Payer: Self-pay | Admitting: Gastroenterology

## 2021-07-08 NOTE — Progress Notes (Signed)
Attempted to obtain medical history via telephone, unable to reach at this time. I left a voicemail to return pre surgical testing department's phone call.  

## 2021-07-16 ENCOUNTER — Ambulatory Visit (HOSPITAL_COMMUNITY): Payer: BC Managed Care – PPO | Admitting: Registered Nurse

## 2021-07-16 ENCOUNTER — Ambulatory Visit (HOSPITAL_COMMUNITY): Payer: BC Managed Care – PPO

## 2021-07-16 ENCOUNTER — Observation Stay (HOSPITAL_COMMUNITY): Payer: BC Managed Care – PPO

## 2021-07-16 ENCOUNTER — Encounter (HOSPITAL_COMMUNITY)
Admission: AD | Disposition: A | Discharge status: Home / Self Care | Payer: Self-pay | Source: Home / Self Care | Attending: Internal Medicine

## 2021-07-16 ENCOUNTER — Other Ambulatory Visit: Payer: Self-pay

## 2021-07-16 ENCOUNTER — Inpatient Hospital Stay (HOSPITAL_COMMUNITY)
Admission: AD | Admit: 2021-07-16 | Discharge: 2021-07-19 | DRG: 381 | Disposition: A | Discharge status: Home / Self Care | Payer: BC Managed Care – PPO | Attending: Internal Medicine | Admitting: Internal Medicine

## 2021-07-16 ENCOUNTER — Encounter (HOSPITAL_COMMUNITY): Payer: Self-pay | Admitting: Gastroenterology

## 2021-07-16 DIAGNOSIS — Z803 Family history of malignant neoplasm of breast: Secondary | ICD-10-CM

## 2021-07-16 DIAGNOSIS — K219 Gastro-esophageal reflux disease without esophagitis: Secondary | ICD-10-CM | POA: Diagnosis present

## 2021-07-16 DIAGNOSIS — Z83438 Family history of other disorder of lipoprotein metabolism and other lipidemia: Secondary | ICD-10-CM

## 2021-07-16 DIAGNOSIS — Z86711 Personal history of pulmonary embolism: Secondary | ICD-10-CM

## 2021-07-16 DIAGNOSIS — Z86718 Personal history of other venous thrombosis and embolism: Secondary | ICD-10-CM

## 2021-07-16 DIAGNOSIS — K760 Fatty (change of) liver, not elsewhere classified: Secondary | ICD-10-CM

## 2021-07-16 DIAGNOSIS — K315 Obstruction of duodenum: Principal | ICD-10-CM | POA: Diagnosis present

## 2021-07-16 DIAGNOSIS — T183XXA Foreign body in small intestine, initial encounter: Secondary | ICD-10-CM | POA: Diagnosis not present

## 2021-07-16 DIAGNOSIS — R109 Unspecified abdominal pain: Secondary | ICD-10-CM | POA: Diagnosis not present

## 2021-07-16 DIAGNOSIS — Z8249 Family history of ischemic heart disease and other diseases of the circulatory system: Secondary | ICD-10-CM

## 2021-07-16 DIAGNOSIS — Z91040 Latex allergy status: Secondary | ICD-10-CM

## 2021-07-16 DIAGNOSIS — R1110 Vomiting, unspecified: Secondary | ICD-10-CM

## 2021-07-16 DIAGNOSIS — T182XXA Foreign body in stomach, initial encounter: Secondary | ICD-10-CM | POA: Diagnosis not present

## 2021-07-16 DIAGNOSIS — I2510 Atherosclerotic heart disease of native coronary artery without angina pectoris: Secondary | ICD-10-CM | POA: Diagnosis not present

## 2021-07-16 DIAGNOSIS — Z833 Family history of diabetes mellitus: Secondary | ICD-10-CM

## 2021-07-16 DIAGNOSIS — R112 Nausea with vomiting, unspecified: Secondary | ICD-10-CM | POA: Diagnosis present

## 2021-07-16 DIAGNOSIS — K631 Perforation of intestine (nontraumatic): Secondary | ICD-10-CM

## 2021-07-16 DIAGNOSIS — Z8262 Family history of osteoporosis: Secondary | ICD-10-CM

## 2021-07-16 DIAGNOSIS — E871 Hypo-osmolality and hyponatremia: Secondary | ICD-10-CM | POA: Diagnosis present

## 2021-07-16 DIAGNOSIS — E876 Hypokalemia: Secondary | ICD-10-CM | POA: Diagnosis present

## 2021-07-16 DIAGNOSIS — R739 Hyperglycemia, unspecified: Secondary | ICD-10-CM | POA: Diagnosis present

## 2021-07-16 DIAGNOSIS — D72829 Elevated white blood cell count, unspecified: Secondary | ICD-10-CM | POA: Diagnosis present

## 2021-07-16 DIAGNOSIS — K222 Esophageal obstruction: Secondary | ICD-10-CM

## 2021-07-16 DIAGNOSIS — M797 Fibromyalgia: Secondary | ICD-10-CM | POA: Diagnosis present

## 2021-07-16 DIAGNOSIS — R110 Nausea: Secondary | ICD-10-CM | POA: Diagnosis not present

## 2021-07-16 DIAGNOSIS — K3189 Other diseases of stomach and duodenum: Secondary | ICD-10-CM | POA: Diagnosis not present

## 2021-07-16 DIAGNOSIS — R7401 Elevation of levels of liver transaminase levels: Secondary | ICD-10-CM | POA: Diagnosis present

## 2021-07-16 DIAGNOSIS — K449 Diaphragmatic hernia without obstruction or gangrene: Secondary | ICD-10-CM | POA: Diagnosis not present

## 2021-07-16 DIAGNOSIS — Z800 Family history of malignant neoplasm of digestive organs: Secondary | ICD-10-CM

## 2021-07-16 DIAGNOSIS — T18128A Food in esophagus causing other injury, initial encounter: Secondary | ICD-10-CM

## 2021-07-16 DIAGNOSIS — X58XXXA Exposure to other specified factors, initial encounter: Secondary | ICD-10-CM | POA: Diagnosis present

## 2021-07-16 DIAGNOSIS — Z981 Arthrodesis status: Secondary | ICD-10-CM

## 2021-07-16 DIAGNOSIS — R1010 Upper abdominal pain, unspecified: Secondary | ICD-10-CM | POA: Diagnosis not present

## 2021-07-16 DIAGNOSIS — Z8371 Family history of colonic polyps: Secondary | ICD-10-CM

## 2021-07-16 DIAGNOSIS — Z801 Family history of malignant neoplasm of trachea, bronchus and lung: Secondary | ICD-10-CM

## 2021-07-16 HISTORY — PX: ESOPHAGOGASTRODUODENOSCOPY (EGD) WITH PROPOFOL: SHX5813

## 2021-07-16 HISTORY — DX: Fatty (change of) liver, not elsewhere classified: K76.0

## 2021-07-16 HISTORY — PX: COLONIC STENT PLACEMENT: SHX5542

## 2021-07-16 HISTORY — PX: FOREIGN BODY REMOVAL: SHX962

## 2021-07-16 LAB — COMPREHENSIVE METABOLIC PANEL
ALT: 55 U/L — ABNORMAL HIGH (ref 0–44)
AST: 32 U/L (ref 15–41)
Albumin: 4.4 g/dL (ref 3.5–5.0)
Alkaline Phosphatase: 56 U/L (ref 38–126)
Anion gap: 10 (ref 5–15)
BUN: 19 mg/dL (ref 6–20)
CO2: 22 mmol/L (ref 22–32)
Calcium: 9.2 mg/dL (ref 8.9–10.3)
Chloride: 106 mmol/L (ref 98–111)
Creatinine, Ser: 0.84 mg/dL (ref 0.44–1.00)
GFR, Estimated: >60 mL/min (ref >60)
Glucose, Bld: 140 mg/dL — ABNORMAL HIGH (ref 70–99)
Potassium: 4.2 mmol/L (ref 3.5–5.1)
Sodium: 138 mmol/L (ref 135–145)
Total Bilirubin: 0.1 mg/dL — ABNORMAL LOW (ref 0.3–1.2)
Total Protein: 7.4 g/dL (ref 6.5–8.1)

## 2021-07-16 LAB — LIPASE, BLOOD: Lipase: 30 U/L (ref 11–51)

## 2021-07-16 LAB — CBC
HCT: 39.7 % (ref 36.0–46.0)
Hemoglobin: 14 g/dL (ref 12.0–15.0)
MCH: 31.9 pg (ref 26.0–34.0)
MCHC: 35.3 g/dL (ref 30.0–36.0)
MCV: 90.4 fL (ref 80.0–100.0)
Platelets: 267 10*3/uL (ref 150–400)
RBC: 4.39 MIL/uL (ref 3.87–5.11)
RDW: 13.1 % (ref 11.5–15.5)
WBC: 11 10*3/uL — ABNORMAL HIGH (ref 4.0–10.5)
nRBC: 0 % (ref 0.0–0.2)

## 2021-07-16 LAB — PHOSPHORUS: Phosphorus: 4 mg/dL (ref 2.5–4.6)

## 2021-07-16 LAB — MAGNESIUM: Magnesium: 1.8 mg/dL (ref 1.7–2.4)

## 2021-07-16 SURGERY — ESOPHAGOGASTRODUODENOSCOPY (EGD) WITH PROPOFOL
Anesthesia: Monitor Anesthesia Care

## 2021-07-16 MED ORDER — SUCRALFATE 1 G PO TABS: 1.0000 g | ORAL_TABLET | Freq: Three times a day (TID) | ORAL | 11 refills | Status: AC

## 2021-07-16 MED ORDER — SODIUM CHLORIDE 0.9 % IV BOLUS: 500.0000 mL | Freq: Once | INTRAVENOUS | Status: DC

## 2021-07-16 MED ORDER — FENTANYL CITRATE (PF) 100 MCG/2ML IJ SOLN
INTRAMUSCULAR | Status: AC
  Filled 2021-07-16: qty 2

## 2021-07-16 MED ORDER — ONDANSETRON HCL 4 MG/2ML IJ SOLN
4.0000 mg | Freq: Once | INTRAMUSCULAR | Status: AC | PRN
  Administered 2021-07-16: 4 mg via INTRAVENOUS
  Filled 2021-07-16: qty 2

## 2021-07-16 MED ORDER — ONDANSETRON HCL 4 MG PO TABS
4.0000 mg | ORAL_TABLET | Freq: Four times a day (QID) | ORAL | Status: DC | PRN
  Filled 2021-07-16: qty 1

## 2021-07-16 MED ORDER — SODIUM CHLORIDE 0.9 % IV SOLN: INTRAVENOUS | Status: DC

## 2021-07-16 MED ORDER — IOHEXOL 300 MG/ML  SOLN
100.0000 mL | Freq: Once | INTRAMUSCULAR | Status: AC | PRN
  Administered 2021-07-16: 100 mL via INTRAVENOUS

## 2021-07-16 MED ORDER — FENTANYL CITRATE (PF) 100 MCG/2ML IJ SOLN
50.0000 ug | Freq: Once | INTRAMUSCULAR | Status: AC
Start: 2021-07-16 — End: 2021-07-16
  Administered 2021-07-16: 50 ug via INTRAVENOUS

## 2021-07-16 MED ORDER — ACETAMINOPHEN 10 MG/ML IV SOLN
1000.0000 mg | Freq: Once | INTRAVENOUS | Status: AC
  Administered 2021-07-16: 1000 mg via INTRAVENOUS
  Filled 2021-07-16: qty 100

## 2021-07-16 MED ORDER — PANTOPRAZOLE SODIUM 40 MG IV SOLR
40.0000 mg | Freq: Two times a day (BID) | INTRAVENOUS | Status: DC
  Administered 2021-07-16 – 2021-07-19 (×7): 40 mg via INTRAVENOUS
  Filled 2021-07-16 (×7): qty 10

## 2021-07-16 MED ORDER — ONDANSETRON HCL 4 MG/2ML IJ SOLN
INTRAMUSCULAR | Status: DC | PRN
  Administered 2021-07-16: 4 mg via INTRAVENOUS

## 2021-07-16 MED ORDER — PROPOFOL 500 MG/50ML IV EMUL
INTRAVENOUS | Status: DC | PRN
  Administered 2021-07-16: 200 ug/kg/min via INTRAVENOUS

## 2021-07-16 MED ORDER — METOCLOPRAMIDE HCL 5 MG/ML IJ SOLN
INTRAMUSCULAR | Status: AC
  Filled 2021-07-16: qty 2

## 2021-07-16 MED ORDER — AMISULPRIDE (ANTIEMETIC) 5 MG/2ML IV SOLN
10.0000 mg | Freq: Once | INTRAVENOUS | Status: AC
  Administered 2021-07-16: 10 mg via INTRAVENOUS
  Filled 2021-07-16: qty 4

## 2021-07-16 MED ORDER — FENTANYL CITRATE (PF) 100 MCG/2ML IJ SOLN
INTRAMUSCULAR | Status: AC
Start: 2021-07-16 — End: ?
  Filled 2021-07-16: qty 2

## 2021-07-16 MED ORDER — ONDANSETRON HCL 4 MG/2ML IJ SOLN
4.0000 mg | Freq: Four times a day (QID) | INTRAMUSCULAR | Status: DC | PRN
  Administered 2021-07-16 – 2021-07-17 (×2): 4 mg via INTRAVENOUS
  Filled 2021-07-16 (×2): qty 2

## 2021-07-16 MED ORDER — LACTATED RINGERS IV SOLN: INTRAVENOUS | Status: DC

## 2021-07-16 MED ORDER — ACETAMINOPHEN 325 MG PO TABS
650.0000 mg | ORAL_TABLET | Freq: Four times a day (QID) | ORAL | Status: DC | PRN
  Administered 2021-07-17 – 2021-07-19 (×2): 650 mg via ORAL
  Filled 2021-07-16 (×3): qty 2

## 2021-07-16 MED ORDER — ONDANSETRON HCL 4 MG/2ML IJ SOLN
4.0000 mg | Freq: Once | INTRAMUSCULAR | Status: AC
  Administered 2021-07-16: 4 mg via INTRAVENOUS

## 2021-07-16 MED ORDER — PROPOFOL 10 MG/ML IV BOLUS
INTRAVENOUS | Status: DC | PRN
  Administered 2021-07-16: 60 mg via INTRAVENOUS

## 2021-07-16 MED ORDER — LACTATED RINGERS IV BOLUS
1000.0000 mL | Freq: Once | INTRAVENOUS | Status: AC
  Administered 2021-07-16: 1000 mL via INTRAVENOUS

## 2021-07-16 MED ORDER — ACETAMINOPHEN 650 MG RE SUPP
650.0000 mg | Freq: Four times a day (QID) | RECTAL | Status: DC | PRN
  Administered 2021-07-16: 650 mg via RECTAL
  Filled 2021-07-16 (×2): qty 1

## 2021-07-16 MED ORDER — ONDANSETRON HCL 4 MG/2ML IJ SOLN
INTRAMUSCULAR | Status: AC
  Filled 2021-07-16: qty 2

## 2021-07-16 MED ORDER — AMISULPRIDE (ANTIEMETIC) 5 MG/2ML IV SOLN: 10.0000 mg | Freq: Once | INTRAVENOUS | Status: DC | PRN

## 2021-07-16 MED ORDER — FENTANYL CITRATE (PF) 100 MCG/2ML IJ SOLN
25.0000 ug | Freq: Once | INTRAMUSCULAR | Status: AC
  Administered 2021-07-16: 25 ug via INTRAVENOUS

## 2021-07-16 MED ORDER — LIDOCAINE 2% (20 MG/ML) 5 ML SYRINGE
INTRAMUSCULAR | Status: DC | PRN
Start: 2021-07-16 — End: 2021-07-16
  Administered 2021-07-16: 60 mg via INTRAVENOUS

## 2021-07-16 MED ORDER — DEXTROSE 50 % IV SOLN: 25.0000 g | INTRAVENOUS | Status: DC | PRN

## 2021-07-16 MED ORDER — PROPOFOL 1000 MG/100ML IV EMUL
INTRAVENOUS | Status: AC
  Filled 2021-07-16: qty 100

## 2021-07-16 MED ORDER — SODIUM CHLORIDE 0.9 % IV SOLN
12.5000 mg | Freq: Once | INTRAVENOUS | Status: AC
  Administered 2021-07-16: 12.5 mg via INTRAVENOUS
  Filled 2021-07-16: qty 12.5

## 2021-07-16 MED ORDER — METOCLOPRAMIDE HCL 5 MG/ML IJ SOLN
10.0000 mg | Freq: Four times a day (QID) | INTRAMUSCULAR | Status: DC
  Administered 2021-07-16 – 2021-07-18 (×7): 10 mg via INTRAVENOUS
  Filled 2021-07-16 (×6): qty 2

## 2021-07-16 MED ORDER — PANTOPRAZOLE SODIUM 40 MG PO TBEC: 40.0000 mg | DELAYED_RELEASE_TABLET | Freq: Two times a day (BID) | ORAL | 11 refills | Status: DC

## 2021-07-16 SURGICAL SUPPLY — 15 items

## 2021-07-16 NOTE — Anesthesia Preprocedure Evaluation (Addendum)
Anesthesia Evaluation  Patient identified by MRN, date of birth, ID band Patient awake    Reviewed: Allergy & Precautions, NPO status , Patient's Chart, lab work & pertinent test results  Airway Mallampati: II  TM Distance: >3 FB Neck ROM: Full    Dental  (+) Teeth Intact, Dental Advisory Given   Pulmonary neg pulmonary ROS,    breath sounds clear to auscultation       Cardiovascular negative cardio ROS   Rhythm:Regular Rate:Normal     Neuro/Psych  Neuromuscular disease negative psych ROS   GI/Hepatic Neg liver ROS, GERD  Medicated,  Endo/Other  negative endocrine ROS  Renal/GU negative Renal ROS     Musculoskeletal  (+) Arthritis , Fibromyalgia -  Abdominal Normal abdominal exam  (+)   Peds  Hematology negative hematology ROS (+)   Anesthesia Other Findings   Reproductive/Obstetrics                            Anesthesia Physical Anesthesia Plan  ASA: 2  Anesthesia Plan: MAC   Post-op Pain Management:    Induction: Intravenous  PONV Risk Score and Plan: 0 and Propofol infusion  Airway Management Planned: Natural Airway and Simple Face Mask  Additional Equipment: None  Intra-op Plan:   Post-operative Plan:   Informed Consent: I have reviewed the patients History and Physical, chart, labs and discussed the procedure including the risks, benefits and alternatives for the proposed anesthesia with the patient or authorized representative who has indicated his/her understanding and acceptance.       Plan Discussed with: CRNA  Anesthesia Plan Comments:        Anesthesia Quick Evaluation

## 2021-07-16 NOTE — Progress Notes (Addendum)
11 AM note I evaluated the patient after her endoscopy with AXIOS stent placement. She has been having/experiencing significant nausea.  She has had issues with abdominal pain that have come and gone although now that she has been retching and vomiting she is having some more significant discomfort. I have subsequently ordered a chest x-ray and KUB to rule out a perforation. Our anesthesia colleagues will be administering a another antiemetic. We will reevaluate to ensure no evidence of complication post procedure.  I suspect this is a result of the stenosis feeling a constant pressure but if she cannot tolerate it we will have to make a decision as to whether we bring her in for observation or repeat endoscopy and remove the stent.  Time will tell.  Corliss Parish, MD [facility] Office # [phone]   11:45 AM note I reevaluated the patient, she continues to experience nausea even after antiemetic being administered via anesthesia.  Some slight abdominal discomfort as well.  I am going to order her a single dose of 25 mcg of fentanyl and an additional 4 mg of Zofran. I am awaiting the chest x-ray and KUB results but my wet read does not suggest any abnormality.  I have called and updated patient's father who is her point-of-care/transportation today and he is thankful for the follow-up.   Corliss Parish, MD [facility] Office # [phone]    12:15 PM addendum note  Patient continues to struggle with nausea.  25 mcg of fentanyl did help with pain somewhat and her discomfort is only rated as a 3-4 out of 10.  She feels that this discomfort is more result of the retching that she is experiencing.  We discussed that at this point either we need to remove the stent via endoscopy and see how she does post procedure versus trying to keep her in the hospital try to manage the nausea with antiemetics and see if we can get her  through this for at least the next 24 hours where things hopefully will decrease in time the degree of nausea and vomiting.  By coming into the hospital, we would also order a cross-sectional CT abdomen to define and ensure that we see no evidence of a free perforation into the retroperitoneum, felt to be much less likely, but still something to think about/consider.  After speaking with the patient further we are going to plan to try to bring her into the hospital.  I am going to go ahead and obtain some basic laboratories for observation and also go ahead and order the noncontrasted CT abdomen.  As long as there is no evidence of a perforation, then aggressive antiemetics such as Compazine Phenergan Zofran scopolamine can be considered in an effort of trying to keep her nausea and emesis at bay.  If by tomorrow she has not been able to tolerate an improvement in her nausea/vomiting, then we will likely need to perform repeat endoscopy and remove the AXIOS stent.  I will update the inpatient GI team and we will be very happy to have and follow with this patient.  I appreciate the opportunity to speak with our medicine colleagues to see if they will be able to bring her in under their service since she is going to potentially require antiemetics staggering and monitoring.  I am awaiting [facility] admissions response.   Corliss Parish, MD [facility] Office # [phone]   12:25 PM addendum Will give 12.5 mg Phenergan. CT  abdomen with IV contrast but no oral contrast has been ordered. Laboratories have been ordered and my endoscopy nursing team will work on obtaining as stat labs.   Corliss Parish, MD [facility] Office # [phone]

## 2021-07-16 NOTE — Transfer of Care (Signed)
Immediate Anesthesia Transfer of Care Note  Patient: Patricia Hart  Procedure(s) Performed: ESOPHAGOGASTRODUODENOSCOPY (EGD) WITH PROPOFOL FOREIGN BODY REMOVAL AXIOS STENT PLACEMENT  Patient Location: PACU  Anesthesia Type:MAC  Level of Consciousness: awake, alert  and oriented  Airway & Oxygen Therapy: Patient Spontanous Breathing and Patient connected to face mask oxygen  Post-op Assessment: Report given to RN and Post -op Vital signs reviewed and stable  Post vital signs: Reviewed and stable  Last Vitals:  Vitals Value Taken Time  BP    Temp    Pulse    Resp    SpO2      Last Pain:  Vitals:   07/16/21 0912  TempSrc: Oral  PainSc: 0-No pain         Complications: No notable events documented.

## 2021-07-16 NOTE — Op Note (Signed)
[facility] Patient Name: Patricia Hart Procedure Date: 07/16/2021 MRN: [id number] Attending MD: Justice Britain , MD Date of Birth: 1960-11-30 CSN: [id number] Age: 61 Admit Type: Outpatient Procedure:                Upper GI endoscopy Indications:              Stenosis of the duodenum, Follow-up of duodenal                            stenosis, For dilation and stenting of duodenal                            stenosis Providers:                Justice Britain, MD, Doristine Johns, RN,                            Luan Moore, Technician, Marla Roe, CRNA Referring MD:             Janie Morning Medicines:                Monitored Anesthesia Care Complications:            No immediate complications. Estimated Blood Loss:     Estimated blood loss was minimal. Procedure:                Pre-Anesthesia Assessment:                           - Prior to the procedure, a History and Physical                            was performed, and patient medications and                            allergies were reviewed. The patient's tolerance of                            previous anesthesia was also reviewed. The risks                            and benefits of the procedure and the sedation                            options and risks were discussed with the patient.                            All questions were answered, and informed consent                            was obtained. Prior Anticoagulants: The patient has                            taken no previous anticoagulant or antiplatelet  agents. ASA Grade Assessment: III - A patient with                            severe systemic disease. After reviewing the risks                            and benefits, the patient was deemed in                            satisfactory condition to undergo the procedure.                           After obtaining informed consent, the endoscope was                             passed under direct vision. Throughout the                            procedure, the patient's blood pressure, pulse, and                            oxygen saturations were monitored continuously. The                            [id number] ([id number] Olympus therapeutic endoscope                            was introduced through the mouth, and advanced to                            the second part of duodenum. The upper GI endoscopy                            was accomplished without difficulty. The patient                            tolerated the procedure. Scope In: Scope Out: Findings:      No gross lesions were noted in the entire esophagus.      The Z-line was irregular and was found 33 cm from the incisors.      A 3 cm hiatal hernia was present.      A small amount of food (residue) was found in the entire examined       stomach.      No other gross lesions were noted in the entire examined stomach.      Food (residue) was found in the duodenal bulb. Removal of food was       accomplished with a roth net.      An acquired benign-appearing, intrinsic severe stenosis was found in the       D1/D2 angle. The previous ulcer seems to have healed proximal to this       area. I could not traverse this area with the therapeutic endoscope. The       stenosis was approximately 6-7 mm in diameter. The width of this  stenosis was felt to be slightly greater than 1 cm. It could not be       traversed with our scope initially. Due to the length and the stenosis       having recurred so quickly (2 months since last procedure), I decided an       attempt at AXIOS stenting could be of help for this refractory       stricture. This area was carefully stented with an AXIOS LAMS 15 mm x 15       mm stent.      After the placement of the AXIOS stent, the endoscope could see into the       D2 area and no gross lesions were noted. Impression:               - No gross lesions in  esophagus. Z-line irregular,                            33 cm from the incisors.                           - 3 cm hiatal hernia.                           - A small amount of food (residue) in the stomach.                            No other gross lesions in the stomach.                           - Retained food in the duodenum. Removal was                            successful.                           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 The                            stenosis has recurred quicly. The previous ulcer                            seems to have healed however. AXIOS 15 mm x 15 mm                            stent placed successfully.                           - No gross lesions in the visualized second portion                            of the duodenum. Moderate Sedation:      Not Applicable - Patient had care per Anesthesia. Recommendation:           - The patient will be observed post-procedure,  until all discharge criteria are met.                           - Discharge patient to home.                           - Patient has a contact number available for                            emergencies. The signs and symptoms of potential                            delayed complications were discussed with the                            patient. Return to normal activities tomorrow.                            Written discharge instructions were provided to the                            patient.                           - Clear liquid diet for 24 hours. Then full liquid                            diet for 1-2 days. If tolerating then may advance                            diet to a low-residue diet.                           - Continue PPI twice daily.                           - If patient can tolerate Carafate four times daily                            for 1 month that will be helpful and then may                            decrease to  twice daily.                           - Observe patient's clinical course.                           - If patient has severe pain or development of                            nausea and vomiting post procedure in the coming  weeks, we need to know immediately and get imaging                            as she could have stent dislodgement and potential                            for obstruction development.                           - Repeat upper endoscopy in 6 weeks to remove                            stent..                           - The findings and recommendations were discussed                            with the patient. Procedure Code(s):        --- Professional ---                           [phone], Esophagogastroduodenoscopy, flexible,                            transoral; with placement of endoscopic stent                            (includes pre- and post-dilation and guide wire                            passage, when performed)                           43247, Esophagogastroduodenoscopy, flexible,                            transoral; with removal of foreign body(s) Diagnosis Code(s):        --- Professional ---                           K22.8, Other specified diseases of esophagus                           K44.9, Diaphragmatic hernia without obstruction or                            gangrene                           T18.2XXA, Foreign body in stomach, initial encounter                           T18.3XXA, Foreign body in small intestine, initial                            encounter  K31.5, Obstruction of duodenum CPT copyright 2019 [employer]. All rights reserved. The codes documented in this report are preliminary and upon coder review may  be revised to meet current compliance requirements. Justice Britain, MD 07/16/2021 11:01:47 AM Number of Addenda: 0

## 2021-07-16 NOTE — Progress Notes (Signed)
Brief GI progress note  CT abdomen reviewed and there is no evidence of a duodenal or retroperitoneal perforation.  This was what I expected.  We have tentatively set the patient up for an EGD on 2/23 if her symptoms of nausea and vomiting are not able to be controlled or improve with medication therapy.  I updated the patient's father about the results of the CT scan.  The inpatient GI team will reevaluate the patient into tomorrow morning and then decide about potential stent removal (I hope that is not the case but if necessary then it should occur).

## 2021-07-16 NOTE — H&P (Signed)
GASTROENTEROLOGY PROCEDURE H&P NOTE   Primary Care Physician: Irena Reichmann, DO  HPI: Patricia Hart is a 61 y.o. female who presents for EGD with dilation for duodenal stricture vs AXIOS cold stenting stricture.  Past Medical History:  Diagnosis Date   Arthritis    Blood transfusion without reported diagnosis 1997   Clotting disorder (HCC) 1997   pregnancy induced dvt- no problems since then   Fibromyalgia    GERD (gastroesophageal reflux disease)    History of DVT (deep vein thrombosis)    during pregnancy   History of pulmonary embolism    occurred during pregnancy   Overactive bladder    Past Surgical History:  Procedure Laterality Date   ABDOMINAL EXPOSURE N/A 02/19/2016   Procedure: ABDOMINAL EXPOSURE;  Surgeon: Larina Earthly, MD;  Location: [facility];  Service: Vascular;  Laterality: N/A;   ABLATION  2006   endometrial   ANTERIOR LUMBAR FUSION N/A 02/19/2016   Procedure: Anterior Lumbar Interbody Fusion  - Lumbar five-sacral one;  Surgeon: Tia Alert, MD;  Location: [facility];  Service: Neurosurgery;  Laterality: N/A;   BACK SURGERY     BALLOON DILATION N/A 03/17/2021   Procedure: BALLOON DILATION;  Surgeon: Mansouraty, Netty Starring., MD;  Location: [facility];  Service: Gastroenterology;  Laterality: N/A;   BALLOON DILATION N/A 04/21/2021   Procedure: BALLOON DILATION;  Surgeon: Meridee Score Netty Starring., MD;  Location: [facility];  Service: Gastroenterology;  Laterality: N/A;   BIOPSY  03/17/2021   Procedure: BIOPSY;  Surgeon: Meridee Score Netty Starring., MD;  Location: [facility];  Service: Gastroenterology;;   breast implants     saline   CARPAL TUNNEL RELEASE Right 04/23/2015   Procedure: RIGHT CARPAL TUNNEL RELEASE;  Surgeon: Cindee Salt, MD;  Location: [facility];  Service: Orthopedics;  Laterality: Right;   CARPAL TUNNEL RELEASE Left 05/16/2015   Procedure: LEFT CARPAL TUNNEL RELEASE;  Surgeon: Cindee Salt, MD;  Location: [facility];  Service: Orthopedics;  Laterality: Left;   ESOPHAGOGASTRODUODENOSCOPY (EGD) WITH PROPOFOL N/A 03/17/2021   Procedure: ESOPHAGOGASTRODUODENOSCOPY (EGD) WITH PROPOFOL;  Surgeon: Meridee Score Netty Starring., MD;  Location: [facility];  Service: Gastroenterology;  Laterality: N/A;  Fluoro   ESOPHAGOGASTRODUODENOSCOPY (EGD) WITH PROPOFOL N/A 04/21/2021   Procedure: ESOPHAGOGASTRODUODENOSCOPY (EGD) WITH PROPOFOL;  Surgeon: Meridee Score Netty Starring., MD;  Location: [facility];  Service: Gastroenterology;  Laterality: N/A;  fluoro   ESOPHAGOGASTRODUODENOSCOPY (EGD) WITH PROPOFOL N/A 05/16/2021   Procedure: ESOPHAGOGASTRODUODENOSCOPY (EGD) WITH PROPOFOL;  Surgeon: Meridee Score Netty Starring., MD;  Location: [facility];  Service: Gastroenterology;  Laterality: N/A;   TONSILLECTOMY     TRIGGER FINGER RELEASE Right 04/23/2015   Procedure: RELEASE TRIGGER FINGER/A-1 PULLEY RIGHT THUMB;  Surgeon: Cindee Salt, MD;  Location:  SURGERY CENTER;  Service: Orthopedics;  Laterality: Right;   WISDOM TOOTH EXTRACTION     Current Facility-Administered Medications  Medication Dose Route Frequency Provider Last Rate Last Admin   0.9 %  sodium chloride infusion   Intravenous Continuous Mansouraty, Netty Starring., MD       lactated ringers infusion   Intravenous Continuous Mansouraty, Netty Starring., MD 10 mL/hr at 07/16/21 0920 New Bag at 07/16/21 0920    Current Facility-Administered Medications:    0.9 %  sodium chloride infusion, , Intravenous, Continuous, Mansouraty, Netty Starring., MD   lactated ringers infusion, , Intravenous, Continuous, Mansouraty, Netty Starring., MD, Last Rate: 10 mL/hr at 07/16/21 0920, New Bag at 07/16/21 0920 Allergies  Allergen Reactions   Latex Other (  See Comments)    Skin irritation   Family History  Problem Relation Age of Onset   Colon polyps Mother    Hyperlipidemia Mother    Colon cancer Mother        in her 67s or 41s   Osteoporosis Mother    Lung cancer Mother     Diabetes Father    Hyperlipidemia Father    Hypertension Father    Breast cancer Maternal Aunt        64's   Pancreatic cancer Neg Hx    Stomach cancer Neg Hx    Liver disease Neg Hx    Inflammatory bowel disease Neg Hx    Esophageal cancer Neg Hx    Social History   Socioeconomic History   Marital status: Divorced    Spouse name: Not on file   Number of children: Not on file   Years of education: Not on file   Highest education level: Not on file  Occupational History   Not on file  Tobacco Use   Smoking status: Never   Smokeless tobacco: Never  Vaping Use   Vaping Use: Never used  Substance and Sexual Activity   Alcohol use: Yes    Alcohol/week: 6.0 standard drinks    Types: 4 Glasses of wine, 2 Standard drinks or equivalent per week    Comment: occ (currently none due to stomach issues 01/29/21)   Drug use: No   Sexual activity: Not Currently    Partners: Male    Birth control/protection: Surgical    Comment: Ablation  Other Topics Concern   Not on file  Social History Narrative   Not on file   Social Determinants of Health   Financial Resource Strain: Not on file  Food Insecurity: Not on file  Transportation Needs: Not on file  Physical Activity: Not on file  Stress: Not on file  Social Connections: Not on file  Intimate Partner Violence: Not on file    Physical Exam: Today's Vitals   07/16/21 0912  BP: (!) 121/101  Pulse: 83  Resp: 17  Temp: 97.9 F (36.6 C)  TempSrc: Oral  Weight: 59 kg  Height: 5\' 4"  (1.626 m)  PainSc: 0-No pain   Body mass index is 22.31 kg/m. GEN: NAD EYE: Sclerae anicteric ENT: MMM CV: Non-tachycardic GI: Soft, NT/ND NEURO:  Alert & Oriented x 3  Lab Results: No results for input(s): WBC, HGB, HCT, PLT in the last 72 hours. BMET No results for input(s): NA, K, CL, CO2, GLUCOSE, BUN, CREATININE, CALCIUM in the last 72 hours. LFT No results for input(s): PROT, ALBUMIN, AST, ALT, ALKPHOS, BILITOT, BILIDIR, IBILI in  the last 72 hours. PT/INR No results for input(s): LABPROT, INR in the last 72 hours.   Impression / Plan: This is a 61 y.o.female who presents for EGD with dilation for duodenal stricture vs AXIOS cold stenting stricture.  The risks and benefits of endoscopic evaluation/treatment were discussed with the patient and/or family; these include but are not limited to the risk of perforation, infection, bleeding, missed lesions, lack of diagnosis, severe illness requiring hospitalization, as well as anesthesia and sedation related illnesses.  The patient's history has been reviewed, patient examined, no change in status, and deemed stable for procedure.  The patient and/or family is agreeable to proceed.    46, MD [facility] # [place]

## 2021-07-16 NOTE — Progress Notes (Signed)
Pt arrived to 1508 via stretcher. Ambulated to bathroom independtently.   VSS . Pt c/o nausea without relief. Nausea administered . MD Robb Matar notified for other PRN med if possible.

## 2021-07-16 NOTE — Anesthesia Postprocedure Evaluation (Signed)
Anesthesia Post Note  Patient: Patricia Hart  Procedure(s) Performed: ESOPHAGOGASTRODUODENOSCOPY (EGD) WITH PROPOFOL FOREIGN BODY REMOVAL AXIOS [place] PLACEMENT     Patient location during evaluation: PACU Anesthesia Type: MAC Level of consciousness: awake and alert Pain management: pain level controlled Vital Signs Assessment: post-procedure vital signs reviewed and stable Respiratory status: spontaneous breathing, nonlabored ventilation, respiratory function stable and patient connected to nasal cannula oxygen Cardiovascular status: stable and blood pressure returned to baseline Anesthetic complications: no Comments: Pt complaint of nausea, medications administered. Mansouraty, MD believes the nausea is directly related to the stent and the nausea with hopefully resolve with time.    No notable events documented.  Last Vitals:  Vitals:   07/16/21 1140 07/16/21 1150  BP: (!) 166/82   Pulse: 69 62  Resp: 16 15  Temp:    SpO2: 98% 99%    Last Pain:  Vitals:   07/16/21 1139  TempSrc:   PainSc: Patricia Hart

## 2021-07-16 NOTE — Addendum Note (Signed)
Addendum  created 07/16/21 1243 by Shelton Silvas, MD   Order list changed, Pharmacy for encounter modified

## 2021-07-16 NOTE — Progress Notes (Signed)
Pt presented to Endoscopy recovery area s/p EGD with Axios stent placement. Pt reported severe nausea and 4/10 abdominal pain. Soon after, pt began to vomit bilious material. MD Mansouraty and MDA Hollis notified. MD Mansouraty ordered STAT portable chest and KUB. MDA Hollis gave verbal order for 10 mg Barhemsys IV push (see MAR). Report given to Carolynn Comment, RN.  Debarah Crape, RN 07/16/21 10:53AM

## 2021-07-16 NOTE — Discharge Instructions (Signed)
YOU HAD AN ENDOSCOPIC PROCEDURE TODAY: Refer to the procedure report and other information in the discharge instructions given to you for any specific questions about what was found during the examination. If this information does not answer your questions, please call [place] office at [phone] to clarify.  ° °YOU SHOULD EXPECT: Some feelings of bloating in the abdomen. Passage of more gas than usual. Walking can help get rid of the air that was put into your GI tract during the procedure and reduce the bloating.. ° °DIET: Your first meal following the procedure should be a light meal and then it is ok to progress to your normal diet. A half-sandwich or bowl of soup is an example of a good first meal. Heavy or fried foods are harder to digest and may make you feel nauseous or bloated. Drink plenty of fluids but you should avoid alcoholic beverages for 24 hours. If you had a esophageal dilation, please see attached instructions for diet.   ° °ACTIVITY: Your care partner should take you home directly after the procedure. You should plan to take it easy, moving slowly for the rest of the day. You can resume normal activity the day after the procedure however YOU SHOULD NOT DRIVE, use power tools, machinery or perform tasks that involve climbing or major physical exertion for 24 hours (because of the sedation medicines used during the test).  ° °SYMPTOMS TO REPORT IMMEDIATELY: °A gastroenterologist can be reached at any hour. Please call [phone]  for any of the following symptoms:  ° °Following upper endoscopy (EGD, EUS, ERCP, esophageal dilation) °Vomiting of blood or coffee ground material  °New, significant abdominal pain  °New, significant chest pain or pain under the shoulder blades  °Painful or persistently difficult swallowing  °New shortness of breath  °Black, tarry-looking or red, bloody stools ° °FOLLOW UP:  °If any biopsies were taken you will be contacted by phone or by letter within the next 1-3  weeks. Call [phone]  if you have not heard about the biopsies in 3 weeks.  °Please also call with any specific questions about appointments or follow up tests. ° °

## 2021-07-16 NOTE — H&P (Signed)
History and Physical    Patient: Patricia Hart [id number] DOB: 1960-12-28 DOA: 07/16/2021 DOS: the patient was seen and examined on 07/16/2021 PCP: Janie Morning, DO  Patient coming from: Home  Chief Complaint: No chief complaint on file.   HPI: Raeanne Marsicano is a 61 y.o. female with medical history significant of osteoarthritis, history of pregnancy-induced DVT, fibromyalgia, GERD, history of PE, overactive bladder, duodenal stricture who underwent an EGD for dilatation and duodenal AXIOS stent placement which was followed by multiple episodes of nausea and emesis while in recovery in the endoscopic suite.  She is still complaining of pain and nausea when she was being examined despite administration of 2 different antiemetics.  No recent history of melena or hematochezia.  No flank pain, dysuria, frequency or hematuria.  No fever, chills, dyspnea, chest pain, palpitations, PND, orthopnea or pitting edema of the lower extremities.  No polyuria, polydipsia, polyphagia or blurred vision.  Endoscopic suite course: Initial vital signs were temperature 97.9 F, pulse 83, respirations 17, BP 120/101 mmHg O2 sat 100% on simple mask oxygen.  The patient received ondansetron 4 mg IVP x2 promethazine 12.5 mg IVPB, amisulpride 10 mg IVP and I added metoclopramide 10 mg IVP plus fentanyl 50 mcg IVP.  Lab work: Her CBC is her white count 11.0, hemoglobin 14.0 g/dL platelets 267.  CMP and chemistry showed a glucose of 140 mg/dL and ALT 55 units/L.  The rest of the CMP values, lipase, magnesium and phosphorus were unremarkable.  Imaging: Portable abdomen and abdominal x-ray did not show any acute abnormalities.  CT abdomen/pelvis with contrast showed duodenal stent in place without complicating features.  There were no acute abdominal findings, mass lesions or adenopathy.  There was diffuse and fairly marked fatty infiltration of the liver.  Please see images and full radiology report for further  details.  Review of Systems: As mentioned in the history of present illness. All other systems reviewed and are negative. Past Medical History:  Diagnosis Date   Arthritis    Blood transfusion without reported diagnosis 1997   Clotting disorder ([place]) 1997   pregnancy induced dvt- no problems since then   Fibromyalgia    GERD (gastroesophageal reflux disease)    History of DVT (deep vein thrombosis)    during pregnancy   History of pulmonary embolism    occurred during pregnancy   Overactive bladder    Past Surgical History:  Procedure Laterality Date   ABDOMINAL EXPOSURE N/A 02/19/2016   Procedure: ABDOMINAL EXPOSURE;  Surgeon: Rosetta Posner, MD;  Location: [facility];  Service: Vascular;  Laterality: N/A;   ABLATION  2006   endometrial   ANTERIOR LUMBAR FUSION N/A 02/19/2016   Procedure: Anterior Lumbar Interbody Fusion  - Lumbar five-sacral one;  Surgeon: Eustace Moore, MD;  Location: [facility];  Service: Neurosurgery;  Laterality: N/A;   BACK SURGERY     BALLOON DILATION N/A 03/17/2021   Procedure: BALLOON DILATION;  Surgeon: Mansouraty, Telford Nab., MD;  Location: [facility];  Service: Gastroenterology;  Laterality: N/A;   BALLOON DILATION N/A 04/21/2021   Procedure: BALLOON DILATION;  Surgeon: Rush Landmark Telford Nab., MD;  Location: [facility];  Service: Gastroenterology;  Laterality: N/A;   BIOPSY  03/17/2021   Procedure: BIOPSY;  Surgeon: Rush Landmark Telford Nab., MD;  Location: [facility];  Service: Gastroenterology;;   breast implants     saline   CARPAL TUNNEL RELEASE Right 04/23/2015   Procedure: RIGHT CARPAL TUNNEL RELEASE;  Surgeon: Daryll Brod, MD;  Location: [place];  Service: Orthopedics;  Laterality: Right;   CARPAL TUNNEL RELEASE Left 05/16/2015   Procedure: LEFT CARPAL TUNNEL RELEASE;  Surgeon: Daryll Brod, MD;  Location: [place];  Service: Orthopedics;  Laterality: Left;   ESOPHAGOGASTRODUODENOSCOPY (EGD) WITH PROPOFOL  N/A 03/17/2021   Procedure: ESOPHAGOGASTRODUODENOSCOPY (EGD) WITH PROPOFOL;  Surgeon: Rush Landmark Telford Nab., MD;  Location: [facility];  Service: Gastroenterology;  Laterality: N/A;  Fluoro   ESOPHAGOGASTRODUODENOSCOPY (EGD) WITH PROPOFOL N/A 04/21/2021   Procedure: ESOPHAGOGASTRODUODENOSCOPY (EGD) WITH PROPOFOL;  Surgeon: Rush Landmark Telford Nab., MD;  Location: [facility];  Service: Gastroenterology;  Laterality: N/A;  fluoro   ESOPHAGOGASTRODUODENOSCOPY (EGD) WITH PROPOFOL N/A 05/16/2021   Procedure: ESOPHAGOGASTRODUODENOSCOPY (EGD) WITH PROPOFOL;  Surgeon: Rush Landmark Telford Nab., MD;  Location: [facility];  Service: Gastroenterology;  Laterality: N/A;   TONSILLECTOMY     TRIGGER FINGER RELEASE Right 04/23/2015   Procedure: RELEASE TRIGGER FINGER/A-1 PULLEY RIGHT THUMB;  Surgeon: Daryll Brod, MD;  Location: [place];  Service: Orthopedics;  Laterality: Right;   WISDOM TOOTH EXTRACTION     Social History:  reports that she has never smoked. She has never used smokeless tobacco. She reports current alcohol use of about 6.0 standard drinks per week. She reports that she does not use drugs.  Allergies  Allergen Reactions   Latex Other (See Comments)    Skin irritation    Family History  Problem Relation Age of Onset   Colon polyps Mother    Hyperlipidemia Mother    Colon cancer Mother        in her 11s or 41s   Osteoporosis Mother    Lung cancer Mother    Diabetes Father    Hyperlipidemia Father    Hypertension Father    Breast cancer Maternal Aunt        50's   Pancreatic cancer Neg Hx    Stomach cancer Neg Hx    Liver disease Neg Hx    Inflammatory bowel disease Neg Hx    Esophageal cancer Neg Hx     Prior to Admission medications   Medication Sig Start Date End Date Taking? Authorizing Provider  acetaminophen (TYLENOL) 500 MG tablet Take 1,500 mg by mouth every 6 (six) hours as needed for mild pain.   Yes [provider]  b complex vitamins  capsule Take 1 capsule by mouth daily.   Yes [provider]  diclofenac Sodium (VOLTAREN) 1 % GEL Apply 1 application topically daily.   Yes [provider]  guaiFENesin (MUCINEX) 600 MG 12 hr tablet Take 600 mg by mouth daily as needed (Allergies).   Yes [provider]  Multiple Vitamins-Minerals (MULTIVITAMIN PO) Take 1 tablet by mouth daily.   Yes [provider]  pantoprazole (PROTONIX) 40 MG tablet Take 1 tablet (40 mg total) by mouth 2 (two) times daily before a meal. 07/16/21   Mansouraty, Telford Nab., MD  prochlorperazine (COMPAZINE) 25 MG suppository Place 1 suppository (25 mg total) rectally every 12 (twelve) hours as needed for nausea or vomiting. If oral anti-nausea medication is not helping Patient not taking: Reported on 07/11/2021 03/17/21   Mansouraty, Telford Nab., MD  promethazine (PHENERGAN) 12.5 MG tablet Take 1 tablet (12.5 mg total) by mouth every 6 (six) hours as needed for nausea or vomiting. Patient not taking: Reported on 07/11/2021 03/17/21   Mansouraty, Telford Nab., MD  sucralfate (CARAFATE) 1 g tablet Take 1 tablet (1 g total) by mouth 4 (four) times daily -  with meals and at bedtime. 07/16/21   Mansouraty, Telford Nab., MD    Physical Exam: Vitals:   07/16/21 1330 07/16/21 1340 07/16/21 1350 07/16/21 1410  BP: (!) 162/89 (!) 157/77 (!) 158/88 (!) 162/92  Pulse: 74 83 (!) 56 69  Resp: 18 (!) 27 17 (!) 22  Temp:      TempSrc:      SpO2: 96% 92% 92% 93%  Weight:      Height:       Physical Exam Vitals and nursing note reviewed.  HENT:     Head: Normocephalic and atraumatic.  Eyes:     Pupils: Pupils are equal, round, and reactive to light.  Cardiovascular:     Rate and Rhythm: Normal rate and regular rhythm.  Pulmonary:     Effort: Pulmonary effort is normal.     Breath sounds: Normal breath sounds.  Abdominal:     General: Abdomen is flat. There is no distension.     Palpations: Abdomen is soft.     Tenderness: There  is abdominal tenderness in the epigastric area. There is no guarding or rebound. Negative signs include Murphy's sign.  Musculoskeletal:     Cervical back: Neck supple.     Right lower leg: No edema.     Left lower leg: No edema.  Skin:    General: Skin is warm and dry.  Neurological:     General: No focal deficit present.     Mental Status: She is alert and oriented to person, place, and time.  Psychiatric:        Mood and Affect: Mood normal.     Data Reviewed:  There are no new results to review at this time.  Assessment and Plan: Principal Problem: Status post stenting of   Esophageal stricture Followed by   Intractable nausea and vomiting Observation/telemetry. Keep n.p.o. for now. Continue IV fluids. Metoclopramide 10 mg IVP every 6 hours. Continue Zofran as needed. Analgesics as needed. Pantoprazole 40 mg IVP twice daily.  Active Problems:   Hyperglycemia Check fasting glucose in AM. CBG monitoring every 6 hours while NPO.    Elevated ALT measurement   Hepatic asteatosis  Repeat LFTs in the morning.      Advance Care Planning:   Code Status: Full Code   Consults: Gastroenterology (Dr. Rush Landmark).  Family Communication:   Severity of Illness: The appropriate patient status for this patient is OBSERVATION. Observation status is judged to be reasonable and necessary in order to provide the required intensity of service to ensure the patient's safety. The patient's presenting symptoms, physical exam findings, and initial radiographic and laboratory data in the context of their medical condition is felt to place them at decreased risk for further clinical deterioration. Furthermore, it is anticipated that the patient will be medically stable for discharge from the hospital within 2 midnights of admission.   Author: Reubin Milan, MD 07/16/2021 2:34 PM  For on call review [email or web address].   This document was prepared using Dragon voice recognition  software and may contain some unintended transcription errors.

## 2021-07-17 ENCOUNTER — Encounter (HOSPITAL_COMMUNITY)
Admission: AD | Disposition: A | Discharge status: Home / Self Care | Payer: Self-pay | Source: Home / Self Care | Attending: Internal Medicine

## 2021-07-17 ENCOUNTER — Encounter (HOSPITAL_COMMUNITY): Payer: Self-pay | Admitting: Gastroenterology

## 2021-07-17 ENCOUNTER — Observation Stay (HOSPITAL_COMMUNITY): Payer: BC Managed Care – PPO | Admitting: Anesthesiology

## 2021-07-17 DIAGNOSIS — R7401 Elevation of levels of liver transaminase levels: Secondary | ICD-10-CM

## 2021-07-17 DIAGNOSIS — K315 Obstruction of duodenum: Secondary | ICD-10-CM | POA: Diagnosis not present

## 2021-07-17 DIAGNOSIS — R739 Hyperglycemia, unspecified: Secondary | ICD-10-CM | POA: Diagnosis not present

## 2021-07-17 DIAGNOSIS — R112 Nausea with vomiting, unspecified: Secondary | ICD-10-CM

## 2021-07-17 DIAGNOSIS — Z4659 Encounter for fitting and adjustment of other gastrointestinal appliance and device: Secondary | ICD-10-CM

## 2021-07-17 DIAGNOSIS — D72829 Elevated white blood cell count, unspecified: Secondary | ICD-10-CM

## 2021-07-17 DIAGNOSIS — E871 Hypo-osmolality and hyponatremia: Secondary | ICD-10-CM

## 2021-07-17 DIAGNOSIS — E876 Hypokalemia: Secondary | ICD-10-CM

## 2021-07-17 HISTORY — PX: STENT REMOVAL: SHX6421

## 2021-07-17 HISTORY — PX: ESOPHAGOGASTRODUODENOSCOPY: SHX5428

## 2021-07-17 LAB — COMPREHENSIVE METABOLIC PANEL
ALT: 47 U/L — ABNORMAL HIGH (ref 0–44)
AST: 30 U/L (ref 15–41)
Albumin: 4 g/dL (ref 3.5–5.0)
Alkaline Phosphatase: 48 U/L (ref 38–126)
Anion gap: 8 (ref 5–15)
BUN: 11 mg/dL (ref 6–20)
CO2: 23 mmol/L (ref 22–32)
Calcium: 8.5 mg/dL — ABNORMAL LOW (ref 8.9–10.3)
Chloride: 102 mmol/L (ref 98–111)
Creatinine, Ser: 0.71 mg/dL (ref 0.44–1.00)
GFR, Estimated: >60 mL/min (ref >60)
Glucose, Bld: 146 mg/dL — ABNORMAL HIGH (ref 70–99)
Potassium: 3.3 mmol/L — ABNORMAL LOW (ref 3.5–5.1)
Sodium: 133 mmol/L — ABNORMAL LOW (ref 135–145)
Total Bilirubin: 0.4 mg/dL (ref 0.3–1.2)
Total Protein: 7 g/dL (ref 6.5–8.1)

## 2021-07-17 LAB — GLUCOSE, CAPILLARY
Glucose-Capillary: 129 mg/dL — ABNORMAL HIGH (ref 70–99)
Glucose-Capillary: 142 mg/dL — ABNORMAL HIGH (ref 70–99)
Glucose-Capillary: 155 mg/dL — ABNORMAL HIGH (ref 70–99)
Glucose-Capillary: 177 mg/dL — ABNORMAL HIGH (ref 70–99)

## 2021-07-17 LAB — CBC
HCT: 37.1 % (ref 36.0–46.0)
Hemoglobin: 12.8 g/dL (ref 12.0–15.0)
MCH: 30.8 pg (ref 26.0–34.0)
MCHC: 34.5 g/dL (ref 30.0–36.0)
MCV: 89.4 fL (ref 80.0–100.0)
Platelets: 253 10*3/uL (ref 150–400)
RBC: 4.15 MIL/uL (ref 3.87–5.11)
RDW: 13 % (ref 11.5–15.5)
WBC: 11 10*3/uL — ABNORMAL HIGH (ref 4.0–10.5)
nRBC: 0 % (ref 0.0–0.2)

## 2021-07-17 LAB — HIV ANTIBODY (ROUTINE TESTING W REFLEX): HIV Screen 4th Generation wRfx: NONREACTIVE

## 2021-07-17 SURGERY — EGD (ESOPHAGOGASTRODUODENOSCOPY)
Anesthesia: General

## 2021-07-17 SURGERY — ESOPHAGOGASTRODUODENOSCOPY (EGD) WITH PROPOFOL
Anesthesia: Monitor Anesthesia Care

## 2021-07-17 MED ORDER — ONDANSETRON HCL 4 MG/2ML IJ SOLN
INTRAMUSCULAR | Status: AC
  Filled 2021-07-17: qty 2

## 2021-07-17 MED ORDER — ONDANSETRON HCL 4 MG/2ML IJ SOLN
4.0000 mg | Freq: Four times a day (QID) | INTRAMUSCULAR | Status: DC | PRN
Start: 2021-07-17 — End: 2021-07-18
  Administered 2021-07-18: 4 mg via INTRAVENOUS
  Filled 2021-07-17: qty 2

## 2021-07-17 MED ORDER — DEXAMETHASONE SODIUM PHOSPHATE 4 MG/ML IJ SOLN
INTRAMUSCULAR | Status: DC | PRN
  Administered 2021-07-17: 10 mg via INTRAVENOUS

## 2021-07-17 MED ORDER — POTASSIUM CHLORIDE CRYS ER 20 MEQ PO TBCR
40.0000 meq | EXTENDED_RELEASE_TABLET | Freq: Once | ORAL | Status: AC
Start: 2021-07-17 — End: 2021-07-17
  Administered 2021-07-17: 40 meq via ORAL
  Filled 2021-07-17: qty 2

## 2021-07-17 MED ORDER — ONDANSETRON HCL 4 MG PO TABS
4.0000 mg | ORAL_TABLET | ORAL | Status: DC | PRN
  Filled 2021-07-17: qty 1

## 2021-07-17 MED ORDER — PROPOFOL 500 MG/50ML IV EMUL
INTRAVENOUS | Status: DC | PRN
  Administered 2021-07-17: 175 ug/kg/min via INTRAVENOUS

## 2021-07-17 MED ORDER — SUCCINYLCHOLINE CHLORIDE 200 MG/10ML IV SOSY
PREFILLED_SYRINGE | INTRAVENOUS | Status: DC | PRN
  Administered 2021-07-17: 180 mg via INTRAVENOUS

## 2021-07-17 MED ORDER — PROPOFOL 10 MG/ML IV BOLUS
INTRAVENOUS | Status: AC
Start: 2021-07-17 — End: ?
  Filled 2021-07-17: qty 20

## 2021-07-17 MED ORDER — ONDANSETRON HCL 4 MG/2ML IJ SOLN
4.0000 mg | Freq: Once | INTRAMUSCULAR | Status: AC
  Administered 2021-07-17: 4 mg via INTRAVENOUS

## 2021-07-17 MED ORDER — LIDOCAINE 2% (20 MG/ML) 5 ML SYRINGE
INTRAMUSCULAR | Status: DC | PRN
  Administered 2021-07-17: 80 mg via INTRAVENOUS

## 2021-07-17 MED ORDER — MIDAZOLAM HCL 5 MG/5ML IJ SOLN
INTRAMUSCULAR | Status: DC | PRN
Start: 2021-07-17 — End: 2021-07-17
  Administered 2021-07-17: 2 mg via INTRAVENOUS

## 2021-07-17 MED ORDER — PROPOFOL 10 MG/ML IV BOLUS
INTRAVENOUS | Status: DC | PRN
  Administered 2021-07-17: 160 mg via INTRAVENOUS

## 2021-07-17 MED ORDER — ROCURONIUM BROMIDE 10 MG/ML (PF) SYRINGE
PREFILLED_SYRINGE | INTRAVENOUS | Status: DC | PRN
  Administered 2021-07-17: 5 mg via INTRAVENOUS

## 2021-07-17 MED ORDER — MIDAZOLAM HCL 2 MG/2ML IJ SOLN
INTRAMUSCULAR | Status: AC
  Filled 2021-07-17: qty 2

## 2021-07-17 MED ORDER — TRAMADOL HCL 50 MG PO TABS
50.0000 mg | ORAL_TABLET | Freq: Once | ORAL | Status: AC
  Administered 2021-07-17: 50 mg via ORAL
  Filled 2021-07-17: qty 1

## 2021-07-17 MED ORDER — ONDANSETRON HCL 4 MG/2ML IJ SOLN
INTRAMUSCULAR | Status: DC | PRN
  Administered 2021-07-17: 4 mg via INTRAVENOUS

## 2021-07-17 MED ORDER — LACTATED RINGERS IV SOLN: INTRAVENOUS | Status: DC | PRN

## 2021-07-17 NOTE — Progress Notes (Signed)
°  Progress Note   Patient: Patricia Hart [id number] DOB: [birth year] DOA: 07/16/2021     0 DOS: the patient was seen and examined on 07/17/2021   Brief hospital course: 61 y.o. female with medical history significant of osteoarthritis, history of pregnancy-induced DVT, fibromyalgia, GERD, history of PE, overactive bladder, duodenal stricture who underwent an EGD on 07/16/2021 for dilatation possible stent placement for duodenal stricture which was followed by multiple episodes of nausea and emesis while in recovery in the endoscopic suite.  She was subsequently started on IV fluids and kept under observation under hospitalist service.  GI was consulted.  Assessment and Plan: * Intractable nausea and vomiting- (present on admission) - Still having nausea and intermittent vomiting.  Continue scheduled and as needed antiemetics.  Continue IV Protonix. -GI planning for possible EGD today.  Duodenal stricture - Status post duodenal stent placement by GI on 07/16/2021.  Repeat CT scan after procedure did not show any perforation.  Follow further GI recommendations.  Hyponatremia - Continue IV fluids.  Repeat a.m. labs.  Hypokalemia - Replace.  Repeat a.m. labs.  Leukocytosis - Possibly reactive.  Monitor.  Elevated ALT measurement- (present on admission) - Slightly improving.  Monitor.  Hyperglycemia- (present on admission) - Probably reactive.  Monitor   Subjective:  Patient seen and examined at bedside.  Feels slightly better but still complains of intermittent nausea and had 2 episodes of vomiting this morning.  No fever, chest pain, shortness of breath reported.  Physical Exam: Vitals:   07/16/21 1927 07/16/21 2339 07/17/21 0401 07/17/21 1048  BP: (!) 144/82 (!) 146/81 (!) 155/76 (!) 183/87  Pulse: 77 86 (!) 52 67  Resp: 18 20 16 20   Temp: 98.7 F (37.1 C) (!) 100.5 F (38.1 C) 99.5 F (37.5 C) 99 F (37.2 C)  TempSrc: Oral Oral Oral Temporal  SpO2: 100% 97% 96% 95%   Weight:    59 kg  Height:    5\' 4"  (1.626 m)   General: No acute distress, currently on room air ENT/neck: No elevated JVD.  No obvious masses  respiratory: Bilateral decreased breath sounds at bases with some scattered crackles CVS: S1-S2 heard, intermittently bradycardic  abdominal: Soft, nontender, nondistended, no organomegaly, bowel sounds heard Extremities: No cyanosis, clubbing, edema CNS: Alert, awake and oriented.  No focal neurologic deficit.  Moving extremities. Lymph: No cervical lymphadenopathy Skin: No rashes, lesions, ulcers Psych: Normal mood, affect and judgment Musculoskeletal: No obvious joint deformity/tenderness/swelling   Data Reviewed: I have reviewed patient's investigations during this hospitalization myself.  Sodium is 133 today, potassium of 3.3, creatinine of 0.71, ALT of 647, WBC of 11.   Family Communication: None at bedside  Disposition: Status is: Observation The patient will require care spanning > 2 midnights and should be moved to inpatient because: Of continued nausea and vomiting with possible need for EGD today.        Planned Discharge Destination: Home     Time spent: 50 minutes  Author: Aline August, MD 07/17/2021 11:33 AM  For on call review [email or web address].

## 2021-07-17 NOTE — Progress Notes (Signed)
Patient ID: Patricia Hart, female   DOB: 03-30-1961, 61 y.o.   MRN: [id number]    Progress Note   Subjective   Day #1 CC;nausea/ pain post duodenal stent  CT abdomen and pelvis-stomach unremarkable, duodenal stent in place no complicating features and no surrounding hematoma or free air to suggest perforation  WBC 11.0/hemoglobin 12.8/hematocrit 37.1 Potassium 3.3/BUN 11/creatinine 0.71  Patient is still absolutely miserable, retching, dry heaves and still complaining of pain though she says the pain is not as bad as yesterday.  She would like the stent removed   Objective   Vital signs in last 24 hours: Temp:  [97.5 F (36.4 C)-100.5 F (38.1 C)] 99.5 F (37.5 C) (02/23 0401) Pulse Rate:  [49-86] 52 (02/23 0401) Resp:  [11-27] 16 (02/23 0401) BP: (121-180)/(48-151) 155/76 (02/23 0401) SpO2:  [92 %-100 %] 96 % (02/23 0401) Weight:  [59 kg] 59 kg (02/22 0912) Last BM Date : 07/16/21 General: Older white female in NAD-very uncomfortable appearing Heart:  Regular rate and rhythm; no murmurs Lungs: Respirations even and unlabored, lungs CTA bilaterally Abdomen:  Soft, tender in the epigastrium and nondistended. Normal bowel sounds. Extremities:  Without edema. Neurologic:  Alert and oriented,  grossly normal neurologically. Psych:  Cooperative. Normal mood and affect.  Intake/Output from previous day: 02/22 0701 - 02/23 0700 In: 2517.2 [I.V.:1417.2; IV Piggyback:1100] Out: -  Intake/Output this shift: No intake/output data recorded.  Lab Results: Recent Labs    07/16/21 1248 07/17/21 0518  WBC 11.0* 11.0*  HGB 14.0 12.8  HCT 39.7 37.1  PLT 267 253   BMET Recent Labs    07/16/21 1248 07/17/21 0518  NA 138 133*  K 4.2 3.3*  CL 106 102  CO2 22 23  GLUCOSE 140* 146*  BUN 19 11  CREATININE 0.84 0.71  CALCIUM 9.2 8.5*   LFT Recent Labs    07/17/21 0518  PROT 7.0  ALBUMIN 4.0  AST 30  ALT 47*  ALKPHOS 48  BILITOT 0.4   PT/INR No results for  input(s): LABPROT, INR in the last 72 hours.  Studies/Results: CT ABDOMEN W CONTRAST  Result Date: 07/16/2021 CLINICAL DATA:  Status post duodenal stent. Upper abdominal pain and nausea. EXAM: CT ABDOMEN WITH CONTRAST TECHNIQUE: Multidetector CT imaging of the abdomen was performed using the standard protocol following bolus administration of intravenous contrast. RADIATION DOSE REDUCTION: This exam was performed according to the departmental dose-optimization program which includes automated exposure control, adjustment of the mA and/or kV according to patient size and/or use of iterative reconstruction technique. CONTRAST:  123mL OMNIPAQUE IOHEXOL 300 MG/ML  SOLN COMPARISON:  CT scan 01/16/2021 FINDINGS: Lower chest: Minimal streaky areas of subsegmental atelectasis. No infiltrates, effusions or pneumothorax. The heart is normal in size. No pericardial effusion. Hepatobiliary: Diffuse and fairly marked fatty infiltration of the liver. Stable left hepatic lobe cyst. No worrisome hepatic lesions or intrahepatic biliary dilatation. Gallbladder is unremarkable. No common bile duct dilatation. Pancreas: No mass, inflammation or ductal dilatation. Spleen: Normal size.  No focal lesions. Adrenals/Urinary Tract: Adrenal glands are normal. No renal lesions or hydronephrosis. No collecting system abnormalities on the delayed images. Stomach/Bowel: The stomach is unremarkable. There is a duodenal stent in place. No complicating features are identified. No surrounding hematoma or free air to suggest perforation. The visualized small bowel and colon are unremarkable. Vascular/Lymphatic: The aorta is normal in caliber. No dissection. The branch vessels are patent. Scattered atherosclerotic calcifications are stable. The major venous structures are patent. No mesenteric  or retroperitoneal mass or adenopathy. Small scattered lymph nodes are noted. Other: No ascites or free air. Musculoskeletal: No significant bony findings.  IMPRESSION: 1. Duodenal stent in place without complicating features. 2. No acute abdominal findings, mass lesions or adenopathy. 3. Diffuse and fairly marked fatty infiltration of the liver. Electronically Signed   By: Marijo Sanes M.D.   On: 07/16/2021 14:16   DG Chest Port 1V same Day  Result Date: 07/16/2021 CLINICAL DATA:  Patient is status post duodenal dilatation. EXAM: PORTABLE CHEST 1 VIEW COMPARISON:  None. FINDINGS: Normal cardiac and mediastinal contours. No consolidative pulmonary opacities. No pleural effusion or pneumothorax. IMPRESSION: No acute cardiopulmonary process. Electronically Signed   By: Lovey Newcomer M.D.   On: 07/16/2021 11:34   DG Abd Portable 2V  Result Date: 07/16/2021 CLINICAL DATA:  A 61 year old feet presents for evaluation of suspected bowel perforation, post EGD with abdominal pain. EXAM: PORTABLE ABDOMEN - 2 VIEW COMPARISON:  February 19, 2016. FINDINGS: Relative paucity of gas in the abdomen. Upright view without signs of free air. Abdominal bowel gas pattern is incompletely imaged and body habitus does limit assessment. No gross distension of bowel loops within the mid and upper abdomen. Lung bases are clear. Decubitus radiograph also without signs of pneumoperitoneum. No acute regional skeletal process. IMPRESSION: No signs of pneumoperitoneum. Abdominal bowel gas evaluated in the central and upper abdomen without bowel distension. If there is continued or worsening pain given that the duodenum is largely retroperitoneal could consider CT as warranted for further evaluation. Electronically Signed   By: Zetta Bills M.D.   On: 07/16/2021 11:36       Assessment / Plan:    #10 61 year old white female with history of duodenal stricture who had been undergoing sequential dilations, and underwent placement of a covered duodenal stent yesterday. Patient developed uncontrollable nausea and vomiting postprocedure and also complained of abdominal pain.  Work-up is  reassuring, no evidence for perforation, however she is still miserable this morning, having dry heaves and retching, feels very nauseated despite antiemetics and continues to complain of abdominal pain.  Plan; keep n.p.o. Continue IV antiemetics Will discuss with Dr. Rush Landmark  and team and coordinate for removal of the duodenal stent later today.     Principal Problem:   Intractable nausea and vomiting Active Problems:   Esophageal stricture   Hyperglycemia   Elevated ALT measurement   Hepatic steatosis     LOS: 0 days   Carlee Vonderhaar PA-C 07/17/2021, 8:52 AM

## 2021-07-17 NOTE — Assessment & Plan Note (Addendum)
-   Probably reactive.  Outpatient follow-up with PCP.

## 2021-07-17 NOTE — Assessment & Plan Note (Addendum)
-   Mild.  Outpatient follow-up with GI.

## 2021-07-17 NOTE — Anesthesia Procedure Notes (Signed)
Procedure Name: Intubation Date/Time: 07/17/2021 11:52 AM Performed by: Viviana Trimble, Clinical cytogeneticist D, CRNA Pre-anesthesia Checklist: Patient identified, Emergency Drugs available, Suction available and Patient being monitored Patient Re-evaluated:Patient Re-evaluated prior to induction Oxygen Delivery Method: Circle system utilized Preoxygenation: Pre-oxygenation with 100% oxygen Induction Type: IV induction Ventilation: Mask ventilation without difficulty Laryngoscope Size: Mac and 3 Grade View: Grade I Tube type: Oral Number of attempts: 1 Airway Equipment and Method: Stylet and Oral airway Placement Confirmation: ETT inserted through vocal cords under direct vision, positive ETCO2 and breath sounds checked- equal and bilateral Secured at: 21 cm Tube secured with: Tape Dental Injury: Teeth and Oropharynx as per pre-operative assessment

## 2021-07-17 NOTE — Interval H&P Note (Signed)
History and Physical Interval Note:  I was asked by inpatient team to help with duodenal stent removal, discussed the case with AE and GM.    07/17/2021 10:56 AM  Patricia Hart  has presented today for surgery, with the diagnosis of axios stent removal.  The various methods of treatment have been discussed with the patient and family. After consideration of risks, benefits and other options for treatment, the patient has consented to  Procedure(s): ESOPHAGOGASTRODUODENOSCOPY (EGD) (N/A) as a surgical intervention.  The patient's history has been reviewed, patient examined, no change in status, stable for surgery.  I have reviewed the patient's chart and labs.  Questions were answered to the patient's satisfaction.     Rachael Fee

## 2021-07-17 NOTE — Anesthesia Preprocedure Evaluation (Addendum)
Anesthesia Evaluation  Patient identified by MRN, date of birth, ID band Patient awake    Reviewed: Allergy & Precautions, NPO status , Patient's Chart, lab work & pertinent test results  Airway Mallampati: II  TM Distance: >3 FB Neck ROM: Full    Dental no notable dental hx. (+) Teeth Intact, Dental Advisory Given   Pulmonary PE   Pulmonary exam normal breath sounds clear to auscultation       Cardiovascular + DVT  Normal cardiovascular exam Rhythm:Regular Rate:Normal     Neuro/Psych negative neurological ROS  negative psych ROS   GI/Hepatic Neg liver ROS, GERD  Medicated,  Endo/Other  negative endocrine ROS  Renal/GU negative Renal ROS  negative genitourinary   Musculoskeletal  (+) Arthritis , Fibromyalgia -  Abdominal   Peds  Hematology negative hematology ROS (+)   Anesthesia Other Findings Duodenal stent removal  Reproductive/Obstetrics                            Anesthesia Physical Anesthesia Plan  ASA: 2  Anesthesia Plan: General   Post-op Pain Management:    Induction: Intravenous  PONV Risk Score and Plan: 3 and Midazolam, Dexamethasone, Ondansetron and TIVA  Airway Management Planned: Oral ETT  Additional Equipment:   Intra-op Plan:   Post-operative Plan: Extubation in OR  Informed Consent: I have reviewed the patients History and Physical, chart, labs and discussed the procedure including the risks, benefits and alternatives for the proposed anesthesia with the patient or authorized representative who has indicated his/her understanding and acceptance.     Dental advisory given  Plan Discussed with: CRNA  Anesthesia Plan Comments: (Proceduralist requested general anesthesia. )       Anesthesia Quick Evaluation

## 2021-07-17 NOTE — Anesthesia Postprocedure Evaluation (Signed)
Anesthesia Post Note  Patient: Patricia Hart  Procedure(s) Performed: ESOPHAGOGASTRODUODENOSCOPY (EGD) STENT REMOVAL     Patient location during evaluation: Endoscopy Anesthesia Type: General Level of consciousness: awake and alert Pain management: pain level controlled Vital Signs Assessment: post-procedure vital signs reviewed and stable Respiratory status: spontaneous breathing, nonlabored ventilation, respiratory function stable and patient connected to nasal cannula oxygen Cardiovascular status: blood pressure returned to baseline and stable Postop Assessment: no apparent nausea or vomiting Anesthetic complications: no   No notable events documented.  Last Vitals:  Vitals:   07/17/21 1230 07/17/21 1238  BP: 121/66 121/66  Pulse: 87 86  Resp: (!) 23 17  Temp:    SpO2: 93% 95%    Last Pain:  Vitals:   07/17/21 1238  TempSrc:   PainSc: 0-No pain                 Joe Gee L Lakesa Coste

## 2021-07-17 NOTE — Assessment & Plan Note (Addendum)
-   Treated with IV Protonix, antiemetics and fluids. -Status post EGD and stent removal on 07/17/2021. -Subsequently, diet was advanced to soft diet on 07/18/2021 with plans for discharge home but she did not tolerate soft diet and vomited.  Discharge held.  Switched back to full liquid diet. -Still complains of intermittent nausea with vomiting.  Will continue with full liquid diet and advance to soft diet later today if tolerated.  Continue antiemetics.

## 2021-07-17 NOTE — Op Note (Signed)
[facility] Patient Name: Patricia Hart Procedure Date: 07/17/2021 MRN: [id number] Attending MD: Milus Banister , MD Date of Birth: [birth year] CSN: [id number] Age: 61 Admit Type: Outpatient Procedure:                Upper GI endoscopy Indications:              Nausea with vomiting since Axios stent placement                            across duodenal stenosis yesterday Providers:                Milus Banister, MD, Carlyn Reichert, RN, Frazier Richards, Technician Referring MD:              Medicines:                General Anesthesia Complications:            No immediate complications. Estimated blood loss:                            None. Estimated Blood Loss:     Estimated blood loss: none. Procedure:                Pre-Anesthesia Assessment:                           - Prior to the procedure, a History and Physical                            was performed, and patient medications and                            allergies were reviewed. The patient's tolerance of                            previous anesthesia was also reviewed. The risks                            and benefits of the procedure and the sedation                            options and risks were discussed with the patient.                            All questions were answered, and informed consent                            was obtained. Prior Anticoagulants: The patient has                            taken no previous anticoagulant or antiplatelet                            agents. ASA Grade  Assessment: II - A patient with                            mild systemic disease. After reviewing the risks                            and benefits, the patient was deemed in                            satisfactory condition to undergo the procedure.                           After obtaining informed consent, the endoscope was                            passed under direct vision.  Throughout the                            procedure, the patient's blood pressure, pulse, and                            oxygen saturations were monitored continuously. The                            [id number] Olympus therapeutic endoscope                            was introduced through the mouth, and advanced to                            the second part of duodenum. The upper GI endoscopy                            was accomplished without difficulty. The patient                            tolerated the procedure well. Scope In: Scope Out: Findings:      The proximal edge of the previously placed Axios stent was found in the       distal aspect of the duodenal bulb. The stent lumen was patent, I was       able to pass the 10.81mm diameter therapeutic gastroscope through the       stent into the more distal duodenum without difficulty. I removed the       stent with a rat toothed forceps. After removal I inspected the site and       there was some very minor, superficial mucosal irritation but no ulcers.       I was able to traverse the site into the more distal duodenum without       difficulty.      UGI tract was otherwise essentially normal. Impression:               - Duodenal Axios stent was removed with forceps.                           -  The stent lumen was pretty clearly patent, as I                            was able to pass through the stent into the more                            distal duodenum without difficulty with 10.28mm                            diameter gastroscope. Not sure that her symptoms                            since stent was placed yesterday were all                            'mechanical'. Moderate Sedation:      Not Applicable - Patient had care per Anesthesia. Recommendation:           - She is probably safe for discharge later this                            afternoon.                           - Follow up in GI office in 6-7  weeks. Procedure Code(s):        --- Professional ---                           [phone], Esophagogastroduodenoscopy, flexible,                            transoral; with removal of foreign body(s) Diagnosis Code(s):        --- Professional ---                           Z46.59, Encounter for fitting and adjustment of                            other gastrointestinal appliance and device                           R11.2, Nausea with vomiting, unspecified CPT copyright 2019 [employer]. All rights reserved. The codes documented in this report are preliminary and upon coder review may  be revised to meet current compliance requirements. Milus Banister, MD 07/17/2021 12:15:49 PM This report has been signed electronically. Number of Addenda: 0

## 2021-07-17 NOTE — Assessment & Plan Note (Addendum)
-   No labs today.  Repeat a.m. labs.Marland Kitchen

## 2021-07-17 NOTE — Hospital Course (Addendum)
61 y.o. female with medical history significant of osteoarthritis, history of pregnancy-induced DVT, fibromyalgia, GERD, history of PE, overactive bladder, duodenal stricture who underwent an EGD on 07/16/2021 for dilatation possible stent placement for duodenal stricture which was followed by multiple episodes of nausea and emesis while in recovery in the endoscopic suite.  She was subsequently started on IV fluids and kept under observation under hospitalist service.  GI was consulted.  She underwent EGD and stent removal on 07/17/2021.  Subsequently, diet was advanced to soft diet on 07/18/2021 with plans for discharge home but she did not tolerate soft diet and vomited.  Discharge held.  Switched back to full liquid diet.

## 2021-07-17 NOTE — Transfer of Care (Signed)
Immediate Anesthesia Transfer of Care Note  Patient: Patricia Hart  Procedure(s) Performed: ESOPHAGOGASTRODUODENOSCOPY (EGD) STENT REMOVAL  Patient Location: PACU  Anesthesia Type:General  Level of Consciousness: awake, alert  and oriented  Airway & Oxygen Therapy: Patient Spontanous Breathing and Patient connected to face mask oxygen  Post-op Assessment: Report given to RN and Post -op Vital signs reviewed and stable  Post vital signs: Reviewed and stable  Last Vitals:  Vitals Value Taken Time  BP 119/73 07/17/21 1217  Temp    Pulse 100 07/17/21 1219  Resp 19 07/17/21 1219  SpO2 93 % 07/17/21 1219  Vitals shown include unvalidated device data.  Last Pain:  Vitals:   07/17/21 1048  TempSrc: Temporal  PainSc: 3          Complications: No notable events documented.

## 2021-07-17 NOTE — Assessment & Plan Note (Addendum)
-   Possibly reactive.   °

## 2021-07-17 NOTE — Assessment & Plan Note (Addendum)
-   Status post duodenal stent placement by GI on 07/16/2021.  Repeat CT scan after procedure did not show any perforation. -Status post EGD and removal of duodenal stent on 07/17/2021 by GI.  Outpatient follow-up with GI.

## 2021-07-17 NOTE — Progress Notes (Signed)
Stopped by to see this patient. She feels much better, has tolerated clears and would like to try eating solid food.  I will order soft diet.  If tolerates ovn, can go home in AM on prn anti-emetics and Dr Meridee Score will plan outpatient follow up.   Amada Jupiter, MD

## 2021-07-17 NOTE — Assessment & Plan Note (Addendum)
-   Mild.  No labs today.  Currently on IV fluids.  Repeat a.m. labs.

## 2021-07-18 ENCOUNTER — Observation Stay (HOSPITAL_COMMUNITY): Payer: BC Managed Care – PPO

## 2021-07-18 ENCOUNTER — Encounter (HOSPITAL_COMMUNITY): Payer: Self-pay | Admitting: Gastroenterology

## 2021-07-18 ENCOUNTER — Telehealth: Payer: Self-pay

## 2021-07-18 DIAGNOSIS — R112 Nausea with vomiting, unspecified: Secondary | ICD-10-CM | POA: Diagnosis not present

## 2021-07-18 DIAGNOSIS — R7401 Elevation of levels of liver transaminase levels: Secondary | ICD-10-CM | POA: Diagnosis not present

## 2021-07-18 DIAGNOSIS — R1110 Vomiting, unspecified: Secondary | ICD-10-CM | POA: Diagnosis not present

## 2021-07-18 DIAGNOSIS — R739 Hyperglycemia, unspecified: Secondary | ICD-10-CM | POA: Diagnosis not present

## 2021-07-18 DIAGNOSIS — K315 Obstruction of duodenum: Secondary | ICD-10-CM | POA: Diagnosis not present

## 2021-07-18 DIAGNOSIS — Z981 Arthrodesis status: Secondary | ICD-10-CM | POA: Diagnosis not present

## 2021-07-18 LAB — CBC WITH DIFFERENTIAL/PLATELET
Abs Immature Granulocytes: 0.11 10*3/uL — ABNORMAL HIGH (ref 0.00–0.07)
Basophils Absolute: 0 10*3/uL (ref 0.0–0.1)
Basophils Relative: 0 %
Eosinophils Absolute: 0 10*3/uL (ref 0.0–0.5)
Eosinophils Relative: 0 %
HCT: 35.3 % — ABNORMAL LOW (ref 36.0–46.0)
Hemoglobin: 12.2 g/dL (ref 12.0–15.0)
Immature Granulocytes: 1 %
Lymphocytes Relative: 18 %
Lymphs Abs: 2 10*3/uL (ref 0.7–4.0)
MCH: 31.4 pg (ref 26.0–34.0)
MCHC: 34.6 g/dL (ref 30.0–36.0)
MCV: 91 fL (ref 80.0–100.0)
Monocytes Absolute: 0.8 10*3/uL (ref 0.1–1.0)
Monocytes Relative: 7 %
Neutro Abs: 8.6 10*3/uL — ABNORMAL HIGH (ref 1.7–7.7)
Neutrophils Relative %: 74 %
Platelets: 204 10*3/uL (ref 150–400)
RBC: 3.88 MIL/uL (ref 3.87–5.11)
RDW: 13.1 % (ref 11.5–15.5)
WBC: 11.6 10*3/uL — ABNORMAL HIGH (ref 4.0–10.5)
nRBC: 0 % (ref 0.0–0.2)

## 2021-07-18 LAB — COMPREHENSIVE METABOLIC PANEL
ALT: 61 U/L — ABNORMAL HIGH (ref 0–44)
AST: 40 U/L (ref 15–41)
Albumin: 3.8 g/dL (ref 3.5–5.0)
Alkaline Phosphatase: 42 U/L (ref 38–126)
Anion gap: 8 (ref 5–15)
BUN: 11 mg/dL (ref 6–20)
CO2: 23 mmol/L (ref 22–32)
Calcium: 8.3 mg/dL — ABNORMAL LOW (ref 8.9–10.3)
Chloride: 102 mmol/L (ref 98–111)
Creatinine, Ser: 0.55 mg/dL (ref 0.44–1.00)
GFR, Estimated: >60 mL/min (ref >60)
Glucose, Bld: 132 mg/dL — ABNORMAL HIGH (ref 70–99)
Potassium: 3.3 mmol/L — ABNORMAL LOW (ref 3.5–5.1)
Sodium: 133 mmol/L — ABNORMAL LOW (ref 135–145)
Total Bilirubin: 0.3 mg/dL (ref 0.3–1.2)
Total Protein: 6.6 g/dL (ref 6.5–8.1)

## 2021-07-18 LAB — MAGNESIUM: Magnesium: 1.8 mg/dL (ref 1.7–2.4)

## 2021-07-18 LAB — GLUCOSE, CAPILLARY
Glucose-Capillary: 142 mg/dL — ABNORMAL HIGH (ref 70–99)
Glucose-Capillary: 137 mg/dL — ABNORMAL HIGH (ref 70–99)
Glucose-Capillary: 122 mg/dL — ABNORMAL HIGH (ref 70–99)
Glucose-Capillary: 130 mg/dL — ABNORMAL HIGH (ref 70–99)

## 2021-07-18 MED ORDER — PROCHLORPERAZINE 25 MG RE SUPP: 25.0000 mg | Freq: Four times a day (QID) | RECTAL | 0 refills | Status: AC | PRN

## 2021-07-18 MED ORDER — ONDANSETRON HCL 4 MG PO TABS: 4.0000 mg | ORAL_TABLET | ORAL | 0 refills | Status: AC | PRN

## 2021-07-18 MED ORDER — ONDANSETRON HCL 4 MG/2ML IJ SOLN
4.0000 mg | Freq: Four times a day (QID) | INTRAMUSCULAR | Status: DC
  Administered 2021-07-18 – 2021-07-19 (×4): 4 mg via INTRAVENOUS
  Filled 2021-07-18 (×4): qty 2

## 2021-07-18 MED ORDER — POTASSIUM CHLORIDE CRYS ER 20 MEQ PO TBCR
40.0000 meq | EXTENDED_RELEASE_TABLET | Freq: Once | ORAL | Status: AC
  Administered 2021-07-18: 40 meq via ORAL
  Filled 2021-07-18: qty 2

## 2021-07-18 MED ORDER — METOCLOPRAMIDE HCL 5 MG/ML IJ SOLN
10.0000 mg | Freq: Four times a day (QID) | INTRAMUSCULAR | Status: DC | PRN
  Administered 2021-07-18 – 2021-07-19 (×4): 10 mg via INTRAVENOUS
  Filled 2021-07-18 (×4): qty 2

## 2021-07-18 NOTE — Progress Notes (Addendum)
Patient ID: Patricia Hart, female   DOB: 08-Jun-1960, 61 y.o.   MRN: [id number]    Progress Note   Subjective  Day # 2  CC; duodenal stricture  EGD yesterday with removal of covered stent  WBC 11.6/hemoglobin 12.2/hematocrit 35.3 stable Potassium 3.3   Patient says she definitely felt a whole lot better as soon as the stent was removed, unfortunately still having problems with significant nausea, has had some intermittent dry heaves/retching.  She does not like the food here and says some of the smells of broth etc. are making her nauseated.  She was able to keep a little bit of clear liquid down Continues to have some epigastric discomfort but definitely improved compared to yesterday    Objective   Vital signs in last 24 hours: Temp:  [97.8 F (36.6 C)-100 F (37.8 C)] 98.6 F (37 C) (02/24 0340) Pulse Rate:  [64-99] 64 (02/24 0340) Resp:  [17-26] 18 (02/24 0340) BP: (119-183)/(66-91) 157/91 (02/24 0340) SpO2:  [92 %-95 %] 95 % (02/24 0340) Weight:  [59 kg] 59 kg (02/23 1048) Last BM Date : 07/16/21 General: Older white female in NAD, uncomfortable appearing Heart:  Regular rate and rhythm; no murmurs Lungs: Respirations even and unlabored, lungs CTA bilaterally Abdomen:  Soft, minimally tender in the epigastrium ,and nondistended. Normal bowel sounds. Extremities:  Without edema. Neurologic:  Alert and oriented,  grossly normal neurologically. Psych:  Cooperative. Normal mood and affect.  Intake/Output from previous day: 02/23 0701 - 02/24 0700 In: 2758.7 [P.O.:357; I.V.:2401.7] Out: -  Intake/Output this shift: No intake/output data recorded.  Lab Results: Recent Labs    07/16/21 1248 07/17/21 0518 07/18/21 0531  WBC 11.0* 11.0* 11.6*  HGB 14.0 12.8 12.2  HCT 39.7 37.1 35.3*  PLT 267 253 204   BMET Recent Labs    07/16/21 1248 07/17/21 0518 07/18/21 0531  NA 138 133* 133*  K 4.2 3.3* 3.3*  CL 106 102 102  CO2 22 23 23   GLUCOSE 140* 146* 132*  BUN  19 11 11   CREATININE 0.84 0.71 0.55  CALCIUM 9.2 8.5* 8.3*   LFT Recent Labs    07/18/21 0531  PROT 6.6  ALBUMIN 3.8  AST 40  ALT 61*  ALKPHOS 42  BILITOT 0.3   PT/INR No results for input(s): LABPROT, INR in the last 72 hours.  Studies/Results: CT ABDOMEN W CONTRAST  Result Date: 07/16/2021 CLINICAL DATA:  Status post duodenal stent. Upper abdominal pain and nausea. EXAM: CT ABDOMEN WITH CONTRAST TECHNIQUE: Multidetector CT imaging of the abdomen was performed using the standard protocol following bolus administration of intravenous contrast. RADIATION DOSE REDUCTION: This exam was performed according to the departmental dose-optimization program which includes automated exposure control, adjustment of the mA and/or kV according to patient size and/or use of iterative reconstruction technique. CONTRAST:  173mL OMNIPAQUE IOHEXOL 300 MG/ML  SOLN COMPARISON:  CT scan 01/16/2021 FINDINGS: Lower chest: Minimal streaky areas of subsegmental atelectasis. No infiltrates, effusions or pneumothorax. The heart is normal in size. No pericardial effusion. Hepatobiliary: Diffuse and fairly marked fatty infiltration of the liver. Stable left hepatic lobe cyst. No worrisome hepatic lesions or intrahepatic biliary dilatation. Gallbladder is unremarkable. No common bile duct dilatation. Pancreas: No mass, inflammation or ductal dilatation. Spleen: Normal size.  No focal lesions. Adrenals/Urinary Tract: Adrenal glands are normal. No renal lesions or hydronephrosis. No collecting system abnormalities on the delayed images. Stomach/Bowel: The stomach is unremarkable. There is a duodenal stent in place. No complicating features are  identified. No surrounding hematoma or free air to suggest perforation. The visualized small bowel and colon are unremarkable. Vascular/Lymphatic: The aorta is normal in caliber. No dissection. The branch vessels are patent. Scattered atherosclerotic calcifications are stable. The major  venous structures are patent. No mesenteric or retroperitoneal mass or adenopathy. Small scattered lymph nodes are noted. Other: No ascites or free air. Musculoskeletal: No significant bony findings. IMPRESSION: 1. Duodenal stent in place without complicating features. 2. No acute abdominal findings, mass lesions or adenopathy. 3. Diffuse and fairly marked fatty infiltration of the liver. Electronically Signed   By: Marijo Sanes M.D.   On: 07/16/2021 14:16   DG Chest Port 1V same Day  Result Date: 07/16/2021 CLINICAL DATA:  Patient is status post duodenal dilatation. EXAM: PORTABLE CHEST 1 VIEW COMPARISON:  None. FINDINGS: Normal cardiac and mediastinal contours. No consolidative pulmonary opacities. No pleural effusion or pneumothorax. IMPRESSION: No acute cardiopulmonary process. Electronically Signed   By: Lovey Newcomer M.D.   On: 07/16/2021 11:34   DG Abd Portable 2V  Result Date: 07/16/2021 CLINICAL DATA:  A 61 year old feet presents for evaluation of suspected bowel perforation, post EGD with abdominal pain. EXAM: PORTABLE ABDOMEN - 2 VIEW COMPARISON:  February 19, 2016. FINDINGS: Relative paucity of gas in the abdomen. Upright view without signs of free air. Abdominal bowel gas pattern is incompletely imaged and body habitus does limit assessment. No gross distension of bowel loops within the mid and upper abdomen. Lung bases are clear. Decubitus radiograph also without signs of pneumoperitoneum. No acute regional skeletal process. IMPRESSION: No signs of pneumoperitoneum. Abdominal bowel gas evaluated in the central and upper abdomen without bowel distension. If there is continued or worsening pain given that the duodenum is largely retroperitoneal could consider CT as warranted for further evaluation. Electronically Signed   By: Zetta Bills M.D.   On: 07/16/2021 11:36       Assessment / Plan:    #22 61 year old white female with history of acquired duodenal stricture, status post several  prior dilations.  Patient underwent covered duodenal stent placement on 07/16/2021.  Unfortunately he did not tolerate the stent and had significant abdominal pain, nausea and vomiting postprocedure.  Symptoms persisted and she underwent EGD with removal of the stent yesterday.  Abdominal pain/discomfort has improved but unfortunately she still having significant nausea and has been fairly unsuccessful with taking p.o.'s keeping a little bit of clears down  She does say that she had been having frequent episodes of nausea and nausea and vomiting at home even prior to the stent placement  Plan; advance to regular diet and have asked her to pick up soft bland choices Continue twice daily PPI Continue around-the-clock antiemetics If she is able to keep something down today and does not continue retching I think she can be discharged later this afternoon Will need prescription for Phenergan suppositories 25 mg every 6 hours as needed for vomiting Will need prescription for Zofran ODT 4 mg every 6 hours, would advise she take this around-the-clock over the next several days until feeling significantly better then can go to as needed Will arrange for outpatient follow-up with Dr. Rush Landmark.    Principal Problem:   Intractable nausea and vomiting Active Problems:   Hyperglycemia   Elevated ALT measurement   Duodenal stricture   Hyponatremia   Hypokalemia   Leukocytosis     LOS: 0 days   Kentravious Lipford PA-C 07/18/2021, 8:54 AM

## 2021-07-18 NOTE — Discharge Summary (Addendum)
Physician Discharge Summary   Patient: Patricia Hart MRN: [id number] DOB: June 01, 1960  Admit date:     07/16/2021  Discharge date: 07/19/21  Discharge Physician: Glade Lloyd   PCP: Irena Reichmann, DO   Recommendations at discharge:   Outpatient follow-up with PCP within a week Follow-up with GI Follow-up in the ED if symptoms worsen or new appear  Hospital Course: 61 y.o. female with medical history significant of osteoarthritis, history of pregnancy-induced DVT, fibromyalgia, GERD, history of PE, overactive bladder, duodenal stricture who underwent an EGD on 07/16/2021 for dilatation possible stent placement for duodenal stricture which was followed by multiple episodes of nausea and emesis while in recovery in the endoscopic suite.  She was subsequently started on IV fluids and kept under observation under hospitalist service.  GI was consulted.  She underwent EGD and stent removal on 07/17/2021.  Subsequently, diet was advanced to soft diet on 07/18/2021 with plans for discharge home but she did not tolerate soft diet and vomited.  Discharge held.  Switched back to full liquid diet. Her diet was advanced today to soft diet and she has tolerated it. GI has cleared her for discharge. Discharge patient home today on oral antiemetics.  Discharge diagnosis and assessment and Plan: Intractable nausea and vomiting- (present on admission) - Treated with IV Protonix, antiemetics and fluids. -Status post EGD and stent removal on 07/17/2021. -Subsequently, diet was advanced to soft diet on 07/18/2021 with plans for discharge home but she did not tolerate soft diet and vomited.  Discharge held.  Switched back to full liquid diet. Her diet was advanced today to soft diet and she has tolerated it. GI has cleared her for discharge. Discharge patient home today on oral antiemetics.   Duodenal stricture - Status post duodenal stent placement by GI on 07/16/2021.  Repeat CT scan after procedure did not show  any perforation. -Status post EGD and removal of duodenal stent on 07/17/2021 by GI.  Outpatient follow-up with GI.   Hyponatremia - Mild.  No labs today.  Currently on IV fluids.     Hypokalemia - No labs today.     Leukocytosis - Possibly reactive.    Elevated ALT measurement- (present on admission) - Mild.  Outpatient follow-up with GI.   Hyperglycemia- (present on admission) - Probably reactive.  Outpatient follow-up with PCP.               Consultants: GI Procedures performed: EGD and removal of duodenal stent on 07/17/2021 Disposition: Home Diet recommendation:  Discharge Diet Orders (From admission, onward)     Start     Ordered   07/18/21 0000  Diet - low sodium heart healthy       Comments: Soft diet   07/18/21 1019           Soft diet  DISCHARGE MEDICATION: Allergies as of 07/18/2021       Reactions   Latex Other (See Comments)   Skin irritation        Medication List     TAKE these medications    acetaminophen 500 MG tablet Commonly known as: TYLENOL Take 1,500 mg by mouth every 6 (six) hours as needed for mild pain.   b complex vitamins capsule Take 1 capsule by mouth daily.   diclofenac Sodium 1 % Gel Commonly known as: VOLTAREN Apply 1 application topically daily.   guaiFENesin 600 MG 12 hr tablet Commonly known as: MUCINEX Take 600 mg by mouth daily as needed (Allergies).   MULTIVITAMIN PO  Take 1 tablet by mouth daily.   ondansetron 4 MG tablet Commonly known as: ZOFRAN Take 1 tablet (4 mg total) by mouth every 4 (four) hours as needed for nausea.   pantoprazole 40 MG tablet Commonly known as: PROTONIX Take 1 tablet (40 mg total) by mouth 2 (two) times daily before a meal.   prochlorperazine 25 MG suppository Commonly known as: COMPAZINE Place 1 suppository (25 mg total) rectally every 6 (six) hours as needed for nausea or vomiting. If oral anti-nausea medication is not helping What changed: when to take this    promethazine 12.5 MG tablet Commonly known as: PHENERGAN Take 1 tablet (12.5 mg total) by mouth every 6 (six) hours as needed for nausea or vomiting.   sucralfate 1 g tablet Commonly known as: Carafate Take 1 tablet (1 g total) by mouth 4 (four) times daily -  with meals and at bedtime.        Follow-up Information     Irena Reichmann, DO. Schedule an appointment as soon as possible for a visit in 1 week(s).   Specialty: Family Medicine Contact information: [address] [place] [address] [place] [address] [phone]               Subjective: Patient seen and examined at bedside.  Feels better and tolerating some diet although does not want to eat much.  No worsening abdominal, fever, shortness of breath reported.  Discharge Exam: Filed Weights   07/16/21 0912 07/17/21 1048  Weight: 59 kg 59 kg   General: No acute distress, currently on room air respiratory: Bilateral decreased breath sounds at bases with some crackles CVS: S1-S2 heard, rate controlled Abdominal: Soft, nontender, nondistended, no organomegaly, bowel sounds heard Extremities: No cyanosis, clubbing, edema  Condition at discharge: fair  The results of significant diagnostics from this hospitalization (including imaging, microbiology, ancillary and laboratory) are listed below for reference.   Imaging Studies: CT ABDOMEN W CONTRAST  Result Date: 07/16/2021 CLINICAL DATA:  Status post duodenal stent. Upper abdominal pain and nausea. EXAM: CT ABDOMEN WITH CONTRAST TECHNIQUE: Multidetector CT imaging of the abdomen was performed using the standard protocol following bolus administration of intravenous contrast. RADIATION DOSE REDUCTION: This exam was performed according to the departmental dose-optimization program which includes automated exposure control, adjustment of the mA and/or kV according to patient size and/or use of iterative reconstruction technique. CONTRAST:  OMNIPAQUE IOHEXOL 300  MG/ML  SOLN COMPARISON:  CT scan 01/16/2021 FINDINGS: Lower chest: Minimal streaky areas of subsegmental atelectasis. No infiltrates, effusions or pneumothorax. The heart is normal in size. No pericardial effusion. Hepatobiliary: Diffuse and fairly marked fatty infiltration of the liver. Stable left hepatic lobe cyst. No worrisome hepatic lesions or intrahepatic biliary dilatation. Gallbladder is unremarkable. No common bile duct dilatation. Pancreas: No mass, inflammation or ductal dilatation. Spleen: Normal size.  No focal lesions. Adrenals/Urinary Tract: Adrenal glands are normal. No renal lesions or hydronephrosis. No collecting system abnormalities on the delayed images. Stomach/Bowel: The stomach is unremarkable. There is a duodenal stent in place. No complicating features are identified. No surrounding hematoma or free air to suggest perforation. The visualized small bowel and colon are unremarkable. Vascular/Lymphatic: The aorta is normal in caliber. No dissection. The branch vessels are patent. Scattered atherosclerotic calcifications are stable. The major venous structures are patent. No mesenteric or retroperitoneal mass or adenopathy. Small scattered lymph nodes are noted. Other: No ascites or free air. Musculoskeletal: No significant bony findings. IMPRESSION: 1. Duodenal stent in place without complicating  features. 2. No acute abdominal findings, mass lesions or adenopathy. 3. Diffuse and fairly marked fatty infiltration of the liver. Electronically Signed   By: Rudie Meyer M.D.   On: 07/16/2021 14:16   DG Chest Port 1V same Day  Result Date: 07/16/2021 CLINICAL DATA:  Patient is status post duodenal dilatation. EXAM: PORTABLE CHEST 1 VIEW COMPARISON:  None. FINDINGS: Normal cardiac and mediastinal contours. No consolidative pulmonary opacities. No pleural effusion or pneumothorax. IMPRESSION: No acute cardiopulmonary process. Electronically Signed   By: Annia Belt M.D.   On: 07/16/2021 11:34    DG Abd Portable 2V  Result Date: 07/16/2021 CLINICAL DATA:  A 61 year old feet presents for evaluation of suspected bowel perforation, post EGD with abdominal pain. EXAM: PORTABLE ABDOMEN - 2 VIEW COMPARISON:  February 19, 2016. FINDINGS: Relative paucity of gas in the abdomen. Upright view without signs of free air. Abdominal bowel gas pattern is incompletely imaged and body habitus does limit assessment. No gross distension of bowel loops within the mid and upper abdomen. Lung bases are clear. Decubitus radiograph also without signs of pneumoperitoneum. No acute regional skeletal process. IMPRESSION: No signs of pneumoperitoneum. Abdominal bowel gas evaluated in the central and upper abdomen without bowel distension. If there is continued or worsening pain given that the duodenum is largely retroperitoneal could consider CT as warranted for further evaluation. Electronically Signed   By: Donzetta Kohut M.D.   On: 07/16/2021 11:36    Microbiology: Results for orders placed or performed during the hospital encounter of 02/12/16  Surgical pcr screen     Status: None   Collection Time: 02/12/16  3:01 PM   Specimen: Nasal Mucosa; Nasal Swab  Result Value Ref Range Status   MRSA, PCR NEGATIVE NEGATIVE Final   Staphylococcus aureus NEGATIVE NEGATIVE Final    Comment:        The Xpert SA Assay (FDA approved for NASAL specimens in patients over 53 years of age), is one component of a comprehensive surveillance program.  Test performance has been validated by [facility] for patients greater than or equal to 67 year old. It is not intended to diagnose infection nor to guide or monitor treatment.     Labs: CBC: Recent Labs  Lab 07/16/21 1248 07/17/21 0518 07/18/21 0531  WBC 11.0* 11.0* 11.6*  NEUTROABS  --   --  8.6*  HGB 14.0 12.8 12.2  HCT 39.7 37.1 35.3*  MCV 90.4 89.4 91.0  PLT 267 253 204   Basic Metabolic Panel: Recent Labs  Lab 07/16/21 1248 07/17/21 0518  07/18/21 0531  NA 138 133* 133*  K 4.2 3.3* 3.3*  CL 106 102 102  CO2 22 23 23   GLUCOSE 140* 146* 132*  BUN 19 11 11   CREATININE 0.84 0.71 0.55  CALCIUM 9.2 8.5* 8.3*  MG 1.8  --  1.8  PHOS 4.0  --   --    Liver Function Tests: Recent Labs  Lab 07/16/21 1248 07/17/21 0518 07/18/21 0531  AST 32 30 40  ALT 55* 47* 61*  ALKPHOS 56 48 42  BILITOT 0.1* 0.4 0.3  PROT 7.4 7.0 6.6  ALBUMIN 4.4 4.0 3.8   CBG: Recent Labs  Lab 07/17/21 0552 07/17/21 1332 07/17/21 1914 07/18/21 0008 07/18/21 0648  GLUCAP 142* 129* 177* 142* 137*    Discharge time spent: greater than 30 minutes.  Signed: 07/20/21, MD [facility]

## 2021-07-18 NOTE — Telephone Encounter (Signed)
The pt has already been scheduled for 4/6 with Dr Rush Landmark.  The pt has been advised.

## 2021-07-18 NOTE — Telephone Encounter (Signed)
-----   Message from Lemar Lofty., MD sent at 07/18/2021  3:10 PM EST ----- Regarding: RE: Hospital discharge HD, Thanks for the update. Rhema Boyett, please work on scheduling this patient a follow-up in clinic with myself or one of the APP's in 4 to 6 weeks (okay to use a 3:50 PM slot or held slot). Thanks. GM ----- Message ----- From: Sherrilyn Rist, MD Sent: 07/18/2021   1:04 PM EST To: Lemar Lofty., MD Subject: Hospital discharge                             Gabe,  This patient with a duodenal stent removed is going home today and needs outpatient follow-up with you.  HD

## 2021-07-18 NOTE — Clinical Social Work Note (Signed)
°  Transition of Care [facility]) Screening Note   Patient Details  Name: Patricia Hart Date of Birth: February 13, 1961   Transition of Care [facility]) CM/SW Contact:    Ida Rogue, LCSW Phone Number: 07/18/2021, 10:27 AM    Transition of Care [facility]) has reviewed patient and no TOC needs have been identified at this time. We will continue to monitor patient advancement through interdisciplinary progression rounds. If new patient transition needs arise, please place a TOC consult.

## 2021-07-18 NOTE — Plan of Care (Signed)
  Problem: Clinical Measurements: Goal: Ability to maintain clinical measurements within normal limits will improve Outcome: Not Progressing   Problem: Nutrition: Goal: Adequate nutrition will be maintained Outcome: Not Progressing   Problem: Elimination: Goal: Will not experience complications related to bowel motility Outcome: Not Progressing   Problem: Pain Managment: Goal: General experience of comfort will improve Outcome: Not Progressing   

## 2021-07-19 DIAGNOSIS — Z86718 Personal history of other venous thrombosis and embolism: Secondary | ICD-10-CM | POA: Diagnosis not present

## 2021-07-19 DIAGNOSIS — Z803 Family history of malignant neoplasm of breast: Secondary | ICD-10-CM | POA: Diagnosis not present

## 2021-07-19 DIAGNOSIS — T182XXA Foreign body in stomach, initial encounter: Secondary | ICD-10-CM | POA: Diagnosis present

## 2021-07-19 DIAGNOSIS — D72829 Elevated white blood cell count, unspecified: Secondary | ICD-10-CM | POA: Diagnosis present

## 2021-07-19 DIAGNOSIS — Z8371 Family history of colonic polyps: Secondary | ICD-10-CM | POA: Diagnosis not present

## 2021-07-19 DIAGNOSIS — Z8262 Family history of osteoporosis: Secondary | ICD-10-CM | POA: Diagnosis not present

## 2021-07-19 DIAGNOSIS — K760 Fatty (change of) liver, not elsewhere classified: Secondary | ICD-10-CM | POA: Diagnosis present

## 2021-07-19 DIAGNOSIS — Z833 Family history of diabetes mellitus: Secondary | ICD-10-CM | POA: Diagnosis not present

## 2021-07-19 DIAGNOSIS — X58XXXA Exposure to other specified factors, initial encounter: Secondary | ICD-10-CM | POA: Diagnosis present

## 2021-07-19 DIAGNOSIS — E871 Hypo-osmolality and hyponatremia: Secondary | ICD-10-CM | POA: Diagnosis present

## 2021-07-19 DIAGNOSIS — E876 Hypokalemia: Secondary | ICD-10-CM | POA: Diagnosis present

## 2021-07-19 DIAGNOSIS — K315 Obstruction of duodenum: Secondary | ICD-10-CM | POA: Diagnosis not present

## 2021-07-19 DIAGNOSIS — Z800 Family history of malignant neoplasm of digestive organs: Secondary | ICD-10-CM | POA: Diagnosis not present

## 2021-07-19 DIAGNOSIS — M797 Fibromyalgia: Secondary | ICD-10-CM | POA: Diagnosis present

## 2021-07-19 DIAGNOSIS — K449 Diaphragmatic hernia without obstruction or gangrene: Secondary | ICD-10-CM | POA: Diagnosis present

## 2021-07-19 DIAGNOSIS — Z801 Family history of malignant neoplasm of trachea, bronchus and lung: Secondary | ICD-10-CM | POA: Diagnosis not present

## 2021-07-19 DIAGNOSIS — T183XXA Foreign body in small intestine, initial encounter: Secondary | ICD-10-CM | POA: Diagnosis present

## 2021-07-19 DIAGNOSIS — K219 Gastro-esophageal reflux disease without esophagitis: Secondary | ICD-10-CM | POA: Diagnosis present

## 2021-07-19 DIAGNOSIS — Z981 Arthrodesis status: Secondary | ICD-10-CM | POA: Diagnosis not present

## 2021-07-19 DIAGNOSIS — Z83438 Family history of other disorder of lipoprotein metabolism and other lipidemia: Secondary | ICD-10-CM | POA: Diagnosis not present

## 2021-07-19 DIAGNOSIS — R739 Hyperglycemia, unspecified: Secondary | ICD-10-CM | POA: Diagnosis not present

## 2021-07-19 DIAGNOSIS — Z86711 Personal history of pulmonary embolism: Secondary | ICD-10-CM | POA: Diagnosis not present

## 2021-07-19 DIAGNOSIS — R112 Nausea with vomiting, unspecified: Secondary | ICD-10-CM | POA: Diagnosis not present

## 2021-07-19 DIAGNOSIS — R7401 Elevation of levels of liver transaminase levels: Secondary | ICD-10-CM | POA: Diagnosis not present

## 2021-07-19 DIAGNOSIS — Z8249 Family history of ischemic heart disease and other diseases of the circulatory system: Secondary | ICD-10-CM | POA: Diagnosis not present

## 2021-07-19 DIAGNOSIS — Z91040 Latex allergy status: Secondary | ICD-10-CM | POA: Diagnosis not present

## 2021-07-19 LAB — GLUCOSE, CAPILLARY
Glucose-Capillary: 117 mg/dL — ABNORMAL HIGH (ref 70–99)
Glucose-Capillary: 134 mg/dL — ABNORMAL HIGH (ref 70–99)

## 2021-07-19 MED ORDER — METOCLOPRAMIDE HCL 10 MG PO TABS: 10.0000 mg | ORAL_TABLET | Freq: Four times a day (QID) | ORAL | 0 refills | Status: AC

## 2021-07-19 NOTE — Progress Notes (Signed)
° ° ° °  Progress Note    ASSESSMENT AND PLAN:   Duodenal stricture s/p multiple dilatations, S/P cold Axios 2/22, removed 2/24 as she didn't tolerate. No outlet obstruction. Nl GES, UGI series, CT AP   Plan: -Advance diet to soft -Continue current medications including around-the-clock Zofran ODT and Phenergan suppositories as needed. -Can switch to p.o. Reglan 10 QID -Ambulate -If she tolerates p.o., can D/C home today with GI FU with Dr. Rush Landmark. -Consider CT head, HIDA with EF as outpt. -D/W nursing staff and pt in detail     [place] much better Has been able to tolerate p.o. No significant nausea/vomiting Feels like she has "finally turned around" Would like to go home today    OBJECTIVE:     Vital signs in last 24 hours: Temp:  [98.4 F (36.9 C)-99.3 F (37.4 C)] 98.4 F (36.9 C) (02/25 0545) Pulse Rate:  [59-65] 62 (02/25 0545) Resp:  [18-21] 18 (02/25 0545) BP: (155-173)/(89-95) 163/95 (02/25 0545) SpO2:  [97 %-98 %] 97 % (02/25 0545) Last BM Date : 07/16/21 General:   Alert, well-developed female in NAD EENT:  Normal hearing, non icteric sclera, conjunctive pink.  Heart:  Regular rate and rhythm; no murmur.  No lower extremity edema   Pulm: Normal respiratory effort, lungs CTA bilaterally without wheezes or crackles. Abdomen:  Soft, nondistended, nontender.  Normal bowel sounds,.       Neurologic:  Alert and  oriented x4;  grossly normal neurologically. Psych:  Pleasant, cooperative.  Normal mood and affect.   Intake/Output from previous day: 02/24 0701 - 02/25 0700 In: 1673.2 [I.V.:1673.2] Out: 2 [Emesis/NG output:2] Intake/Output this shift: Total I/O In: 338 [P.O.:338] Out: -   Lab Results: Recent Labs    07/17/21 0518 07/18/21 0531  WBC 11.0* 11.6*  HGB 12.8 12.2  HCT 37.1 35.3*  PLT 253 204   BMET Recent Labs    07/17/21 0518 07/18/21 0531  NA 133* 133*  K 3.3* 3.3*  CL 102 102  CO2 23 23  GLUCOSE 146* 132*   BUN 11 11  CREATININE 0.71 0.55  CALCIUM 8.5* 8.3*   LFT Recent Labs    07/18/21 0531  PROT 6.6  ALBUMIN 3.8  AST 40  ALT 61*  ALKPHOS 42  BILITOT 0.3   PT/INR No results for input(s): LABPROT, INR in the last 72 hours. Hepatitis Panel No results for input(s): HEPBSAG, HCVAB, HEPAIGM, HEPBIGM in the last 72 hours.  DG ABD ACUTE 2+V W 1V CHEST  Result Date: 07/18/2021 CLINICAL DATA:  61 year old female with history of vomiting. EXAM: DG ABDOMEN ACUTE WITH 1 VIEW CHEST COMPARISON:  07/16/2021 FINDINGS: There is no evidence of dilated bowel loops or free intraperitoneal air. No radiopaque calculi or other significant radiographic abnormality is seen. Heart size and mediastinal contours are within normal limits. Both lungs are clear. Similar appearance of lumbosacral fusion hardware. IMPRESSION: Nonobstructive bowel gas pattern. No acute cardiopulmonary disease. Electronically Signed   By: Ruthann Cancer M.D.   On: 07/18/2021 13:51     Principal Problem:   Intractable nausea and vomiting Active Problems:   Hyperglycemia   Elevated ALT measurement   Duodenal stricture   Hyponatremia   Hypokalemia   Leukocytosis     LOS: 0 days     Carmell Austria, MD 07/19/2021, 2:04 PM  GI [phone]

## 2021-07-19 NOTE — Progress Notes (Signed)
°  Progress Note   Patient: Patricia Hart YQM:[id number] DOB: Aug 30, 1960 DOA: 07/16/2021     0 DOS: the patient was seen and examined on 07/19/2021   Brief hospital course: 61 y.o. female with medical history significant of osteoarthritis, history of pregnancy-induced DVT, fibromyalgia, GERD, history of PE, overactive bladder, duodenal stricture who underwent an EGD on 07/16/2021 for dilatation possible stent placement for duodenal stricture which was followed by multiple episodes of nausea and emesis while in recovery in the endoscopic suite.  She was subsequently started on IV fluids and kept under observation under hospitalist service.  GI was consulted.  She underwent EGD and stent removal on 07/17/2021.  Subsequently, diet was advanced to soft diet on 07/18/2021 with plans for discharge home but she did not tolerate soft diet and vomited.  Discharge held.  Switched back to full liquid diet.  Assessment and Plan: * Intractable nausea and vomiting- (present on admission) - Treated with IV Protonix, antiemetics and fluids. -Status post EGD and stent removal on 07/17/2021. -Subsequently, diet was advanced to soft diet on 07/18/2021 with plans for discharge home but she did not tolerate soft diet and vomited.  Discharge held.  Switched back to full liquid diet. -Still complains of intermittent nausea with vomiting.  Will continue with full liquid diet and advance to soft diet later today if tolerated.  Continue antiemetics.  Duodenal stricture - Status post duodenal stent placement by GI on 07/16/2021.  Repeat CT scan after procedure did not show any perforation. -Status post EGD and removal of duodenal stent on 07/17/2021 by GI.  Outpatient follow-up with GI.  Hyponatremia - Mild.  No labs today.  Currently on IV fluids.  Repeat a.m. labs.  Hypokalemia - No labs today.  Repeat a.m. labs..  Leukocytosis - Possibly reactive.   Elevated ALT measurement- (present on admission) - Mild.  Outpatient  follow-up with GI.  Hyperglycemia- (present on admission) - Probably reactive.  Outpatient follow-up with PCP.        Subjective:  Patient seen and examined at bedside.  Still feels nauseous with intermittent vomiting.  Denies worsening abdominal pain, shortness of breath or chest pain.  Physical Exam: Vitals:   07/18/21 0340 07/18/21 1415 07/18/21 2012 07/19/21 0545  BP: (!) 157/91 (!) 173/89 (!) 155/90 (!) 163/95  Pulse: 64 65 (!) 59 62  Resp: 18 20 (!) 21 18  Temp: 98.6 F (37 C) 99.3 F (37.4 C) 98.7 F (37.1 C) 98.4 F (36.9 C)  TempSrc: Oral Oral Oral Oral  SpO2: 95% 98% 97% 97%  Weight:      Height:       General: On room air.  No distress. respiratory: Decreased breath sounds at bases bilaterally, no wheezing CVS: Intermittently bradycardic; S1-S2 heard  abdominal: Soft, nontender, slightly distended; no organomegaly; normal bowel sounds heard  extremities: No cyanosis, clubbing, edema  Data Reviewed: No new labs today  Family Communication: None at bedside Disposition: Status is: Inpatient Remains inpatient appropriate because: Of persistent vomiting and need for IV fluids    Planned Discharge Destination: Home     Time spent: 35 minutes  Author: Glade Lloyd, MD 07/19/2021 10:28 AM  For on call review [email or web address].

## 2021-07-19 NOTE — Plan of Care (Signed)
  Problem: Nutrition: Goal: Adequate nutrition will be maintained Outcome: Not Progressing   

## 2021-07-19 NOTE — Progress Notes (Signed)
Pt PIV removed. VSS. AVS printed and educational teaching completed with teach back method. Pt has all belongings. Pt tolerated 80% of meal.Pt denied any nausea and vomiting.

## 2021-07-20 LAB — GLUCOSE, CAPILLARY: Glucose-Capillary: 121 mg/dL — ABNORMAL HIGH (ref 70–99)

## 2021-08-27 ENCOUNTER — Encounter: Payer: Self-pay | Admitting: Gastroenterology

## 2021-08-27 ENCOUNTER — Ambulatory Visit (INDEPENDENT_AMBULATORY_CARE_PROVIDER_SITE_OTHER): Payer: BC Managed Care – PPO | Admitting: Gastroenterology

## 2021-08-27 VITALS — BP 110/70 | HR 92 | Ht 64.0 in | Wt 146.0 lb

## 2021-08-27 DIAGNOSIS — K315 Obstruction of duodenum: Secondary | ICD-10-CM

## 2021-08-27 DIAGNOSIS — R1013 Epigastric pain: Secondary | ICD-10-CM | POA: Diagnosis not present

## 2021-08-27 DIAGNOSIS — G8929 Other chronic pain: Secondary | ICD-10-CM

## 2021-08-27 DIAGNOSIS — K9041 Non-celiac gluten sensitivity: Secondary | ICD-10-CM | POA: Diagnosis not present

## 2021-08-27 DIAGNOSIS — R933 Abnormal findings on diagnostic imaging of other parts of digestive tract: Secondary | ICD-10-CM

## 2021-08-27 DIAGNOSIS — R1114 Bilious vomiting: Secondary | ICD-10-CM

## 2021-08-27 DIAGNOSIS — Z8371 Family history of colonic polyps: Secondary | ICD-10-CM

## 2021-08-27 DIAGNOSIS — Z800 Family history of malignant neoplasm of digestive organs: Secondary | ICD-10-CM

## 2021-08-27 DIAGNOSIS — R112 Nausea with vomiting, unspecified: Secondary | ICD-10-CM

## 2021-08-27 MED ORDER — RABEPRAZOLE SODIUM 20 MG PO TBEC
20.0000 mg | DELAYED_RELEASE_TABLET | Freq: Two times a day (BID) | ORAL | 1 refills | Status: DC
Start: 2021-08-27 — End: 2022-01-14

## 2021-08-27 NOTE — Patient Instructions (Signed)
Stop Protonix.  ? ?Start Aciphex.  ? ?We have sent the following medications to your pharmacy for you to pick up at your convenience: ?Aciphex  ? ?If you are age 60 or older, your body mass index should be between 23-30. Your Body mass index is 25.06 kg/m?Marland Kitchen If this is out of the aforementioned range listed, please consider follow up with your Primary Care Provider. ? ?If you are age 55 or younger, your body mass index should be between 19-25. Your Body mass index is 25.06 kg/m?Marland Kitchen If this is out of the aformentioned range listed, please consider follow up with your Primary Care Provider.  ? ?________________________________________________________ ? ?The [place] [facility] providers would like to encourage you to use [facility] to communicate with providers for non-urgent requests or questions.  Due to long hold times on the telephone, sending your provider a message by [facility] may be a faster and more efficient way to get a response.  Please allow 48 business hours for a response.  Please remember that this is for non-urgent requests.  ?_______________________________________________________ ? ?Due to recent changes in healthcare laws, you may see the results of your imaging and laboratory studies on MyChart before your provider has had a chance to review them.  We understand that in some cases there may be results that are confusing or concerning to you. Not all laboratory results come back in the same time frame and the provider may be waiting for multiple results in order to interpret others.  Please give [place] 48 hours in order for your provider to thoroughly review all the results before contacting the office for clarification of your results.  ? ?Thank you for choosing me and  Gastroenterology. ? ?Dr. Meridee Score  ? ?

## 2021-08-27 NOTE — Progress Notes (Signed)
? ?GASTROENTEROLOGY OUTPATIENT CLINIC VISIT  ? ?Primary Care Provider ?Irena Reichmann, DO ?[address] ?[place] [address] ?[phone] ? ?Patient Profile: ?Patricia Hart is a 61 y.o. 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  The patient presents to the [facility] for an evaluation and management of problem(s) noted below: ? ?Problem List ?1. Duodenal stenosis   ?2. Bilious vomiting with nausea   ?3. Abdominal pain, chronic, epigastric   ?4. NCGS (non-celiac gluten sensitivity)   ?5. Abnormal endoscopy of upper gastrointestinal tract   ?6. Family history of colon cancer in mother   ?7. Family history of colonic polyps   ? ? ? ?History of Present Illness ?Please see prior notes for full details of HPI. ? ?Interval History ?The patient is seen in follow-up today. After multiple endoscopies and endoscopic dilations I decided in February to attempt the use of an AXIOS LAMS stent in an effort of trying to aid in duodenal stricture dilation.  Unfortunately, the patient had significant abdominal discomfort and pain with persistent nausea and vomiting after the stent was placed.  We attempted to hospitalize her in an effort of trying to see if the stent would expand and then allow the patient to have it in place for a few weeks.  Within 24 hours, she could not tolerate it.  This was removed.  Once removed it was easily seen that the stent was in good position and there was no evidence of any perforation or abnormality in the therapeutic endoscope was able to pass quite easily.  Patient was eventually discharged.  She follows up today and states that she has not required any antiemetics over the course of the last month.  She still has episodes of  abdominal pain.  She still has episodes that if she eats too significant mount of foodstuffs that she may get a feeling of bloating and discomfort.  It is better than where things were previously.  She is making an alteration of her diet in regards to simple carbs as well as gluten.  She has previously been biopsied and tested for gluten/celiac disease and she does not have celiac disease based on biopsies and serologies.  Patient has been gaining weight. ? ?GI Review of Systems ?Positive as above including bloating ?Negative for alteration of bowel habits, melena, hematochezia ? ?Review of Systems ?General: Denies fevers/chills/unintentional weight loss ?Cardiovascular: Denies chest pain ?Pulmonary: Denies shortness of breath ?Gastroenterological: See HPI ?Genitourinary: Denies darkened urine ?Hematological: Denies easy bruising/bleeding ?Dermatological: Denies jaundice ?Psychological: Mood is stable ? ? ?Medications ?Current Outpatient Medications  ?Medication Sig Dispense Refill  ? acetaminophen (TYLENOL) 500 MG tablet Take 1,500 mg by mouth every 6 (six) hours as needed for mild pain.    ? b complex vitamins capsule Take 1 capsule by mouth daily.    ? diclofenac Sodium (VOLTAREN) 1 % GEL Apply 1 application topically daily.    ? guaiFENesin (MUCINEX) 600 MG 12 hr tablet Take 600 mg by mouth daily as needed (Allergies).    ? Multiple Vitamins-Minerals (MULTIVITAMIN PO) Take 1 tablet by mouth daily.    ? ondansetron (ZOFRAN) 4 MG tablet Take 1 tablet (4 mg total) by mouth every 4 (four) hours as needed for nausea. 20 tablet 0  ? prochlorperazine (COMPAZINE) 25 MG suppository Place 1 suppository (25 mg total) rectally  every 6 (six) hours as needed for nausea or vomiting. If oral anti-nausea medication is not helping 12 suppository 0  ? promethazine (PHENERGAN) 12.5 MG tablet Take 1 tablet (12.5 mg total) by mouth every 6 (six) hours as needed for nausea or vomiting. 30 tablet 2  ? RABEprazole (ACIPHEX) 20 MG tablet  Take 1 tablet (20 mg total) by mouth 2 (two) times daily. 180 tablet 1  ? sucralfate (CARAFATE) 1 g tablet Take 1 tablet (1 g total) by mouth 4 (four) times daily -  with meals and at bedtime. 120 tablet 11  ? metoCLOPramide (REGLAN) 10 MG tablet Take 1 tablet (10 mg total) by mouth 4 (four) times daily for 8 days. (Patient not taking: Reported on 08/27/2021) 30 tablet 0  ? ?No current facility-administered medications for this visit.  ? ? ?Allergies ?Allergies  ?Allergen Reactions  ? Latex Other (See Comments)  ?  Skin irritation  ? ? ?Histories ?Past Medical History:  ?Diagnosis Date  ? Arthritis   ? Blood transfusion without reported diagnosis 1997  ? Clotting disorder (HCC) 1997  ? pregnancy induced dvt- no problems since then  ? Fibromyalgia   ? GERD (gastroesophageal reflux disease)   ? Hepatic steatosis 07/16/2021  ? History of DVT (deep vein thrombosis)   ? during pregnancy  ? History of pulmonary embolism   ? occurred during pregnancy  ? Overactive bladder   ? ?Past Surgical History:  ?Procedure Laterality Date  ? ABDOMINAL EXPOSURE N/A 02/19/2016  ? Procedure: ABDOMINAL EXPOSURE;  Surgeon: Larina Earthlyodd F Early, MD;  Location: [facility];  Service: Vascular;  Laterality: N/A;  ? ABLATION  2006  ? endometrial  ? ANTERIOR LUMBAR FUSION N/A 02/19/2016  ? Procedure: Anterior Lumbar Interbody Fusion  - Lumbar five-sacral one;  Surgeon: Tia Alertavid S Jones, MD;  Location: [facility];  Service: Neurosurgery;  Laterality: N/A;  ? BACK SURGERY    ? BALLOON DILATION N/A 03/17/2021  ? Procedure: BALLOON DILATION;  Surgeon: Lemar LoftyMansouraty, Vanita Cannell Jr., MD;  Location: [facility];  Service: Gastroenterology;  Laterality: N/A;  ? BALLOON DILATION N/A 04/21/2021  ? Procedure: BALLOON DILATION;  Surgeon: Lemar LoftyMansouraty, Michaelah Credeur Jr., MD;  Location: [facility];  Service: Gastroenterology;  Laterality: N/A;  ? BIOPSY  03/17/2021  ? Procedure: BIOPSY;  Surgeon: Lemar LoftyMansouraty, Jimmey Hengel Jr., MD;  Location: [facility];  Service: Gastroenterology;;  ?  breast implants    ? saline  ? CARPAL TUNNEL RELEASE Right 04/23/2015  ? Procedure: RIGHT CARPAL TUNNEL RELEASE;  Surgeon: Cindee SaltGary Kuzma, MD;  Location: [facility];  Service: Orthopedics;  Laterality: Right;  ? CARPAL TUNNEL RELEASE Left 05/16/2015  ? Procedure: LEFT CARPAL TUNNEL RELEASE;  Surgeon: Cindee SaltGary Kuzma, MD;  Location: [facility];  Service: Orthopedics;  Laterality: Left;  ? COLONIC STENT PLACEMENT N/A 07/16/2021  ? Procedure: AXIOS STENT PLACEMENT;  Surgeon: Lemar LoftyMansouraty, Makenzye Troutman Jr., MD;  Location: [facility];  Service: Gastroenterology;  Laterality: N/A;  ? ESOPHAGOGASTRODUODENOSCOPY N/A 07/17/2021  ? Procedure: ESOPHAGOGASTRODUODENOSCOPY (EGD);  Surgeon: Rachael FeeJacobs, Daniel P, MD;  Location: [facility];  Service: Gastroenterology;  Laterality: N/A;  ? ESOPHAGOGASTRODUODENOSCOPY (EGD) WITH PROPOFOL N/A 03/17/2021  ? Procedure: ESOPHAGOGASTRODUODENOSCOPY (EGD) WITH PROPOFOL;  Surgeon: Meridee ScoreMansouraty, Netty StarringGabriel Jr., MD;  Location: [facility];  Service: Gastroenterology;  Laterality: N/A;  Fluoro  ? ESOPHAGOGASTRODUODENOSCOPY (EGD) WITH PROPOFOL N/A 04/21/2021  ? Procedure: ESOPHAGOGASTRODUODENOSCOPY (EGD) WITH PROPOFOL;  Surgeon: Meridee ScoreMansouraty, Netty StarringGabriel Jr., MD;  Location: [facility];  Service: Gastroenterology;  Laterality: N/A;  fluoro  ?  ESOPHAGOGASTRODUODENOSCOPY (EGD) WITH PROPOFOL N/A 05/16/2021  ? Procedure: ESOPHAGOGASTRODUODENOSCOPY (EGD) WITH PROPOFOL;  Surgeon: Meridee Score Netty Starring., MD;  Location[facility];  Service: Gastroenterology;  Laterality: N/A;  ? ESOPHAGOGASTRODUODENOSCOPY (EGD) WITH PROPOFOL N/A 07/16/2021  ? Procedure: ESOPHAGOGASTRODUODENOSCOPY (EGD) WITH PROPOFOL;  Surgeon: Meridee Score Netty Starring., MD;  Location: [facility];  Service: Gastroenterology;  Laterality: N/A;  ? FOREIGN BODY REMOVAL  07/16/2021  ? Procedure: FOREIGN BODY REMOVAL;  Surgeon: Meridee Score Netty Starring., MD;  Location: [facility];  Service: Gastroenterology;;  ? STENT REMOVAL  07/17/2021  ?  Procedure: STENT REMOVAL;  Surgeon: Rachael Fee, MD;  Location: [facility];  Service: Gastroenterology;;  ? TONSILLECTOMY    ? TRIGGER FINGER RELEASE Right 04/23/2015  ? Procedure: RELEASE TRIGGER

## 2021-08-29 ENCOUNTER — Encounter: Payer: Self-pay | Admitting: Gastroenterology

## 2021-08-29 DIAGNOSIS — R1114 Bilious vomiting: Secondary | ICD-10-CM | POA: Insufficient documentation

## 2021-08-29 DIAGNOSIS — R933 Abnormal findings on diagnostic imaging of other parts of digestive tract: Secondary | ICD-10-CM | POA: Insufficient documentation

## 2021-08-29 DIAGNOSIS — K9041 Non-celiac gluten sensitivity: Secondary | ICD-10-CM | POA: Insufficient documentation

## 2021-10-15 ENCOUNTER — Encounter: Payer: Self-pay | Admitting: Gastroenterology

## 2021-10-15 ENCOUNTER — Other Ambulatory Visit (INDEPENDENT_AMBULATORY_CARE_PROVIDER_SITE_OTHER): Payer: BC Managed Care – PPO

## 2021-10-15 ENCOUNTER — Ambulatory Visit (INDEPENDENT_AMBULATORY_CARE_PROVIDER_SITE_OTHER): Payer: BC Managed Care – PPO | Admitting: Gastroenterology

## 2021-10-15 VITALS — BP 116/72 | HR 97 | Ht 64.0 in | Wt 141.0 lb

## 2021-10-15 DIAGNOSIS — R198 Other specified symptoms and signs involving the digestive system and abdomen: Secondary | ICD-10-CM | POA: Diagnosis not present

## 2021-10-15 DIAGNOSIS — R7989 Other specified abnormal findings of blood chemistry: Secondary | ICD-10-CM

## 2021-10-15 DIAGNOSIS — R5383 Other fatigue: Secondary | ICD-10-CM | POA: Diagnosis not present

## 2021-10-15 DIAGNOSIS — Z1211 Encounter for screening for malignant neoplasm of colon: Secondary | ICD-10-CM

## 2021-10-15 DIAGNOSIS — R110 Nausea: Secondary | ICD-10-CM

## 2021-10-15 DIAGNOSIS — K315 Obstruction of duodenum: Secondary | ICD-10-CM

## 2021-10-15 DIAGNOSIS — Z862 Personal history of diseases of the blood and blood-forming organs and certain disorders involving the immune mechanism: Secondary | ICD-10-CM | POA: Diagnosis not present

## 2021-10-15 DIAGNOSIS — E876 Hypokalemia: Secondary | ICD-10-CM

## 2021-10-15 DIAGNOSIS — Z800 Family history of malignant neoplasm of digestive organs: Secondary | ICD-10-CM

## 2021-10-15 LAB — CBC
HCT: 42.9 % (ref 36.0–46.0)
Hemoglobin: 14.5 g/dL (ref 12.0–15.0)
MCHC: 33.8 g/dL (ref 30.0–36.0)
MCV: 91.5 fl (ref 78.0–100.0)
Platelets: 251 10*3/uL (ref 150.0–400.0)
RBC: 4.68 Mil/uL (ref 3.87–5.11)
RDW: 14.6 % (ref 11.5–15.5)
WBC: 8.7 10*3/uL (ref 4.0–10.5)

## 2021-10-15 LAB — IBC + FERRITIN
Ferritin: 61.3 ng/mL (ref 10.0–291.0)
Iron: 61 ug/dL (ref 42–145)
Saturation Ratios: 17.9 % — ABNORMAL LOW (ref 20.0–50.0)
TIBC: 340.2 ug/dL (ref 250.0–450.0)
Transferrin: 243 mg/dL (ref 212.0–360.0)

## 2021-10-15 LAB — VITAMIN B12: Vitamin B-12: >1504 pg/mL — ABNORMAL HIGH (ref 211–911)

## 2021-10-15 LAB — COMPREHENSIVE METABOLIC PANEL
ALT: 34 U/L (ref 0–35)
AST: 26 U/L (ref 0–37)
Albumin: 4.5 g/dL (ref 3.5–5.2)
Alkaline Phosphatase: 50 U/L (ref 39–117)
BUN: 17 mg/dL (ref 6–23)
CO2: 24 mEq/L (ref 19–32)
Calcium: 9.9 mg/dL (ref 8.4–10.5)
Chloride: 102 mEq/L (ref 96–112)
Creatinine, Ser: 0.85 mg/dL (ref 0.40–1.20)
GFR: 74.89 mL/min (ref >60.00)
Glucose, Bld: 103 mg/dL — ABNORMAL HIGH (ref 70–99)
Potassium: 3.8 mEq/L (ref 3.5–5.1)
Sodium: 136 mEq/L (ref 135–145)
Total Bilirubin: 0.3 mg/dL (ref 0.2–1.2)
Total Protein: 7.2 g/dL (ref 6.0–8.3)

## 2021-10-15 LAB — CORTISOL: Cortisol, Plasma: 5.1 ug/dL

## 2021-10-15 LAB — FOLATE: Folate: >24.2 ng/mL (ref >5.9)

## 2021-10-15 LAB — TSH: TSH: 1.17 u[IU]/mL (ref 0.35–5.50)

## 2021-10-15 NOTE — Patient Instructions (Addendum)
Your provider has requested that you go to the basement level for lab work before leaving today. Press "B" on the elevator. The lab is located at the first door on the left as you exit the elevator.   You will need EGD in August. Office will contact you to schedule when schedule is available.   If you are age 60 or older, your body mass index should be between 23-30. Your Body mass index is 24.2 kg/m. If this is out of the aforementioned range listed, please consider follow up with your Primary Care Provider.  If you are age 21 or younger, your body mass index should be between 19-25. Your Body mass index is 24.2 kg/m. If this is out of the aformentioned range listed, please consider follow up with your Primary Care Provider.   ________________________________________________________  The [facility] providers would like to encourage you to use [facility] to communicate with providers for non-urgent requests or questions.  Due to long hold times on the telephone, sending your provider a message by [facility] may be a faster and more efficient way to get a response.  Please allow 48 business hours for a response.  Please remember that this is for non-urgent requests.  _______________________________________________________  Thank you for choosing me and [facility].  Dr. Meridee Score

## 2021-10-17 ENCOUNTER — Other Ambulatory Visit (HOSPITAL_COMMUNITY): Payer: Self-pay

## 2021-10-17 ENCOUNTER — Telehealth: Payer: Self-pay | Admitting: Pharmacy Technician

## 2021-10-17 NOTE — Telephone Encounter (Signed)
Patient Advocate Encounter  Received notification from [place] that prior authorization for RABEPRAZOLE 20MG  is required.   PA submitted on 5.26.23 Key [id number]  Status is pending   [facility] will continue to follow  Luciano Cutter, CPhT Patient Advocate Phone: [phone]

## 2021-10-19 ENCOUNTER — Encounter: Payer: Self-pay | Admitting: Gastroenterology

## 2021-10-19 DIAGNOSIS — R7989 Other specified abnormal findings of blood chemistry: Secondary | ICD-10-CM | POA: Insufficient documentation

## 2021-10-19 DIAGNOSIS — R198 Other specified symptoms and signs involving the digestive system and abdomen: Secondary | ICD-10-CM | POA: Insufficient documentation

## 2021-10-19 DIAGNOSIS — R110 Nausea: Secondary | ICD-10-CM | POA: Insufficient documentation

## 2021-10-19 DIAGNOSIS — R5383 Other fatigue: Secondary | ICD-10-CM | POA: Insufficient documentation

## 2021-10-19 DIAGNOSIS — Z862 Personal history of diseases of the blood and blood-forming organs and certain disorders involving the immune mechanism: Secondary | ICD-10-CM | POA: Insufficient documentation

## 2021-10-19 DIAGNOSIS — Z1211 Encounter for screening for malignant neoplasm of colon: Secondary | ICD-10-CM | POA: Insufficient documentation

## 2021-10-19 NOTE — Progress Notes (Signed)
[place] VISIT   Primary Care Provider Janie Morning, [place] [id number] [phone]  Patient Profile: Patricia Hart is a 61 y.o. 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  The patient presents to the [facility] for an evaluation and management of problem(s) noted below:  Problem List 1. Duodenal stricture   2. Abnormal findings on esophagogastroduodenoscopy (EGD)   3. Nausea   4. History of anemia   5. Other fatigue   6. Hypokalemia   7. Abnormal LFTs   8. Family history of colon cancer in mother   90. Colon cancer screening     History of Present Illness Please see prior notes for full details of HPI.  Interval History The patient is seen in follow-up today.  Patient since our last visit has been doing relatively well.  She still has periods of nausea but has not had vomiting.  We had tried to transition her to Aciphex but she states that the pharmacy said there were some insurance issues, though we never heard of need for prior authorization or issues.  She is going to follow-up with pharmacy and then let us know if there are other issues.  She continues on Protonix at this time.  She continues on Carafate twice daily.  Biggest issue for her at this point is overall fatigue and decreased energy that is just been persisting since the hospitalization.  She has continued to make an adjustment in the diet that she follows.  She is trying to stay away from simple carbohydrates as well as gluten.  This has helped her overall.  Patient continues to have some weight gain though she would rather stay stable.  She has not had alteration of her bowel habits.  GI Review of  Systems Positive as above including less bloating Negative for melena, hematochezia   Review of Systems General: Denies fevers/chills/unintentional weight loss Cardiovascular: Denies chest pain Pulmonary: Denies shortness of breath Gastroenterological: See HPI Genitourinary: Denies darkened urine Hematological: Denies easy bruising/bleeding Dermatological: Denies jaundice Psychological: Mood is stable   Medications Current Outpatient Medications  Medication Sig Dispense Refill   acetaminophen (TYLENOL) 500 MG tablet Take 1,500 mg by mouth every 6 (six) hours as needed for mild pain.     b complex vitamins capsule Take 1 capsule by mouth daily.     diclofenac Sodium (VOLTAREN) 1 % GEL Apply 1 application topically daily.     guaiFENesin (MUCINEX) 600 MG 12 hr tablet Take 600 mg by mouth daily as needed (Allergies).     Multiple Vitamins-Minerals (MULTIVITAMIN PO) Take 1 tablet by mouth daily.     pantoprazole (PROTONIX) 40 MG tablet Take 40 mg by mouth 2 (two) times daily.     sucralfate (CARAFATE) 1 g tablet Take 1 tablet (1 g total) by mouth 4 (four) times daily -  with meals and at bedtime. 120 tablet 11   metoCLOPramide (REGLAN) 10 MG tablet Take 1 tablet (10 mg total) by mouth 4 (four) times daily for 8 days. (Patient not taking: Reported on 08/27/2021) 30 tablet 0   ondansetron (ZOFRAN) 4 MG tablet Take 1 tablet (4 mg total) by mouth every 4 (four) hours as needed for nausea. (Patient not taking: Reported on 10/15/2021) 20 tablet 0   prochlorperazine (COMPAZINE) 25 MG suppository Place  1 suppository (25 mg total) rectally every 6 (six) hours as needed for nausea or vomiting. If oral anti-nausea medication is not helping (Patient not taking: Reported on 10/15/2021) 12 suppository 0   promethazine (PHENERGAN) 12.5 MG tablet Take 1 tablet (12.5 mg total) by mouth every 6 (six) hours as needed for nausea or vomiting. (Patient not taking: Reported on 10/15/2021) 30 tablet 2   RABEprazole  (ACIPHEX) 20 MG tablet Take 1 tablet (20 mg total) by mouth 2 (two) times daily. (Patient not taking: Reported on 10/15/2021) 180 tablet 1   No current facility-administered medications for this visit.    Allergies Allergies  Allergen Reactions   Latex Other (See Comments)    Skin irritation    Histories Past Medical History:  Diagnosis Date   Arthritis    Blood transfusion without reported diagnosis 1997   Clotting disorder ([place]) 1997   pregnancy induced dvt- no problems since then   Fibromyalgia    GERD (gastroesophageal reflux disease)    Hepatic steatosis 07/16/2021   History of DVT (deep vein thrombosis)    during pregnancy   History of pulmonary embolism    occurred during pregnancy   Overactive bladder    Past Surgical History:  Procedure Laterality Date   ABDOMINAL EXPOSURE N/A 02/19/2016   Procedure: ABDOMINAL EXPOSURE;  Surgeon: Rosetta Posner, MD;  Location: [facility];  Service: Vascular;  Laterality: N/A;   ABLATION  2006   endometrial   ANTERIOR LUMBAR FUSION N/A 02/19/2016   Procedure: Anterior Lumbar Interbody Fusion  - Lumbar five-sacral one;  Surgeon: Eustace Moore, MD;  Location: [facility];  Service: Neurosurgery;  Laterality: N/A;   BACK SURGERY     BALLOON DILATION N/A 03/17/2021   Procedure: BALLOON DILATION;  Surgeon: Mansouraty, Telford Nab., MD;  Location: [facility];  Service: Gastroenterology;  Laterality: N/A;   BALLOON DILATION N/A 04/21/2021   Procedure: BALLOON DILATION;  Surgeon: Rush Landmark Telford Nab., MD;  Location: [facility];  Service: Gastroenterology;  Laterality: N/A;   BIOPSY  03/17/2021   Procedure: BIOPSY;  Surgeon: Rush Landmark Telford Nab., MD;  Location: [facility];  Service: Gastroenterology;;   breast implants     saline   CARPAL TUNNEL RELEASE Right 04/23/2015   Procedure: RIGHT CARPAL TUNNEL RELEASE;  Surgeon: Daryll Brod, MD;  Location: [facility];  Service: Orthopedics;  Laterality: Right;   CARPAL  TUNNEL RELEASE Left 05/16/2015   Procedure: LEFT CARPAL TUNNEL RELEASE;  Surgeon: Daryll Brod, MD;  Location: [facility];  Service: Orthopedics;  Laterality: Left;   COLONIC STENT PLACEMENT N/A 07/16/2021   Procedure: AXIOS STENT PLACEMENT;  Surgeon: Irving Copas., MD;  Location: [facility];  Service: Gastroenterology;  Laterality: N/A;   ESOPHAGOGASTRODUODENOSCOPY N/A 07/17/2021   Procedure: ESOPHAGOGASTRODUODENOSCOPY (EGD);  Surgeon: Milus Banister, MD;  Location: [facility];  Service: Gastroenterology;  Laterality: N/A;   ESOPHAGOGASTRODUODENOSCOPY (EGD) WITH PROPOFOL N/A 03/17/2021   Procedure: ESOPHAGOGASTRODUODENOSCOPY (EGD) WITH PROPOFOL;  Surgeon: Rush Landmark Telford Nab., MD;  Location: [facility];  Service: Gastroenterology;  Laterality: N/A;  Fluoro   ESOPHAGOGASTRODUODENOSCOPY (EGD) WITH PROPOFOL N/A 04/21/2021   Procedure: ESOPHAGOGASTRODUODENOSCOPY (EGD) WITH PROPOFOL;  Surgeon: Rush Landmark Telford Nab., MD;  Location: [facility];  Service: Gastroenterology;  Laterality: N/A;  fluoro   ESOPHAGOGASTRODUODENOSCOPY (EGD) WITH PROPOFOL N/A 05/16/2021   Procedure: ESOPHAGOGASTRODUODENOSCOPY (EGD) WITH PROPOFOL;  Surgeon: Rush Landmark Telford Nab., MD;  Location: [facility];  Service: Gastroenterology;  Laterality: N/A;   ESOPHAGOGASTRODUODENOSCOPY (EGD) WITH PROPOFOL N/A  07/16/2021   Procedure: ESOPHAGOGASTRODUODENOSCOPY (EGD) WITH PROPOFOL;  Surgeon: Rush Landmark Telford Nab., MD;  Location: [facility];  Service: Gastroenterology;  Laterality: N/A;   FOREIGN BODY REMOVAL  07/16/2021   Procedure: FOREIGN BODY REMOVAL;  Surgeon: Rush Landmark Telford Nab., MD;  Location: [facility];  Service: Gastroenterology;;   Lavell Islam REMOVAL  07/17/2021   Procedure: STENT REMOVAL;  Surgeon: Milus Banister, MD;  Location: [facility];  Service: Gastroenterology;;   TONSILLECTOMY     TRIGGER FINGER RELEASE Right 04/23/2015   Procedure: RELEASE TRIGGER FINGER/A-1 PULLEY RIGHT  THUMB;  Surgeon: Daryll Brod, MD;  Location: [facility];  Service: Orthopedics;  Laterality: Right;   WISDOM TOOTH EXTRACTION     Social History   Socioeconomic History   Marital status: Divorced    Spouse name: Not on file   Number of children: Not on file   Years of education: Not on file   Highest education level: Not on file  Occupational History   Not on file  Tobacco Use   Smoking status: Never   Smokeless tobacco: Never  Vaping Use   Vaping Use: Never used  Substance and Sexual Activity   Alcohol use: Yes    Alcohol/week: 6.0 standard drinks    Types: 4 Glasses of wine, 2 Standard drinks or equivalent per week    Comment: occ (currently none due to stomach issues 01/29/21)   Drug use: No   Sexual activity: Not Currently    Partners: Male    Birth control/protection: Surgical    Comment: Ablation  Other Topics Concern   Not on file  Social History Narrative   Not on file   Social Determinants of Health   Financial Resource Strain: Not on file  Food Insecurity: Not on file  Transportation Needs: Not on file  Physical Activity: Not on file  Stress: Not on file  Social Connections: Not on file  Intimate Partner Violence: Not on file   Family History  Problem Relation Age of Onset   Colon polyps Mother    Hyperlipidemia Mother    Colon cancer Mother        in her 71s or 59s   Osteoporosis Mother    Lung cancer Mother    Diabetes Father    Hyperlipidemia Father    Hypertension Father    Breast cancer Maternal Aunt        50's   Pancreatic cancer Neg Hx    Stomach cancer Neg Hx    Liver disease Neg Hx    Inflammatory bowel disease Neg Hx    Esophageal cancer Neg Hx    I have reviewed her medical, social, and family history in detail and updated the electronic medical record as necessary.    PHYSICAL EXAMINATION  BP 116/72   Pulse 97   Ht _0  (1.626 m)   Wt 141 lb (64 kg)   BMI 24.20 kg/m  Wt Readings from Last 3 Encounters:   10/15/21 141 lb (64 kg)  08/27/21 146 lb (66.2 kg)  07/17/21 130 lb 1.1 oz (59 kg)  GEN: NAD, appears stated age, doesn't appear chronically ill PSYCH: Cooperative, without pressured speech EYE: Conjunctivae pink, sclerae anicteric ENT: MMM CV: Nontachycardic RESP: No audible wheezing GI: NABS, soft, minimal TTP in MEG, nondistended, without rebound MSK/EXT: No lower extremity edema SKIN: No jaundice NEURO:  Alert & Oriented x 3, no focal deficits   REVIEW OF DATA  I reviewed the following data at the time of this  encounter:  GI Procedures and Studies  Previously reviewed  Laboratory Studies  Reviewed those in epic and care everywhere  Imaging Studies  No new imaging studies to review   ASSESSMENT  Ms. Strohm is a 61 y.o. 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  The patient is seen today for evaluation and management of:  1. Duodenal stricture   2. Abnormal findings on esophagogastroduodenoscopy (EGD)   3. Nausea   4. History of anemia   5. Other fatigue   6. Hypokalemia   7. Abnormal LFTs   8. Family history of colon cancer in mother   68. Colon cancer screening    The patient is hemodynamically and clinically stable.  It seems that she has continued to have some benefit from the recent AXIOS stent placement and dilation.  She continues on her medications.  Hopefully we can transition her to Aciphex if there is any need from a prior authorization standpoint, hopefully the pharmacy will let us know otherwise she will continue on Protonix for now.  She will continue on her Carafate at this time as well.  She will continue to maintain her current diet as well as try to eat more frequent small meals and effort of trying to maintain her weight  and symptoms overall.  We will plan a follow-up endoscopy to follow-up where things stand in regards to the area in the coming months so that we can stay on top of things and hopefully not have recurrent significant issues.  We will proceed with a dilation as necessary.  Patient has high risk family history and will be due for colon cancer screening this year.  We will tentatively plan the patient for her endoscopy, but we will reach out to her and insurance in the coming weeks to see if she wants to try to do her colonoscopy at the same time or if she wants to do her colonoscopy separately later this year or early next year.  We also have to get insurance approval depending on what she decides.  She is going to follow-up with her primary care provider in regards to her issues of fatigue and decreased energy, but I will begin a work-up to evaluate for potential common reason/etiologies of those symptoms to try and help expedite her work-up.  All patient questions were answered to the best of my ability, and the patient agrees to the aforementioned plan of action with follow-up as indicated.   PLAN  Continue PPI twice daily Patient will follow-up with pharmacy to see if she can transition to Aciphex twice daily or if we end up needing to do a prior authorization Continue Carafate twice daily Continue current nutritional modification as well as gastroparesis-like diet as you are doing Patient will try to minimize gluten due to her "gluten sensitivity" Proceed with scheduling EGD with potential repeat dilation of duodenal stricture in the coming months Recommend patient undergo colonoscopy due to her significant family history and we will see if she wants to do that at the same time as endoscopy and if we get insurance approval Consider SIBO evaluation in future Laboratory evaluation as outlined below Follow-up with PCP in regards to further work-up of fatigue   Orders Placed This Encounter  Procedures    CBC   Comp Met (CMET)   TSH   Cortisol  IBC + Ferritin   B12   Folate    New Prescriptions   No medications on file   Modified Medications   No medications on file    Planned Follow Up No follow-ups on file.   Total Time in Face-to-Face and in Coordination of Care for patient including independent/personal interpretation/review of prior testing, medical history, examination, medication adjustment, communicating results with the patient directly, and documentation with the EHR is 25 minutes.   Justice Britain, MD [facility] Office # [phone]

## 2021-10-22 ENCOUNTER — Telehealth: Payer: Self-pay | Admitting: Gastroenterology

## 2021-10-22 NOTE — Telephone Encounter (Signed)
Mansouraty, Netty Starring., MD  Loretha Stapler, RN Zakar Brosch,  I released the results to the patient on MyChart.  As I reviewed the rest of her blood sugars over the course of the last few years, I do recommend that as she has had a very slight elevation in her blood glucoses, that her PCP should evaluate her for prediabetes to ensure that that is not occurring.  Otherwise the only finding was a slight iron insufficiency.  If she can tolerate initiation of ferrous gluconate 324 mg daily or ferrous sulfate 325 mg daily versus every other day that would be helpful.  When she comes back in a few months for her follow-up endoscopy we can also consider iron studies being repeated.  Thanks.  GM

## 2021-10-22 NOTE — Telephone Encounter (Signed)
Patient returned your call, please advise. 

## 2021-10-22 NOTE — Telephone Encounter (Signed)
The pt has been advised of the results.  She will try the iron and f/u as recommended.  She will call if there is an issue with iron.  She will also f/u with PCP in regards to blood sugar

## 2021-10-25 ENCOUNTER — Other Ambulatory Visit (HOSPITAL_COMMUNITY): Payer: Self-pay

## 2021-10-25 NOTE — Telephone Encounter (Signed)
Received notification from [facility] regarding a prior authorization for RABEPRAZOLE 20MG . Authorization has been APPROVED from 5.26.23 to 5.24.24.

## 2021-11-04 DIAGNOSIS — R7309 Other abnormal glucose: Secondary | ICD-10-CM | POA: Diagnosis not present

## 2021-11-04 DIAGNOSIS — R5383 Other fatigue: Secondary | ICD-10-CM | POA: Diagnosis not present

## 2022-01-14 ENCOUNTER — Other Ambulatory Visit: Payer: Self-pay | Admitting: Gastroenterology

## 2022-02-09 ENCOUNTER — Encounter: Payer: Self-pay | Admitting: Gastroenterology

## 2022-02-10 DIAGNOSIS — L821 Other seborrheic keratosis: Secondary | ICD-10-CM | POA: Diagnosis not present

## 2022-02-10 DIAGNOSIS — D485 Neoplasm of uncertain behavior of skin: Secondary | ICD-10-CM | POA: Diagnosis not present

## 2022-02-10 DIAGNOSIS — L570 Actinic keratosis: Secondary | ICD-10-CM | POA: Diagnosis not present

## 2022-02-10 DIAGNOSIS — D225 Melanocytic nevi of trunk: Secondary | ICD-10-CM | POA: Diagnosis not present

## 2022-02-10 DIAGNOSIS — L814 Other melanin hyperpigmentation: Secondary | ICD-10-CM | POA: Diagnosis not present

## 2022-02-10 DIAGNOSIS — L578 Other skin changes due to chronic exposure to nonionizing radiation: Secondary | ICD-10-CM | POA: Diagnosis not present

## 2022-02-13 ENCOUNTER — Encounter: Payer: Self-pay | Admitting: Gastroenterology

## 2022-02-18 ENCOUNTER — Encounter: Payer: Self-pay | Admitting: Gastroenterology

## 2022-02-26 ENCOUNTER — Ambulatory Visit (AMBULATORY_SURGERY_CENTER): Payer: Self-pay

## 2022-02-26 VITALS — Ht 64.0 in | Wt 130.0 lb

## 2022-02-26 DIAGNOSIS — K315 Obstruction of duodenum: Secondary | ICD-10-CM

## 2022-02-26 NOTE — Progress Notes (Signed)
No egg or soy allergy known to patient  No issues known to pt with past sedation with any surgeries or procedures Patient denies ever being told they had issues or difficulty with intubation  No FH of Malignant Hyperthermia Pt is not on diet pills Pt is not on home 02  Pt is not on blood thinners  Pt denies issues with constipation  No A fib or A flutter Have any cardiac testing pending--NO Pt instructed to use Singlecare.com or GoodRx for a price reduction on prep   

## 2022-03-06 IMAGING — RF DG C-ARM 1-60 MIN
1 series · 11 of 11 positions shown · non-contrast
Comparison: None.

CLINICAL DATA: pain

EXAM:
DG C-ARM 1-60 MIN
FLUOROSCOPY TIME:  Fluoroscopy Time:

[Series 1: run · 11 of 11 slices shown]
[im 1/11]
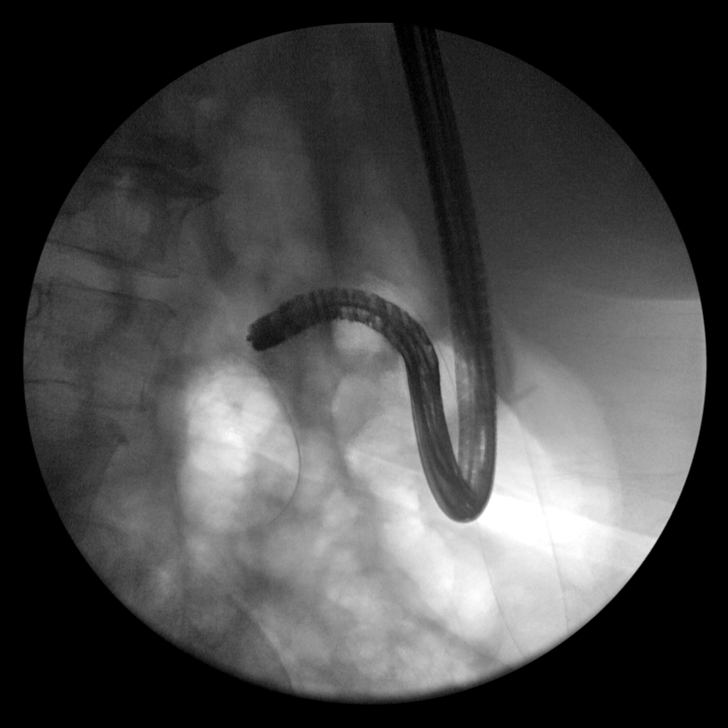
[im 2/11]
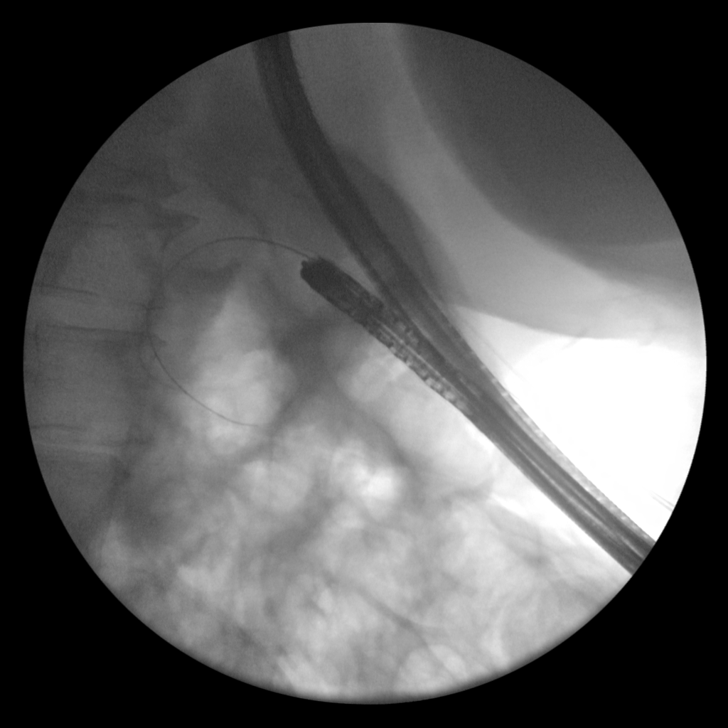
[im 3/11]
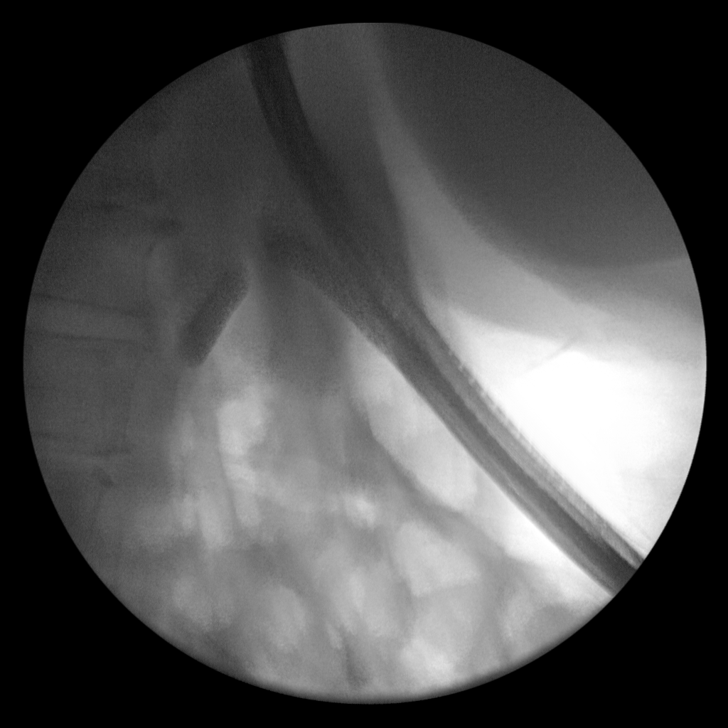
[im 4/11]
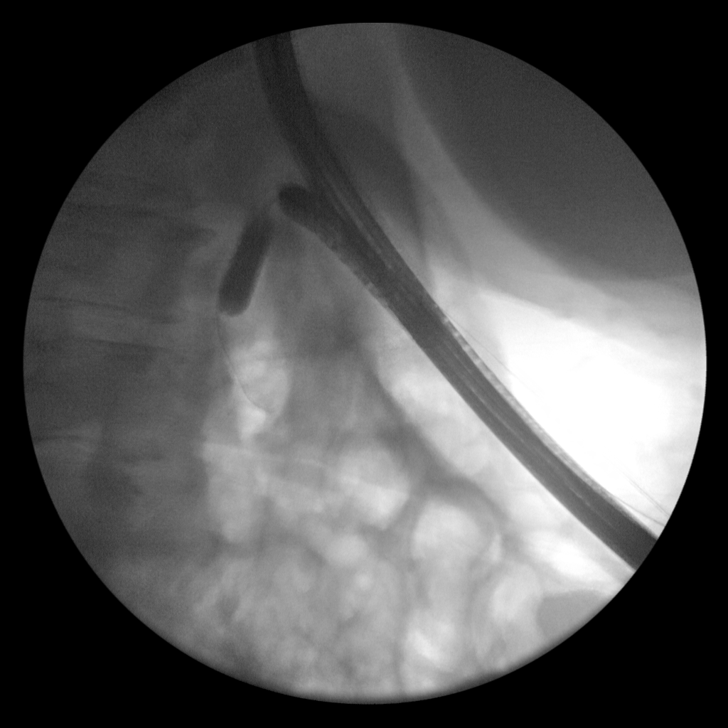
[im 5/11]
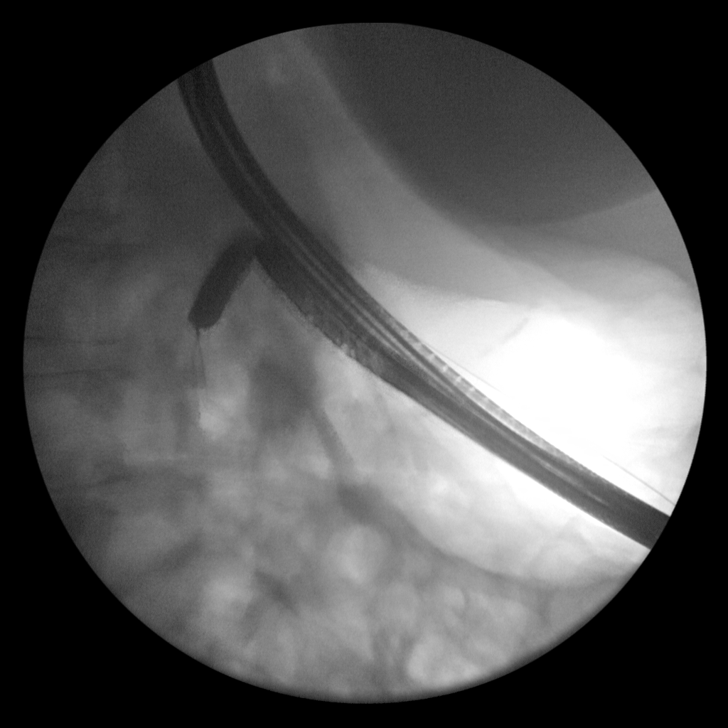
[im 6/11]
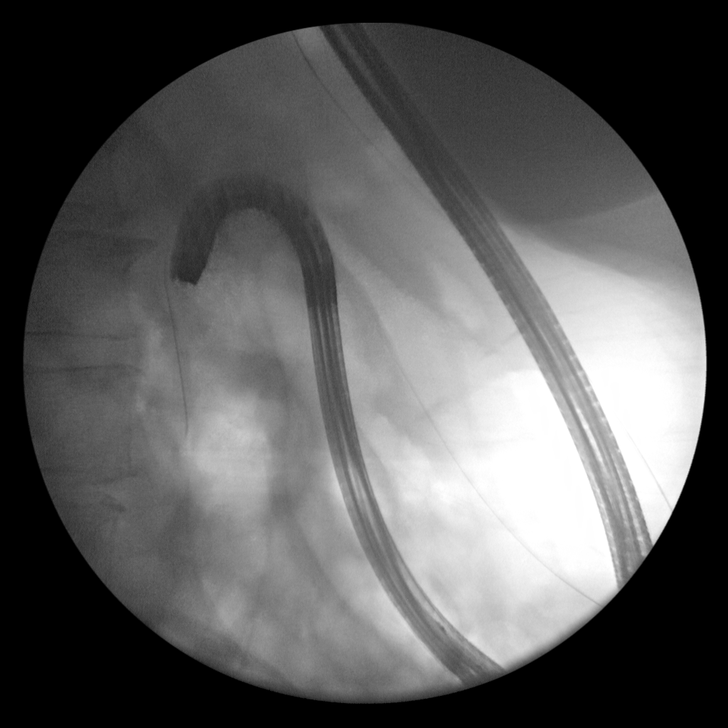
[im 7/11]
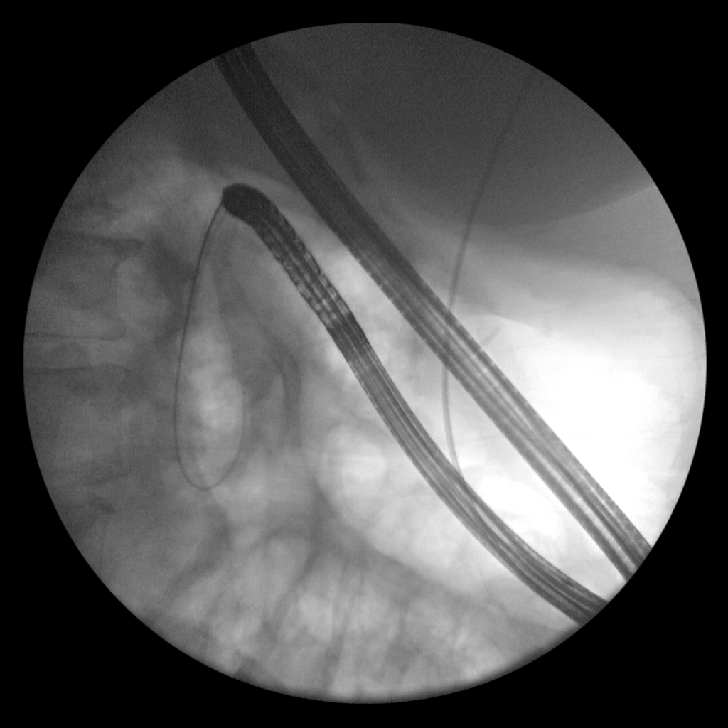
[im 8/11]
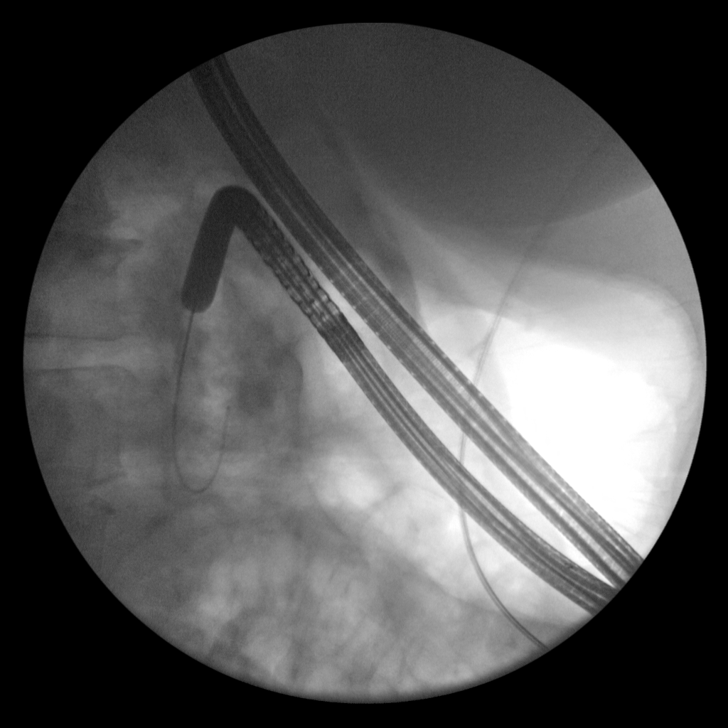
[im 9/11]
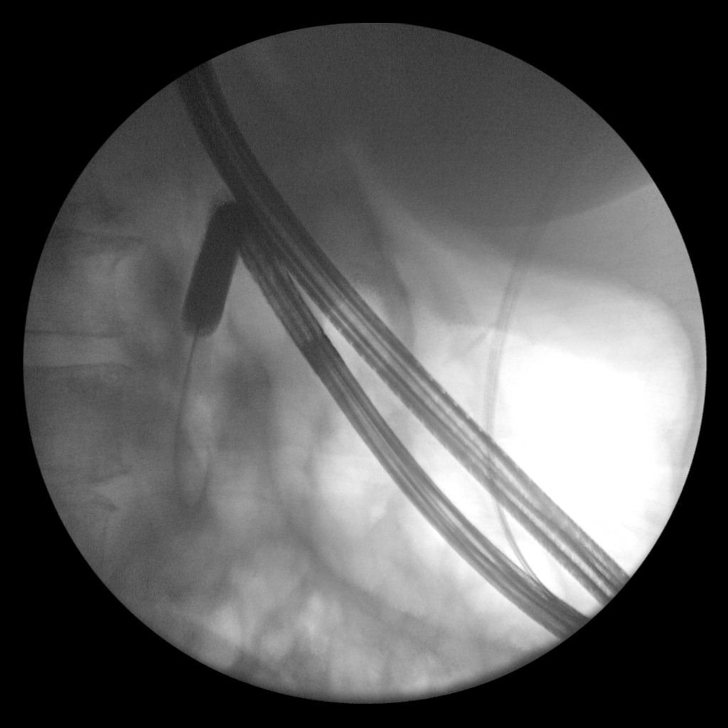
[im 10/11]
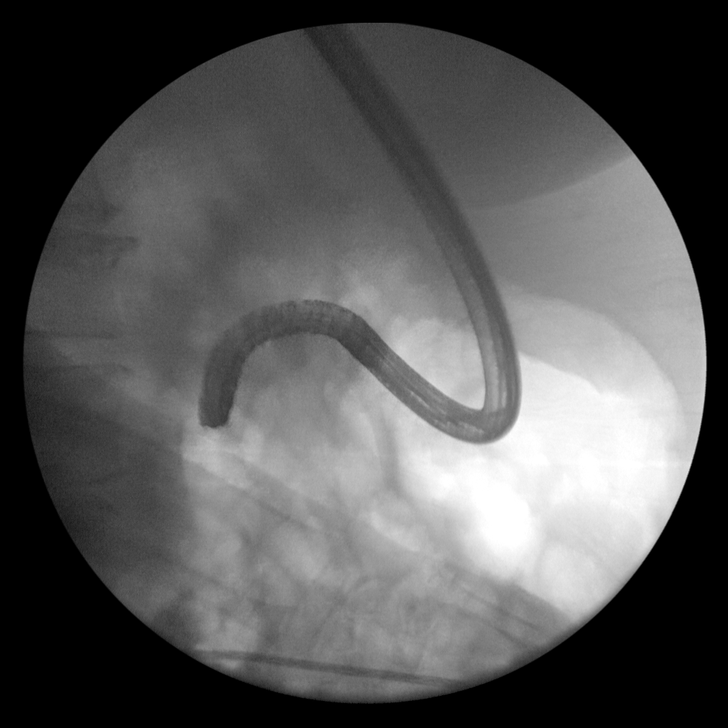
[im 11/11]
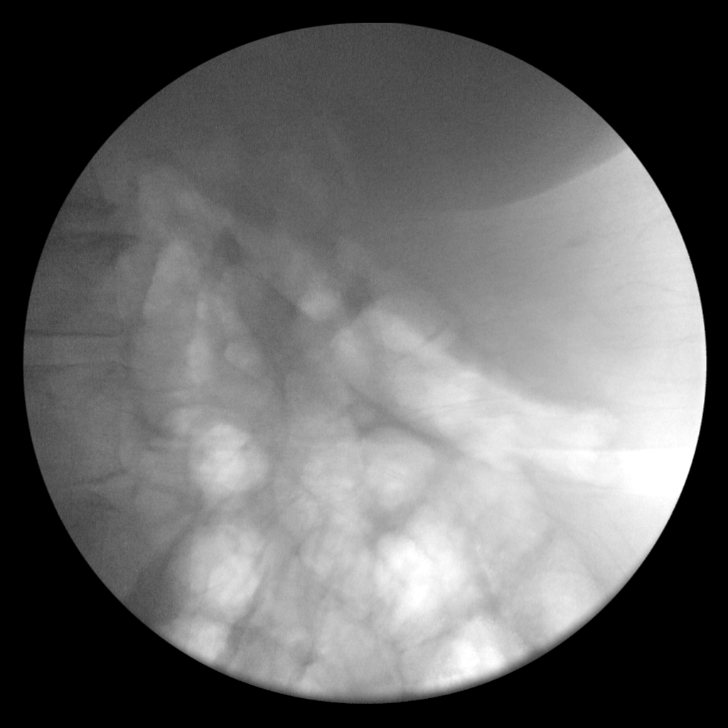

[11 of 11 positions shown; findings below may reference images not displayed]

FINDINGS: Eleven C-arm fluoroscopic images were obtained intraoperatively and
submitted for post operative interpretation. These images
demonstrate advancement an endoscope along a guidewire and inflation
of balloon during reported dilation of a duodenal stricture. Please
see the performing provider's procedural report for further detail.
IMPRESSION: Intraoperative fluoroscopy, as detailed above.

## 2022-03-17 ENCOUNTER — Encounter: Payer: Self-pay | Admitting: Gastroenterology

## 2022-03-25 ENCOUNTER — Encounter: Payer: Self-pay | Admitting: Gastroenterology

## 2022-03-25 ENCOUNTER — Ambulatory Visit (AMBULATORY_SURGERY_CENTER): Payer: BC Managed Care – PPO | Admitting: Gastroenterology

## 2022-03-25 VITALS — BP 111/74 | HR 70 | Temp 98.2°F | Resp 20 | Ht 64.0 in | Wt 130.0 lb

## 2022-03-25 DIAGNOSIS — K2990 Gastroduodenitis, unspecified, without bleeding: Secondary | ICD-10-CM

## 2022-03-25 DIAGNOSIS — K2970 Gastritis, unspecified, without bleeding: Secondary | ICD-10-CM

## 2022-03-25 DIAGNOSIS — K315 Obstruction of duodenum: Secondary | ICD-10-CM

## 2022-03-25 DIAGNOSIS — K319 Disease of stomach and duodenum, unspecified: Secondary | ICD-10-CM | POA: Diagnosis not present

## 2022-03-25 MED ORDER — SODIUM CHLORIDE 0.9 % IV SOLN: 500.0000 mL | Freq: Once | INTRAVENOUS | Status: DC

## 2022-03-25 NOTE — Progress Notes (Unsigned)
0829 Robinul 0.1 mg IV given due large amount of secretions upon assessment.  MD made aware, vss

## 2022-03-25 NOTE — Progress Notes (Unsigned)
GASTROENTEROLOGY PROCEDURE H&P NOTE   Primary Care Physician: Irena Reichmann, DO  HPI: Ane Conerly is a 61 y.o. 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  Past Medical History:  Diagnosis Date   Allergy    Arthritis    Blood transfusion without reported diagnosis 1997   Clotting disorder (HCC) 1997   pregnancy induced dvt- no problems since then   Fibromyalgia    GERD (gastroesophageal reflux disease)    Hepatic steatosis 07/16/2021   History of DVT (deep vein thrombosis)    during pregnancy   History of pulmonary embolism    occurred during pregnancy   Overactive bladder    Past Surgical History:  Procedure Laterality Date   ABDOMINAL EXPOSURE N/A 02/19/2016   Procedure: ABDOMINAL EXPOSURE;  Surgeon: Larina Earthly, MD;  Location: [facility];  Service: Vascular;  Laterality: N/A;   ABLATION  2006   endometrial   ANTERIOR LUMBAR FUSION N/A 02/19/2016   Procedure: Anterior Lumbar Interbody Fusion  - Lumbar five-sacral one;  Surgeon: Tia Alert, MD;  Location: [facility];  Service: Neurosurgery;  Laterality: N/A;   BACK SURGERY     BALLOON DILATION N/A 03/17/2021   Procedure: BALLOON DILATION;  Surgeon: Mansouraty, Netty Starring., MD;  Location: [facility];  Service: Gastroenterology;  Laterality: N/A;   BALLOON DILATION N/A 04/21/2021   Procedure: BALLOON DILATION;  Surgeon: Meridee Score Netty Starring., MD;  Location: [facility];  Service: Gastroenterology;  Laterality: N/A;   BIOPSY  03/17/2021   Procedure: BIOPSY;  Surgeon: Meridee Score Netty Starring., MD;  Location: [facility];  Service: Gastroenterology;;   breast implants     saline   CARPAL TUNNEL RELEASE Right 04/23/2015   Procedure: RIGHT CARPAL TUNNEL RELEASE;  Surgeon: Cindee Salt, MD;  Location: [facility];  Service: Orthopedics;  Laterality: Right;   CARPAL TUNNEL RELEASE Left 05/16/2015   Procedure: LEFT CARPAL TUNNEL RELEASE;  Surgeon: Cindee Salt, MD;  Location: [facility];  Service: Orthopedics;  Laterality: Left;   COLONIC STENT PLACEMENT N/A 07/16/2021   Procedure: AXIOS STENT PLACEMENT;  Surgeon: Lemar Lofty., MD;  Location: [facility];  Service: Gastroenterology;  Laterality: N/A;   ESOPHAGOGASTRODUODENOSCOPY N/A 07/17/2021   Procedure: ESOPHAGOGASTRODUODENOSCOPY (EGD);  Surgeon: Rachael Fee, MD;  Location: [facility];  Service: Gastroenterology;  Laterality: N/A;   ESOPHAGOGASTRODUODENOSCOPY (EGD) WITH PROPOFOL N/A 03/17/2021   Procedure: ESOPHAGOGASTRODUODENOSCOPY (EGD) WITH PROPOFOL;  Surgeon: Meridee Score Netty Starring., MD;  Location: [facility];  Service: Gastroenterology;  Laterality: N/A;  Fluoro   ESOPHAGOGASTRODUODENOSCOPY (EGD) WITH PROPOFOL N/A 04/21/2021   Procedure: ESOPHAGOGASTRODUODENOSCOPY (EGD) WITH PROPOFOL;  Surgeon: Meridee Score Netty Starring., MD;  Location: [facility];  Service: Gastroenterology;  Laterality: N/A;  fluoro   ESOPHAGOGASTRODUODENOSCOPY (EGD) WITH PROPOFOL N/A 05/16/2021   Procedure: ESOPHAGOGASTRODUODENOSCOPY (EGD) WITH PROPOFOL;  Surgeon: Meridee Score Netty Starring., MD;  Location: [facility];  Service: Gastroenterology;  Laterality: N/A;   ESOPHAGOGASTRODUODENOSCOPY (EGD) WITH PROPOFOL N/A 07/16/2021   Procedure: ESOPHAGOGASTRODUODENOSCOPY (EGD) WITH PROPOFOL;  Surgeon: Meridee Score Netty Starring., MD;  Location: [facility];  Service: Gastroenterology;  Laterality: N/A;   FOREIGN BODY REMOVAL  07/16/2021   Procedure: FOREIGN BODY REMOVAL;  Surgeon: Meridee Score Netty Starring., MD;  Location: [facility];  Service: Gastroenterology;;   Francine Graven REMOVAL  07/17/2021   Procedure: STENT REMOVAL;  Surgeon: Rachael Fee, MD;  Location: [facility];  Service: Gastroenterology;;   TONSILLECTOMY     TRIGGER FINGER RELEASE Right 04/23/2015   Procedure: RELEASE  TRIGGER FINGER/A-1 PULLEY RIGHT THUMB;  Surgeon: Daryll Brod, MD;  Location: [place];  Service: Orthopedics;  Laterality: Right;    WISDOM TOOTH EXTRACTION     Current Outpatient Medications  Medication Sig Dispense Refill   acetaminophen (TYLENOL) 500 MG tablet Take 1,500 mg by mouth every 6 (six) hours as needed for mild pain.     b complex vitamins capsule Take 1 capsule by mouth daily.     diclofenac Sodium (VOLTAREN) 1 % GEL Apply 1 application topically daily.     guaiFENesin (MUCINEX) 600 MG 12 hr tablet Take 600 mg by mouth daily as needed (Allergies).     metoCLOPramide (REGLAN) 10 MG tablet Take 1 tablet (10 mg total) by mouth 4 (four) times daily for 8 days. (Patient not taking: Reported on 08/27/2021) 30 tablet 0   Multiple Vitamins-Minerals (MULTIVITAMIN PO) Take 1 tablet by mouth daily.     ondansetron (ZOFRAN) 4 MG tablet Take 1 tablet (4 mg total) by mouth every 4 (four) hours as needed for nausea. (Patient not taking: Reported on 10/15/2021) 20 tablet 0   prochlorperazine (COMPAZINE) 25 MG suppository Place 1 suppository (25 mg total) rectally every 6 (six) hours as needed for nausea or vomiting. If oral anti-nausea medication is not helping (Patient not taking: Reported on 10/15/2021) 12 suppository 0   promethazine (PHENERGAN) 12.5 MG tablet Take 1 tablet (12.5 mg total) by mouth every 6 (six) hours as needed for nausea or vomiting. (Patient not taking: Reported on 10/15/2021) 30 tablet 2   RABEprazole (ACIPHEX) 20 MG tablet TAKE 1 TABLET BY MOUTH TWICE DAILY. 180 tablet 1   sucralfate (CARAFATE) 1 g tablet Take 1 tablet (1 g total) by mouth 4 (four) times daily -  with meals and at bedtime. 120 tablet 11   No current facility-administered medications for this visit.    Current Outpatient Medications:    acetaminophen (TYLENOL) 500 MG tablet, Take 1,500 mg by mouth every 6 (six) hours as needed for mild pain., Disp: , Rfl:    b complex vitamins capsule, Take 1 capsule by mouth daily., Disp: , Rfl:    diclofenac Sodium (VOLTAREN) 1 % GEL, Apply 1 application topically daily., Disp: , Rfl:    guaiFENesin  (MUCINEX) 600 MG 12 hr tablet, Take 600 mg by mouth daily as needed (Allergies)., Disp: , Rfl:    metoCLOPramide (REGLAN) 10 MG tablet, Take 1 tablet (10 mg total) by mouth 4 (four) times daily for 8 days. (Patient not taking: Reported on 08/27/2021), Disp: 30 tablet, Rfl: 0   Multiple Vitamins-Minerals (MULTIVITAMIN PO), Take 1 tablet by mouth daily., Disp: , Rfl:    ondansetron (ZOFRAN) 4 MG tablet, Take 1 tablet (4 mg total) by mouth every 4 (four) hours as needed for nausea. (Patient not taking: Reported on 10/15/2021), Disp: 20 tablet, Rfl: 0   prochlorperazine (COMPAZINE) 25 MG suppository, Place 1 suppository (25 mg total) rectally every 6 (six) hours as needed for nausea or vomiting. If oral anti-nausea medication is not helping (Patient not taking: Reported on 10/15/2021), Disp: 12 suppository, Rfl: 0   promethazine (PHENERGAN) 12.5 MG tablet, Take 1 tablet (12.5 mg total) by mouth every 6 (six) hours as needed for nausea or vomiting. (Patient not taking: Reported on 10/15/2021), Disp: 30 tablet, Rfl: 2   RABEprazole (ACIPHEX) 20 MG tablet, TAKE 1 TABLET BY MOUTH TWICE DAILY., Disp: 180 tablet, Rfl: 1   sucralfate (CARAFATE) 1 g tablet, Take 1 tablet (1 g total) by  mouth 4 (four) times daily -  with meals and at bedtime., Disp: 120 tablet, Rfl: 11 Allergies  Allergen Reactions   Latex Other (See Comments)    Skin irritation   Family History  Problem Relation Age of Onset   Colon polyps Mother    Hyperlipidemia Mother    Colon cancer Mother        in her 26s or 43s   Osteoporosis Mother    Lung cancer Mother    Diabetes Father    Hyperlipidemia Father    Hypertension Father    Breast cancer Maternal Aunt        68's   Pancreatic cancer Neg Hx    Stomach cancer Neg Hx    Liver disease Neg Hx    Inflammatory bowel disease Neg Hx    Esophageal cancer Neg Hx    Rectal cancer Neg Hx    Social History   Socioeconomic History   Marital status: Divorced    Spouse name: Not on file    Number of children: Not on file   Years of education: Not on file   Highest education level: Not on file  Occupational History   Not on file  Tobacco Use   Smoking status: Never   Smokeless tobacco: Never  Vaping Use   Vaping Use: Never used  Substance and Sexual Activity   Alcohol use: Yes    Alcohol/week: 6.0 standard drinks of alcohol    Types: 4 Glasses of wine, 2 Standard drinks or equivalent per week    Comment: occ (currently none due to stomach issues 01/29/21)   Drug use: No   Sexual activity: Not Currently    Partners: Male    Birth control/protection: Surgical    Comment: Ablation  Other Topics Concern   Not on file  Social History Narrative   Not on file   Social Determinants of Health   Financial Resource Strain: Not on file  Food Insecurity: Not on file  Transportation Needs: Not on file  Physical Activity: Not on file  Stress: Not on file  Social Connections: Not on file  Intimate Partner Violence: Not on file    Physical Exam: There were no vitals filed for this visit. There is no height or weight on file to calculate BMI. GEN: NAD EYE: Sclerae anicteric ENT: MMM CV: Non-tachycardic GI: Soft, NT/ND NEURO:  Alert & Oriented x 3  Lab Results: No results for input(s): "WBC", "HGB", "HCT", "PLT" in the last 72 hours. BMET No results for input(s): "NA", "K", "CL", "CO2", "GLUCOSE", "BUN", "CREATININE", "CALCIUM" in the last 72 hours. LFT No results for input(s): "PROT", "ALBUMIN", "AST", "ALT", "ALKPHOS", "BILITOT", "BILIDIR", "IBILI" in the last 72 hours. PT/INR No results for input(s): "LABPROT", "INR" in the last 72 hours.   Impression / Plan: This is a 61 y.o.female who presents for EGD for follow up of duodenal stenosis.  The risks and benefits of endoscopic evaluation/treatment were discussed with the patient and/or family; these include but are not limited to the risk of perforation, infection, bleeding, missed lesions, lack of diagnosis,  severe illness requiring hospitalization, as well as anesthesia and sedation related illnesses.  The patient's history has been reviewed, patient examined, no change in status, and deemed stable for procedure.  The patient and/or family is agreeable to proceed.    Corliss Parish, MD [facility] Office # [phone]

## 2022-03-25 NOTE — Progress Notes (Signed)
Called to room to assist during endoscopic procedure.  Patient ID and intended procedure confirmed with present staff. Received instructions for my participation in the procedure from the performing physician.  

## 2022-03-25 NOTE — Patient Instructions (Addendum)
Full liquid diet today, tomorrow you will resume normal diet.  Continue present medications. EGD scheduled for Dec. 5 at 10:00 am. Instructions given.    YOU HAD AN ENDOSCOPIC PROCEDURE TODAY AT [facility]:   Refer to the procedure report that was given to you for any specific questions about what was found during the examination.  If the procedure report does not answer your questions, please call your gastroenterologist to clarify.  If you requested that your care partner not be given the details of your procedure findings, then the procedure report has been included in a sealed envelope for you to review at your convenience later.  YOU SHOULD EXPECT: Some feelings of bloating in the abdomen. Passage of more gas than usual.  Walking can help get rid of the air that was put into your GI tract during the procedure and reduce the bloating. If you had a lower endoscopy (such as a colonoscopy or flexible sigmoidoscopy) you may notice spotting of blood in your stool or on the toilet paper. If you underwent a bowel prep for your procedure, you may not have a normal bowel movement for a few days.  Please Note:  You might notice some irritation and congestion in your nose or some drainage.  This is from the oxygen used during your procedure.  There is no need for concern and it should clear up in a day or so.  SYMPTOMS TO REPORT IMMEDIATELY:   Following upper endoscopy (EGD)  Vomiting of blood or coffee ground material  New chest pain or pain under the shoulder blades  Painful or persistently difficult swallowing  New shortness of breath  Fever of 100F or higher  Black, tarry-looking stools  For urgent or emergent issues, a gastroenterologist can be reached at any hour by calling [phone]. Do not use MyChart messaging for urgent concerns.    DIET:  Full liquid diet today, tomorrow you may proceed to your regular diet.  Drink plenty of fluids but you should avoid alcoholic  beverages for 24 hours.  ACTIVITY:  You should plan to take it easy for the rest of today and you should NOT DRIVE or use heavy machinery until tomorrow (because of the sedation medicines used during the test).    FOLLOW UP: Our staff will call the number listed on your records the next business day following your procedure.  We will call around 7:15- 8:00 am to check on you and address any questions or concerns that you may have regarding the information given to you following your procedure. If we do not reach you, we will leave a message.     If any biopsies were taken you will be contacted by phone or by letter within the next 1-3 weeks.  Please call us at [phone] if you have not heard about the biopsies in 3 weeks.    SIGNATURES/CONFIDENTIALITY: You and/or your care partner have signed paperwork which will be entered into your electronic medical record.  These signatures attest to the fact that that the information above on your After Visit Summary has been reviewed and is understood.  Full responsibility of the confidentiality of this discharge information lies with you and/or your care-partner.

## 2022-03-25 NOTE — Progress Notes (Unsigned)
Report given to PACU, vss 

## 2022-03-25 NOTE — Op Note (Signed)
[place] Patient Name: Patricia Hart Procedure Date: 03/25/2022 8:28 AM MRN: [id number] Endoscopist: Justice Britain , MD, [id number] Age: 61 Referring MD:  Date of Birth: 23-Jun-1960 Gender: Female Account #: [id number] Procedure:                Upper GI endoscopy Indications:              Epigastric abdominal pain, Stenosis of the                            duodenum, Follow-up of duodenal stenosis Medicines:                Monitored Anesthesia Care Procedure:                Pre-Anesthesia Assessment:                           - Prior to the procedure, a History and Physical                            was performed, and patient medications and                            allergies were reviewed. The patient's tolerance of                            previous anesthesia was also reviewed. The risks                            and benefits of the procedure and the sedation                            options and risks were discussed with the patient.                            All questions were answered, and informed consent                            was obtained. Prior Anticoagulants: The patient has                            taken no anticoagulant or antiplatelet agents. ASA                            Grade Assessment: II - A patient with mild systemic                            disease. After reviewing the risks and benefits,                            the patient was deemed in satisfactory condition to                            undergo the procedure.  After obtaining informed consent, the endoscope was                            passed under direct vision. Throughout the                            procedure, the patient's blood pressure, pulse, and                            oxygen saturations were monitored continuously. The                            Endoscope was introduced through the mouth, and                            advanced to the  second part of duodenum. The upper                            GI endoscopy was accomplished without difficulty.                            The patient tolerated the procedure. Scope In: Scope Out: Findings:                 No gross lesions were noted in the entire esophagus.                           The Z-line was regular and was found 35 cm from the                            incisors.                           A 1 cm hiatal hernia was present.                           Patchy mildly erythematous mucosa without bleeding                            was found in the entire examined stomach. Biopsies                            were taken with a cold forceps for histology and                            Helicobacter pylori testing.                           An acquired benign-appearing, intrinsic severe                            stenosis was found in the duodenal sweep and was                            only traversed  after dilation as noted. A TTS                            dilator was passed through the scope. Dilation with                            an 04-05-12 mm pyloric balloon dilator was                            performed ( up to a total of 13 mm).                           No other gross lesions were noted in the duodenal                            bulb and in the second portion of the duodenum. The                            previous duodenal ulceration has been well-healed. Complications:            No immediate complications. Estimated Blood Loss:     Estimated blood loss was minimal. Impression:               - No gross lesions in the entire esophagus. Z-line                            regular, 35 cm from the incisors.                           - 1 cm hiatal hernia.                           - Erythematous mucosa in the stomach. Biopsied.                           - Acquired duodenal stenosis at the sweep -                            traversed after dilation.                            - No other gross lesions in the duodenal bulb and                            in the second portion of the duodenum. The duodenum                            ulceration previously noted has healed nicely. Recommendation:           - The patient will be observed post-procedure,                            until all discharge criteria are met.                           -  Discharge patient to home.                           - Patient has a contact number available for                            emergencies. The signs and symptoms of potential                            delayed complications were discussed with the                            patient. Return to normal activities tomorrow.                            Written discharge instructions were provided to the                            patient.                           - Full liquid diet today.                           - Continue present medications. PPI twice daily and                            Carafate to 4 times daily.                           - Await pathology results.                           - Consider repeat EGD with dilation in 4 weeks to                            further dilate duodenal stricture.                           - The findings and recommendations were discussed                            with the patient. Justice Britain, MD 03/25/2022 8:51:48 AM

## 2022-03-25 NOTE — Progress Notes (Unsigned)
Pt's states no medical or surgical changes since previsit or office visit. 

## 2022-03-26 ENCOUNTER — Telehealth: Payer: Self-pay

## 2022-03-26 NOTE — Telephone Encounter (Signed)
  Follow up Call-     03/25/2022    7:59 AM 01/30/2021    2:27 PM  Call back number  Post procedure Call Back phone  # [phone]  Permission to leave phone message Yes Yes     Patient questions:  Do you have a fever, pain , or abdominal swelling? No. Pain Score  0 *  Have you tolerated food without any problems? Yes.    Have you been able to return to your normal activities? Yes.    Do you have any questions about your discharge instructions: Diet   No. Medications  No. Follow up visit  No.  Do you have questions or concerns about your Care? No.  Actions: * If pain score is 4 or above: No action needed, pain <4.

## 2022-03-31 ENCOUNTER — Encounter: Payer: Self-pay | Admitting: Gastroenterology

## 2022-04-07 ENCOUNTER — Encounter: Payer: Self-pay | Admitting: Gastroenterology

## 2022-04-07 ENCOUNTER — Other Ambulatory Visit: Payer: Self-pay | Admitting: Gastroenterology

## 2022-04-07 DIAGNOSIS — R110 Nausea: Secondary | ICD-10-CM

## 2022-04-07 MED ORDER — PROMETHAZINE HCL 12.5 MG PO TABS: 12.5000 mg | ORAL_TABLET | Freq: Four times a day (QID) | ORAL | 2 refills | Status: DC | PRN

## 2022-04-07 NOTE — Progress Notes (Signed)
Patient seen with mother today. Patient will need Promethazine refill and we will place refills.  Corliss Parish, MD [facility] Office # [phone]

## 2022-04-09 ENCOUNTER — Telehealth: Payer: Self-pay

## 2022-04-09 NOTE — Patient Outreach (Signed)
  Care Coordination   04/09/2022 Name: Patricia Hart MRN: [id number] DOB: April 15, 1961   Care Coordination Outreach Attempts:  An unsuccessful telephone outreach was attempted today to offer the patient information about available care coordination services as a benefit of their health plan.   Follow Up Plan:  Additional outreach attempts will be made to offer the patient care coordination information and services.   Encounter Outcome:  No Answer  Care Coordination Interventions Activated:  No   Care Coordination Interventions:  No, not indicated    Bary Leriche, RN, MSN [facility] Care Management Care Management Coordinator Direct Line [phone]

## 2022-04-12 ENCOUNTER — Encounter: Payer: Self-pay | Admitting: Gastroenterology

## 2022-04-13 ENCOUNTER — Other Ambulatory Visit: Payer: Self-pay

## 2022-04-13 MED ORDER — RABEPRAZOLE SODIUM 20 MG PO TBEC: 20.0000 mg | DELAYED_RELEASE_TABLET | Freq: Two times a day (BID) | ORAL | 1 refills | Status: DC

## 2022-04-14 MED ORDER — PROMETHAZINE HCL 12.5 MG PO TABS: 12.5000 mg | ORAL_TABLET | Freq: Four times a day (QID) | ORAL | 2 refills | Status: DC | PRN

## 2022-04-23 ENCOUNTER — Encounter: Payer: Self-pay | Admitting: Gastroenterology

## 2022-04-28 ENCOUNTER — Ambulatory Visit (AMBULATORY_SURGERY_CENTER): Payer: BC Managed Care – PPO | Admitting: Gastroenterology

## 2022-04-28 ENCOUNTER — Encounter: Payer: Self-pay | Admitting: Gastroenterology

## 2022-04-28 VITALS — BP 105/69 | HR 75 | Temp 97.3°F | Resp 18 | Ht 64.0 in | Wt 130.0 lb

## 2022-04-28 DIAGNOSIS — K2970 Gastritis, unspecified, without bleeding: Secondary | ICD-10-CM | POA: Diagnosis not present

## 2022-04-28 DIAGNOSIS — K449 Diaphragmatic hernia without obstruction or gangrene: Secondary | ICD-10-CM | POA: Diagnosis not present

## 2022-04-28 DIAGNOSIS — K315 Obstruction of duodenum: Secondary | ICD-10-CM | POA: Diagnosis not present

## 2022-04-28 MED ORDER — SODIUM CHLORIDE 0.9 % IV SOLN: 500.0000 mL | Freq: Once | INTRAVENOUS | Status: DC

## 2022-04-28 NOTE — Progress Notes (Signed)
VS completed by DT.  Pt's states no medical or surgical changes since previsit or office visit.  

## 2022-04-28 NOTE — Progress Notes (Unsigned)
Report to PACU, RN, vss, BBS= Clear.  

## 2022-04-28 NOTE — Progress Notes (Unsigned)
GASTROENTEROLOGY PROCEDURE H&P NOTE   Primary Care Physician: Irena Reichmann, DO  HPI: Patricia Hart is a 61 y.o. female who presents for EGD for further duodenal stricture dilation.  Past Medical History:  Diagnosis Date   Allergy    Arthritis    Blood transfusion without reported diagnosis 1997   Clotting disorder (HCC) 1997   pregnancy induced dvt- no problems since then   Fibromyalgia    GERD (gastroesophageal reflux disease)    Hepatic steatosis 07/16/2021   History of DVT (deep vein thrombosis)    during pregnancy   History of pulmonary embolism    occurred during pregnancy   Overactive bladder    Past Surgical History:  Procedure Laterality Date   ABDOMINAL EXPOSURE N/A 02/19/2016   Procedure: ABDOMINAL EXPOSURE;  Surgeon: Larina Earthly, MD;  Location: [facility];  Service: Vascular;  Laterality: N/A;   ABLATION  2006   endometrial   ANTERIOR LUMBAR FUSION N/A 02/19/2016   Procedure: Anterior Lumbar Interbody Fusion  - Lumbar five-sacral one;  Surgeon: Tia Alert, MD;  Location: [facility];  Service: Neurosurgery;  Laterality: N/A;   BACK SURGERY     BALLOON DILATION N/A 03/17/2021   Procedure: BALLOON DILATION;  Surgeon: Mansouraty, Netty Starring., MD;  Location: [facility];  Service: Gastroenterology;  Laterality: N/A;   BALLOON DILATION N/A 04/21/2021   Procedure: BALLOON DILATION;  Surgeon: Meridee Score Netty Starring., MD;  Location: [facility];  Service: Gastroenterology;  Laterality: N/A;   BIOPSY  03/17/2021   Procedure: BIOPSY;  Surgeon: Meridee Score Netty Starring., MD;  Location: [facility];  Service: Gastroenterology;;   breast implants     saline   CARPAL TUNNEL RELEASE Right 04/23/2015   Procedure: RIGHT CARPAL TUNNEL RELEASE;  Surgeon: Cindee Salt, MD;  Location: [facility];  Service: Orthopedics;  Laterality: Right;   CARPAL TUNNEL RELEASE Left 05/16/2015   Procedure: LEFT CARPAL TUNNEL RELEASE;  Surgeon: Cindee Salt, MD;  Location:  [facility];  Service: Orthopedics;  Laterality: Left;   COLONIC STENT PLACEMENT N/A 07/16/2021   Procedure: AXIOS STENT PLACEMENT;  Surgeon: Lemar Lofty., MD;  Location: [facility];  Service: Gastroenterology;  Laterality: N/A;   ESOPHAGOGASTRODUODENOSCOPY N/A 07/17/2021   Procedure: ESOPHAGOGASTRODUODENOSCOPY (EGD);  Surgeon: Rachael Fee, MD;  Location: [facility];  Service: Gastroenterology;  Laterality: N/A;   ESOPHAGOGASTRODUODENOSCOPY (EGD) WITH PROPOFOL N/A 03/17/2021   Procedure: ESOPHAGOGASTRODUODENOSCOPY (EGD) WITH PROPOFOL;  Surgeon: Meridee Score Netty Starring., MD;  Location: [facility];  Service: Gastroenterology;  Laterality: N/A;  Fluoro   ESOPHAGOGASTRODUODENOSCOPY (EGD) WITH PROPOFOL N/A 04/21/2021   Procedure: ESOPHAGOGASTRODUODENOSCOPY (EGD) WITH PROPOFOL;  Surgeon: Meridee Score Netty Starring., MD;  Location: [facility];  Service: Gastroenterology;  Laterality: N/A;  fluoro   ESOPHAGOGASTRODUODENOSCOPY (EGD) WITH PROPOFOL N/A 05/16/2021   Procedure: ESOPHAGOGASTRODUODENOSCOPY (EGD) WITH PROPOFOL;  Surgeon: Meridee Score Netty Starring., MD;  Location: [facility];  Service: Gastroenterology;  Laterality: N/A;   ESOPHAGOGASTRODUODENOSCOPY (EGD) WITH PROPOFOL N/A 07/16/2021   Procedure: ESOPHAGOGASTRODUODENOSCOPY (EGD) WITH PROPOFOL;  Surgeon: Meridee Score Netty Starring., MD;  Location: [facility];  Service: Gastroenterology;  Laterality: N/A;   FOREIGN BODY REMOVAL  07/16/2021   Procedure: FOREIGN BODY REMOVAL;  Surgeon: Meridee Score Netty Starring., MD;  Location: [facility];  Service: Gastroenterology;;   Francine Graven REMOVAL  07/17/2021   Procedure: STENT REMOVAL;  Surgeon: Rachael Fee, MD;  Location: [facility];  Service: Gastroenterology;;   TONSILLECTOMY     TRIGGER FINGER RELEASE Right 04/23/2015   Procedure: RELEASE TRIGGER  FINGER/A-1 PULLEY RIGHT THUMB;  Surgeon: Cindee Salt, MD;  Location: [facility];  Service: Orthopedics;  Laterality:  Right;   WISDOM TOOTH EXTRACTION     Current Outpatient Medications  Medication Sig Dispense Refill   acetaminophen (TYLENOL) 500 MG tablet Take 1,500 mg by mouth every 6 (six) hours as needed for mild pain.     b complex vitamins capsule Take 1 capsule by mouth daily.     diclofenac Sodium (VOLTAREN) 1 % GEL Apply 1 application topically daily.     guaiFENesin (MUCINEX) 600 MG 12 hr tablet Take 600 mg by mouth daily as needed (Allergies).     metoCLOPramide (REGLAN) 10 MG tablet Take 1 tablet (10 mg total) by mouth 4 (four) times daily for 8 days. (Patient not taking: Reported on 08/27/2021) 30 tablet 0   Multiple Vitamins-Minerals (MULTIVITAMIN PO) Take 1 tablet by mouth daily.     ondansetron (ZOFRAN) 4 MG tablet Take 1 tablet (4 mg total) by mouth every 4 (four) hours as needed for nausea. (Patient not taking: Reported on 10/15/2021) 20 tablet 0   prochlorperazine (COMPAZINE) 25 MG suppository Place 1 suppository (25 mg total) rectally every 6 (six) hours as needed for nausea or vomiting. If oral anti-nausea medication is not helping (Patient not taking: Reported on 10/15/2021) 12 suppository 0   promethazine (PHENERGAN) 12.5 MG tablet Take 1 tablet (12.5 mg total) by mouth every 6 (six) hours as needed for nausea or vomiting. 30 tablet 2   RABEprazole (ACIPHEX) 20 MG tablet Take 1 tablet (20 mg total) by mouth 2 (two) times daily. 180 tablet 1   sucralfate (CARAFATE) 1 g tablet Take 1 tablet (1 g total) by mouth 4 (four) times daily -  with meals and at bedtime. 120 tablet 11   No current facility-administered medications for this visit.    Current Outpatient Medications:    acetaminophen (TYLENOL) 500 MG tablet, Take 1,500 mg by mouth every 6 (six) hours as needed for mild pain., Disp: , Rfl:    b complex vitamins capsule, Take 1 capsule by mouth daily., Disp: , Rfl:    diclofenac Sodium (VOLTAREN) 1 % GEL, Apply 1 application topically daily., Disp: , Rfl:    guaiFENesin (MUCINEX) 600 MG  12 hr tablet, Take 600 mg by mouth daily as needed (Allergies)., Disp: , Rfl:    metoCLOPramide (REGLAN) 10 MG tablet, Take 1 tablet (10 mg total) by mouth 4 (four) times daily for 8 days. (Patient not taking: Reported on 08/27/2021), Disp: 30 tablet, Rfl: 0   Multiple Vitamins-Minerals (MULTIVITAMIN PO), Take 1 tablet by mouth daily., Disp: , Rfl:    ondansetron (ZOFRAN) 4 MG tablet, Take 1 tablet (4 mg total) by mouth every 4 (four) hours as needed for nausea. (Patient not taking: Reported on 10/15/2021), Disp: 20 tablet, Rfl: 0   prochlorperazine (COMPAZINE) 25 MG suppository, Place 1 suppository (25 mg total) rectally every 6 (six) hours as needed for nausea or vomiting. If oral anti-nausea medication is not helping (Patient not taking: Reported on 10/15/2021), Disp: 12 suppository, Rfl: 0   promethazine (PHENERGAN) 12.5 MG tablet, Take 1 tablet (12.5 mg total) by mouth every 6 (six) hours as needed for nausea or vomiting., Disp: 30 tablet, Rfl: 2   RABEprazole (ACIPHEX) 20 MG tablet, Take 1 tablet (20 mg total) by mouth 2 (two) times daily., Disp: 180 tablet, Rfl: 1   sucralfate (CARAFATE) 1 g tablet, Take 1 tablet (1 g total) by mouth 4 (four)  times daily -  with meals and at bedtime., Disp: 120 tablet, Rfl: 11 Allergies  Allergen Reactions   Latex Other (See Comments)    Skin irritation   Family History  Problem Relation Age of Onset   Colon polyps Mother    Hyperlipidemia Mother    Colon cancer Mother        in her 84s or 75s   Osteoporosis Mother    Lung cancer Mother    Diabetes Father    Hyperlipidemia Father    Hypertension Father    Breast cancer Maternal Aunt        23's   Pancreatic cancer Neg Hx    Stomach cancer Neg Hx    Liver disease Neg Hx    Inflammatory bowel disease Neg Hx    Esophageal cancer Neg Hx    Rectal cancer Neg Hx    Social History   Socioeconomic History   Marital status: Divorced    Spouse name: Not on file   Number of children: Not on file    Years of education: Not on file   Highest education level: Not on file  Occupational History   Not on file  Tobacco Use   Smoking status: Never   Smokeless tobacco: Never  Vaping Use   Vaping Use: Never used  Substance and Sexual Activity   Alcohol use: Yes    Alcohol/week: 6.0 standard drinks of alcohol    Types: 4 Glasses of wine, 2 Standard drinks or equivalent per week    Comment: occ (currently none due to stomach issues 01/29/21)   Drug use: No   Sexual activity: Not Currently    Partners: Male    Birth control/protection: Surgical    Comment: Ablation  Other Topics Concern   Not on file  Social History Narrative   Not on file   Social Determinants of Health   Financial Resource Strain: Not on file  Food Insecurity: Not on file  Transportation Needs: Not on file  Physical Activity: Not on file  Stress: Not on file  Social Connections: Not on file  Intimate Partner Violence: Not on file    Physical Exam: There were no vitals filed for this visit. There is no height or weight on file to calculate BMI. GEN: NAD EYE: Sclerae anicteric ENT: MMM CV: Non-tachycardic GI: Soft, NT/ND NEURO:  Alert & Oriented x 3  Lab Results: No results for input(s): "WBC", "HGB", "HCT", "PLT" in the last 72 hours. BMET No results for input(s): "NA", "K", "CL", "CO2", "GLUCOSE", "BUN", "CREATININE", "CALCIUM" in the last 72 hours. LFT No results for input(s): "PROT", "ALBUMIN", "AST", "ALT", "ALKPHOS", "BILITOT", "BILIDIR", "IBILI" in the last 72 hours. PT/INR No results for input(s): "LABPROT", "INR" in the last 72 hours.   Impression / Plan: This is a 61 y.o.female who presents for EGD for further duodenal stricture dilation.  The risks and benefits of endoscopic evaluation/treatment were discussed with the patient and/or family; these include but are not limited to the risk of perforation, infection, bleeding, missed lesions, lack of diagnosis, severe illness requiring  hospitalization, as well as anesthesia and sedation related illnesses.  The patient's history has been reviewed, patient examined, no change in status, and deemed stable for procedure.  The patient and/or family is agreeable to proceed.    Corliss Parish, MD [facility]oscopy Office # [phone]

## 2022-04-28 NOTE — Op Note (Signed)
[facility] Patient Name: Patricia Hart Procedure Date: 04/28/2022 9:36 AM MRN: [id number] Endoscopist: Justice Britain , MD, [id number] Age: 61 Referring MD:  Date of Birth: Jul 23, 1960 Gender: Female Account #: [id number] Procedure:                Upper GI endoscopy Indications:              Stenosis of the duodenum, Follow-up of duodenal                            stenosis, For therapy of duodenal stenosis Medicines:                Monitored Anesthesia Care Procedure:                Pre-Anesthesia Assessment:                           - Prior to the procedure, a History and Physical                            was performed, and patient medications and                            allergies were reviewed. The patient's tolerance of                            previous anesthesia was also reviewed. The risks                            and benefits of the procedure and the sedation                            options and risks were discussed with the patient.                            All questions were answered, and informed consent                            was obtained. Prior Anticoagulants: The patient has                            taken no anticoagulant or antiplatelet agents. ASA                            Grade Assessment: II - A patient with mild systemic                            disease. After reviewing the risks and benefits,                            the patient was deemed in satisfactory condition to                            undergo the procedure.  After obtaining informed consent, the endoscope was                            passed under direct vision. Throughout the                            procedure, the patient's blood pressure, pulse, and                            oxygen saturations were monitored continuously. The                            Endoscope was introduced through the mouth, and                            advanced  to the second part of duodenum. The upper                            GI endoscopy was accomplished without difficulty.                            The patient tolerated the procedure. Scope In: Scope Out: Findings:                 No gross lesions were noted in the entire esophagus.                           The Z-line was regular and was found 34 cm from the                            incisors.                           A 2 cm hiatal hernia was present.                           No gross lesions were noted in the entire examined                            stomach.                           No gross lesions were noted in the duodenal bulb.                           An acquired benign-appearing, intrinsic moderate                            stenosis was found in the duodenal sweep and was                            only traversed after dilation. A TTS dilator was                            passed through  the scope. Dilation with an 04-05-12                            mm and a 13.5-14.5-15.5 mm anastomotic balloon                            dilator was performed. We dilated up to 14.5 mm                            today. Appropriate mucosal wrenting was noted.                           No gross lesions were noted in the second portion                            of the duodenum. Complications:            No immediate complications. Estimated Blood Loss:     Estimated blood loss was minimal. Impression:               - No gross lesions in the entire esophagus. Z-line                            regular, 34 cm from the incisors.                           - 2 cm hiatal hernia.                           - No gross lesions in the entire stomach.                           - No gross lesions in the duodenal bulb.                           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                            Dilated up to 14.5 mm with appropriate mucosal                             wrenting.                           - No gross lesions in the second portion of the                            duodenum. Recommendation:           - The patient will be observed post-procedure,                            until all discharge criteria are met.                           - Discharge patient to  home.                           - Patient has a contact number available for                            emergencies. The signs and symptoms of potential                            delayed complications were discussed with the                            patient. Return to normal activities tomorrow.                            Written discharge instructions were provided to the                            patient.                           - Full liquid diet today. And if doing well can                            advance to soft diet for next 24 to 48 hours. Then                            can go back to her previous diet.                           - Observe patient's clinical course.                           - Continue Aciphex twice daily.                           - Continues Carafate before every meal plus nightly                            x1 month. Then may decrease to twice daily.                           - If patient has recurrent symptoms, I am not sure                            how much further I can do from a dilation                            perspective with not increasing her risk for                            perforation. Consider referral for advanced  stricturotomy at one of the quatern[facility] centers if                            necessary as she did not tolerate AXIOS stenting                            previously.                           - The findings and recommendations were discussed                            with the patient. Justice Britain, MD 04/28/2022 10:08:40 AM

## 2022-04-28 NOTE — Progress Notes (Unsigned)
Called to room to assist during endoscopic procedure.  Patient ID and intended procedure confirmed with present staff. Received instructions for my participation in the procedure from the performing physician.  

## 2022-04-28 NOTE — Patient Instructions (Addendum)
Handouts Provided:  Full Liquid Diet  - The patient will be observed post-procedure, until all discharge criteria are met. - Discharge patient to home. - Patient has a contact number available for emergencies. The signs and symptoms of potential delayed complications were discussed with the patient. Return to normal activities tomorrow. Written discharge instructions were provided to the patient. - Full liquid diet today. And if doing well can advance to soft diet for next 24 to 48 hours. Then can go back to her previous diet. - Observe patient's clinical course. - Continue Aciphex twice daily. - Continues Carafate before every meal plus nightly x1 month. Then may decrease to twice daily. - If patient has recurrent symptoms, I am not sure how much further I can do from a dilation perspective with not increasing her risk for perforation. Consider referral for advanced stricturotomy at one of the quaternary centers if necessary as she did not tolerate AXIOS stenting previously. - The findings and recommendations were discussed with the patient.  YOU HAD AN ENDOSCOPIC PROCEDURE TODAY AT [facility]:   Refer to the procedure report that was given to you for any specific questions about what was found during the examination.  If the procedure report does not answer your questions, please call your gastroenterologist to clarify.  If you requested that your care partner not be given the details of your procedure findings, then the procedure report has been included in a sealed envelope for you to review at your convenience later.  YOU SHOULD EXPECT: Some feelings of bloating in the abdomen. Passage of more gas than usual.  Walking can help get rid of the air that was put into your GI tract during the procedure and reduce the bloating. If you had a lower endoscopy (such as a colonoscopy or flexible sigmoidoscopy) you may notice spotting of blood in your stool or on the toilet paper. If  you underwent a bowel prep for your procedure, you may not have a normal bowel movement for a few days.  Please Note:  You might notice some irritation and congestion in your nose or some drainage.  This is from the oxygen used during your procedure.  There is no need for concern and it should clear up in a day or so.  SYMPTOMS TO REPORT IMMEDIATELY:  Following upper endoscopy (EGD)  Vomiting of blood or coffee ground material  New chest pain or pain under the shoulder blades  Painful or persistently difficult swallowing  New shortness of breath  Fever of 100F or higher  Black, tarry-looking stools  For urgent or emergent issues, a gastroenterologist can be reached at any hour by calling [phone]. Do not use MyChart messaging for urgent concerns.    DIET:  We do recommend a small meal at first, but then you may proceed to your regular diet.  Drink plenty of fluids but you should avoid alcoholic beverages for 24 hours.  ACTIVITY:  You should plan to take it easy for the rest of today and you should NOT DRIVE or use heavy machinery until tomorrow (because of the sedation medicines used during the test).    FOLLOW UP: Our staff will call the number listed on your records the next business day following your procedure.  We will call around 7:15- 8:00 am to check on you and address any questions or concerns that you may have regarding the information given to you following your procedure. If we do not reach you, we will leave  a message.     If any biopsies were taken you will be contacted by phone or by letter within the next 1-3 weeks.  Please call us at [phone] if you have not heard about the biopsies in 3 weeks.    SIGNATURES/CONFIDENTIALITY: You and/or your care partner have signed paperwork which will be entered into your electronic medical record.  These signatures attest to the fact that that the information above on your After Visit Summary has been reviewed and is  understood.  Full responsibility of the confidentiality of this discharge information lies with you and/or your care-partner.

## 2022-04-29 ENCOUNTER — Telehealth: Payer: Self-pay | Admitting: *Deleted

## 2022-04-29 NOTE — Telephone Encounter (Signed)
Attempted to call patient for their post-procedure follow-up call. No answer. Left voicemail.   

## 2022-05-05 ENCOUNTER — Telehealth: Payer: Self-pay

## 2022-05-05 NOTE — Patient Outreach (Signed)
  Care Coordination   05/05/2022 Name: Estle Sabella MRN: [id number] DOB: 1961/03/02   Care Coordination Outreach Attempts:  A second unsuccessful outreach was attempted today to offer the patient with information about available care coordination services as a benefit of their health plan.     Follow Up Plan:  Additional outreach attempts will be made to offer the patient care coordination information and services.   Encounter Outcome:  No Answer   Care Coordination Interventions:  No, not indicated    Bary Leriche, RN, MSN [facility] Care Management Care Management Coordinator Direct Line [phone]

## 2022-05-20 ENCOUNTER — Telehealth: Payer: Self-pay

## 2022-05-20 NOTE — Patient Outreach (Signed)
  Care Coordination   05/20/2022 Name: Patricia Hart MRN: [id number] DOB: May 27, 1960   Care Coordination Outreach Attempts:  A third unsuccessful outreach was attempted today to offer the patient with information about available care coordination services as a benefit of their health plan.   Follow Up Plan:  No further outreach attempts will be made at this time. We have been unable to contact the patient to offer or enroll patient in care coordination services  Encounter Outcome:  No Answer   Care Coordination Interventions:  No, not indicated    Bary Leriche, RN, MSN [facility] Care Management Care Management Coordinator Direct Line [phone]

## 2022-10-04 ENCOUNTER — Other Ambulatory Visit: Payer: Self-pay | Admitting: Gastroenterology

## 2023-03-31 ENCOUNTER — Other Ambulatory Visit: Payer: Self-pay | Admitting: Gastroenterology

## 2023-06-29 ENCOUNTER — Other Ambulatory Visit: Payer: Self-pay | Admitting: Gastroenterology

## 2023-07-01 ENCOUNTER — Other Ambulatory Visit: Payer: Self-pay | Admitting: Gastroenterology

## 2023-09-21 ENCOUNTER — Ambulatory Visit: Payer: BC Managed Care – PPO | Admitting: Gastroenterology

## 2023-12-08 ENCOUNTER — Encounter: Payer: Self-pay | Admitting: Gastroenterology

## 2023-12-08 ENCOUNTER — Ambulatory Visit (INDEPENDENT_AMBULATORY_CARE_PROVIDER_SITE_OTHER): Payer: Self-pay | Admitting: Gastroenterology

## 2023-12-08 VITALS — BP 120/76 | HR 84 | Ht 61.5 in | Wt 145.2 lb

## 2023-12-08 DIAGNOSIS — K315 Obstruction of duodenum: Secondary | ICD-10-CM

## 2023-12-08 DIAGNOSIS — R1114 Bilious vomiting: Secondary | ICD-10-CM

## 2023-12-08 DIAGNOSIS — R110 Nausea: Secondary | ICD-10-CM | POA: Diagnosis not present

## 2023-12-08 DIAGNOSIS — Z1211 Encounter for screening for malignant neoplasm of colon: Secondary | ICD-10-CM

## 2023-12-08 DIAGNOSIS — R634 Abnormal weight loss: Secondary | ICD-10-CM

## 2023-12-08 NOTE — Patient Instructions (Signed)
 You have been scheduled for an Upper GI Series at [facility]. Your appointment is on 12/23/23 at 11:00am . Please arrive 30 minutes prior to your test for registration. Make sure not to eat or drink anything after midnight on the night before your test. If you need to reschedule, please call radiology at [phone]. ________________________________________________________________ An upper GI series uses x rays to help diagnose problems of the upper GI tract, which includes the esophagus, stomach, and duodenum. The duodenum is the first part of the small intestine. An upper GI series is conducted by a radiology technologist or a radiologist--a doctor who specializes in x-ray imaging--at a hospital or outpatient center. While sitting or standing in front of an x-ray machine, the patient drinks barium liquid, which is often white and has a chalky consistency and taste. The barium liquid coats the lining of the upper GI tract and makes signs of disease show up more clearly on x rays. X-ray video, called fluoroscopy, is used to view the barium liquid moving through the esophagus, stomach, and duodenum. Additional x rays and fluoroscopy are performed while the patient lies on an x-ray table. To fully coat the upper GI tract with barium liquid, the technologist or radiologist may press on the abdomen or ask the patient to change position. Patients hold still in various positions, allowing the technologist or radiologist to take x rays of the upper GI tract at different angles. If a technologist conducts the upper GI series, a radiologist will later examine the images to look for problems.  This test typically takes about 1 hour to complete.   If your blood pressure at your visit was 140/90 or greater, please contact your primary care physician to follow up on this.  _______________________________________________________  If you are age 70 or older, your body mass index should be between 23-30. Your Body  mass index is 27 kg/m. If this is out of the aforementioned range listed, please consider follow up with your Primary Care Provider.  If you are age 75 or younger, your body mass index should be between 19-25. Your Body mass index is 27 kg/m. If this is out of the aformentioned range listed, please consider follow up with your Primary Care Provider.   ________________________________________________________  The [facility] providers would like to encourage you to use MYCHART to communicate with providers for non-urgent requests or questions.  Due to long hold times on the telephone, sending your provider a message by [facility] may be a faster and more efficient way to get a response.  Please allow 48 business hours for a response.  Please remember that this is for non-urgent requests.  _______________________________________________________   Rosine have been scheduled for an endoscopy. Please follow written instructions given to you at your visit today.  If you use inhalers (even only as needed), please bring them with you on the day of your procedure.  If you take any of the following medications, they will need to be adjusted prior to your procedure:   DO NOT TAKE 7 DAYS PRIOR TO TEST- Trulicity (dulaglutide) Ozempic, Wegovy (semaglutide) Mounjaro (tirzepatide) Bydureon Bcise (exanatide extended release)  DO NOT TAKE 1 DAY PRIOR TO YOUR TEST Rybelsus (semaglutide) Adlyxin (lixisenatide) Victoza (liraglutide) Byetta (exanatide) ___________________________________________________________________________  Due to recent changes in healthcare laws, you may see the results of your imaging and laboratory studies on MyChart before your provider has had a chance to review them.  We understand that in some cases there may be results that are  confusing or concerning to you. Not all laboratory results come back in the same time frame and the provider may be waiting for multiple results in order to  interpret others.  Please give us  48 hours in order for your provider to thoroughly review all the results before contacting the office for clarification of your results.   Thank you for choosing me and [facility].  Dr. Wilhelmenia

## 2023-12-08 NOTE — Progress Notes (Signed)
 GASTROENTEROLOGY OUTPATIENT CLINIC VISIT   Primary Care Provider Catalina Bare, MD [address] [place] [address] [phone]  Patient Profile: Patricia Hart is a 63 y.o. 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  The patient presents to the [facility] for an evaluation and management of problem(s) noted below:  Problem List 1. Duodenal stenosis   2. Unintended weight loss   3. Bilious vomiting with nausea   4. Colon cancer screening    Discussed the use of AI scribe software for clinical note transcription with the patient, who gave verbal consent to proceed.  History of Present Illness Please see prior notes for full details of HPI.  Interval History Patricia Hart is a 63 year old female who presents for follow-up.  We haven't seen her in over a year.  However, she has sensation and symptoms suggestive of her prior duodenal stricture/stenosis having recurred.  She has been experiencing recurrent symptoms of nausea and vomiting.  She is having bloating and distention at times.  Her last significant episode occurred in June, during which she vomited continuously for five days.  Despite dietary adjustments, such as consuming very small amounts of bland food like liquid mashed potatoes, protein drinks, and smoothies, she has noticed a slight weight loss over the past three months.  Her last blood work in June showed low potassium (right after her episode).  She has not had any imaging studies since her last dilation.  She has not had any hematemesis or melena.  She is not taking any NSAIDs.   GI Review of Systems Positive as above Negative for melena, hematochezia   Review of Systems General: Denies  fevers/chills Cardiovascular: Denies chest pain Pulmonary: Denies shortness of breath Gastroenterological: See HPI Genitourinary: Denies darkened urine Hematological: Denies easy bruising/bleeding Dermatological: Denies jaundice Psychological: Mood is stable   Medications Current Outpatient Medications  Medication Sig Dispense Refill   acetaminophen  (TYLENOL ) 500 MG tablet Take 1,500 mg by mouth every 6 (six) hours as needed for mild pain.     aspirin 325 MG tablet Take 325 mg by mouth daily.     b complex vitamins capsule Take 1 capsule by mouth daily.     diclofenac Sodium (VOLTAREN) 1 % GEL Apply 1 application topically daily.     guaiFENesin (MUCINEX) 600 MG 12 hr tablet Take 600 mg by mouth daily as needed (Allergies).     metoCLOPramide  (REGLAN ) 10 MG tablet Take 1 tablet (10 mg total) by mouth 4 (four) times daily for 8 days. (Patient taking differently: Take 10 mg by mouth as needed.) 30 tablet 0   Multiple Vitamins-Minerals (MULTIVITAMIN PO) Take 1 tablet by mouth daily.     ondansetron  (ZOFRAN ) 4 MG tablet Take 1 tablet (4 mg total) by mouth every 4 (four) hours as needed for nausea. 20 tablet 0   prochlorperazine  (COMPAZINE ) 25 MG suppository Place 1 suppository (25 mg total) rectally every 6 (six) hours as needed for nausea or vomiting. If oral anti-nausea medication is not helping 12 suppository 0   promethazine  (PHENERGAN ) 12.5 MG tablet Take 1 tablet (12.5 mg total) by mouth every 6 (six) hours as needed for nausea or vomiting. 30 tablet 2   RABEprazole  (ACIPHEX ) 20 MG tablet TAKE 1 TABLET BY MOUTH TWICE DAILY 180 tablet 0   sucralfate  (CARAFATE ) 1 g  tablet Take 1 tablet (1 g total) by mouth 4 (four) times daily -  with meals and at bedtime. 120 tablet 11   No current facility-administered medications for this visit.    Allergies Allergies  Allergen Reactions   Latex Other (See Comments)    Skin irritation    Histories Past Medical History:  Diagnosis Date    Allergy    Arthritis    Blood transfusion without reported diagnosis 1997   Clotting disorder (HCC) 1997   pregnancy induced dvt- no problems since then   Fibromyalgia    GERD (gastroesophageal reflux disease)    Hepatic steatosis 07/16/2021   History of DVT (deep vein thrombosis)    during pregnancy   History of pulmonary embolism    occurred during pregnancy   Overactive bladder    Past Surgical History:  Procedure Laterality Date   ABDOMINAL EXPOSURE N/A 02/19/2016   Procedure: ABDOMINAL EXPOSURE;  Surgeon: Krystal JULIANNA Doing, MD;  Location: [facility];  Service: Vascular;  Laterality: N/A;   ABLATION  2006   endometrial   ANTERIOR LUMBAR FUSION N/A 02/19/2016   Procedure: Anterior Lumbar Interbody Fusion  - Lumbar five-sacral one;  Surgeon: Alm GORMAN Molt, MD;  Location: [facility];  Service: Neurosurgery;  Laterality: N/A;   BACK SURGERY     BALLOON DILATION N/A 03/17/2021   Procedure: BALLOON DILATION;  Surgeon: Mansouraty, Aloha Raddle., MD;  Location: [facility];  Service: Gastroenterology;  Laterality: N/A;   BALLOON DILATION N/A 04/21/2021   Procedure: BALLOON DILATION;  Surgeon: Wilhelmenia Aloha Raddle., MD;  Location: [facility];  Service: Gastroenterology;  Laterality: N/A;   BIOPSY  03/17/2021   Procedure: BIOPSY;  Surgeon: Wilhelmenia Aloha Raddle., MD;  Location: [facility];  Service: Gastroenterology;;   breast implants     saline   CARPAL TUNNEL RELEASE Right 04/23/2015   Procedure: RIGHT CARPAL TUNNEL RELEASE;  Surgeon: Arley Curia, MD;  Location: [facility];  Service: Orthopedics;  Laterality: Right;   CARPAL TUNNEL RELEASE Left 05/16/2015   Procedure: LEFT CARPAL TUNNEL RELEASE;  Surgeon: Arley Curia, MD;  Location:  SURGERY CENTER;  Service: Orthopedics;  Laterality: Left;   COLONIC STENT PLACEMENT N/A 07/16/2021   Procedure: AXIOS STENT PLACEMENT;  Surgeon: Wilhelmenia Aloha Raddle., MD;  Location: [facility];  Service: Gastroenterology;   Laterality: N/A;   COLONOSCOPY     ESOPHAGOGASTRODUODENOSCOPY N/A 07/17/2021   Procedure: ESOPHAGOGASTRODUODENOSCOPY (EGD);  Surgeon: Teressa Toribio SQUIBB, MD;  Location: [facility];  Service: Gastroenterology;  Laterality: N/A;   ESOPHAGOGASTRODUODENOSCOPY (EGD) WITH PROPOFOL  N/A 03/17/2021   Procedure: ESOPHAGOGASTRODUODENOSCOPY (EGD) WITH PROPOFOL ;  Surgeon: Wilhelmenia Aloha Raddle., MD;  Location: [facility];  Service: Gastroenterology;  Laterality: N/A;  Fluoro   ESOPHAGOGASTRODUODENOSCOPY (EGD) WITH PROPOFOL  N/A 04/21/2021   Procedure: ESOPHAGOGASTRODUODENOSCOPY (EGD) WITH PROPOFOL ;  Surgeon: Wilhelmenia Aloha Raddle., MD;  Location: [facility];  Service: Gastroenterology;  Laterality: N/A;  fluoro   ESOPHAGOGASTRODUODENOSCOPY (EGD) WITH PROPOFOL  N/A 05/16/2021   Procedure: ESOPHAGOGASTRODUODENOSCOPY (EGD) WITH PROPOFOL ;  Surgeon: Wilhelmenia Aloha Raddle., MD;  Location: [facility];  Service: Gastroenterology;  Laterality: N/A;   ESOPHAGOGASTRODUODENOSCOPY (EGD) WITH PROPOFOL  N/A 07/16/2021   Procedure: ESOPHAGOGASTRODUODENOSCOPY (EGD) WITH PROPOFOL ;  Surgeon: Wilhelmenia Aloha Raddle., MD;  Location: [facility];  Service: Gastroenterology;  Laterality: N/A;   FOREIGN BODY REMOVAL  07/16/2021   Procedure: FOREIGN BODY REMOVAL;  Surgeon: Wilhelmenia Aloha Raddle., MD;  Location: [facility];  Service: Gastroenterology;;   CLEDA REMOVAL  07/17/2021   Procedure: STENT REMOVAL;  Surgeon: Teressa Toribio SQUIBB, MD;  Location: [facility];  Service: Gastroenterology;;   TONSILLECTOMY     TRIGGER FINGER RELEASE Right 04/23/2015   Procedure: RELEASE TRIGGER FINGER/A-1 PULLEY RIGHT THUMB;  Surgeon: Arley Curia, MD;  Location: [facility];  Service: Orthopedics;  Laterality: Right;   UPPER GASTROINTESTINAL ENDOSCOPY     WISDOM TOOTH EXTRACTION     Social History   Socioeconomic History   Marital status: Divorced    Spouse name: Not on file   Number of children: Not on file   Years of  education: Not on file   Highest education level: Not on file  Occupational History   Not on file  Tobacco Use   Smoking status: Never   Smokeless tobacco: Never  Vaping Use   Vaping status: Never Used  Substance and Sexual Activity   Alcohol use: Yes    Alcohol/week: 6.0 standard drinks of alcohol    Types: 4 Glasses of wine, 2 Standard drinks or equivalent per week    Comment: occ (currently none due to stomach issues 01/29/21)   Drug use: No   Sexual activity: Not Currently    Partners: Male    Birth control/protection: Surgical    Comment: Ablation  Other Topics Concern   Not on file  Social History Narrative   Not on file   Social Drivers of Health   Financial Resource Strain: Not on file  Food Insecurity: Not on file  Transportation Needs: Not on file  Physical Activity: Not on file  Stress: Not on file  Social Connections: Not on file  Intimate Partner Violence: Not on file   Family History  Problem Relation Age of Onset   Colon polyps Mother    Hyperlipidemia Mother    Colon cancer Mother        in her 53s or 44s   Osteoporosis Mother    Lung cancer Mother    Diabetes Father    Hyperlipidemia Father    Hypertension Father    Breast cancer Maternal Aunt        24's   Pancreatic cancer Neg Hx    Stomach cancer Neg Hx    Liver disease Neg Hx    Inflammatory bowel disease Neg Hx    Esophageal cancer Neg Hx    Rectal cancer Neg Hx    I have reviewed her medical, social, and family history in detail and updated the electronic medical record as necessary.    PHYSICAL EXAMINATION  BP 120/76 (BP Location: Left Arm, Patient Position: Sitting, Cuff Size: Normal)   Pulse 84   Ht 5' 1.5 (1.562 m) Comment: height measured without shoes  Wt 145 lb 4 oz (65.9 kg)   BMI 27.00 kg/m  Wt Readings from Last 3 Encounters:  12/08/23 145 lb 4 oz (65.9 kg)  04/28/22 130 lb (59 kg)  03/25/22 130 lb (59 kg)  GEN: NAD, appears stated age, doesn't appear chronically  ill PSYCH: Cooperative, without pressured speech EYE: Conjunctivae pink, sclerae anicteric ENT: MMM CV: Nontachycardic RESP: No audible wheezing GI: NABS, soft, nontender, nondistended, without rebound MSK/EXT: No lower extremity edema SKIN: No jaundice NEURO:  Alert & Oriented x 3, no focal deficits   REVIEW OF DATA  I reviewed the following data at the time of this encounter:  GI Procedures and Studies  Previously reviewed  Laboratory Studies  Reviewed those in epic  Imaging Studies  No new imaging studies to review   ASSESSMENT  Ms. Ehrsam  is a 62 y.o. 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  The patient is seen today for evaluation and management of:  1. Duodenal stenosis   2. Unintended weight loss   3. Bilious vomiting with nausea   4. Colon cancer screening    The patient is hemodynamically stable.  Clinically today she is okay, but symptomatology is concerning for recurrent duodenal stenosis/stricture.  Repeat endoscopic evaluation is the next step for her.  This will be a hospital-based outpatient setting procedure as her duodenal stenosis/stricture has been significant in the past and could require fluoroscopy.  Will obtain an upper GI series in the days ahead to try to define the stenosis further.  The risks and benefits of endoscopic evaluation were discussed with the patient; these include but are not limited to the risk of perforation, infection, bleeding, missed lesions, lack of diagnosis, severe illness requiring hospitalization, as well as anesthesia and sedation related illnesses.  The patient and/or family is agreeable to proceed.  We will not be planning AXIOS stenting as she had significant difficulty with tolerating that  previously.  We will discuss with the patient in the future need for colon cancer screening as she is a high risk individual due to her mother's family history of advanced adenoma and colon cancer.  All patient questions were answered to the best of my ability, and the patient agrees to the aforementioned plan of action with follow-up as indicated.   PLAN  Continue PPI twice daily Continue Carafate  twice daily Continue current nutritional modification as well as gastroparesis-like diet as you are doing EGD for evaluation of previous duodenal stricture and dilation Colonoscopy at some point hopefully this year for high risk colon cancer screening   Orders Placed This Encounter  Procedures   Procedural/ Surgical Case Request: EGD (ESOPHAGOGASTRODUODENOSCOPY)   DG UGI W SMALL BOWEL SINGLE CM   Ambulatory referral to Gastroenterology    New Prescriptions   No medications on file   Modified Medications   No medications on file    Planned Follow Up No follow-ups on file.   Total Time in Face-to-Face and in Coordination of Care for patient including independent/personal interpretation/review of prior testing, medical history, examination, medication adjustment, communicating results with the patient directly, and documentation with the EHR is 25 minutes.   Aloha Finner, MD [facility] Advanced Endoscopy Office # [phone]

## 2023-12-12 ENCOUNTER — Encounter: Payer: Self-pay | Admitting: Gastroenterology

## 2023-12-12 DIAGNOSIS — K315 Obstruction of duodenum: Secondary | ICD-10-CM | POA: Insufficient documentation

## 2023-12-12 DIAGNOSIS — R634 Abnormal weight loss: Secondary | ICD-10-CM | POA: Insufficient documentation

## 2023-12-15 ENCOUNTER — Other Ambulatory Visit: Payer: Self-pay | Admitting: Gastroenterology

## 2023-12-16 ENCOUNTER — Other Ambulatory Visit (HOSPITAL_COMMUNITY): Payer: Self-pay

## 2023-12-16 ENCOUNTER — Telehealth: Payer: Self-pay

## 2023-12-16 NOTE — Telephone Encounter (Signed)
 Pharmacy Patient Advocate Encounter  Received notification from [facility] that Prior Authorization for RABEprazole  Sodium 20MG  dr tablets has been APPROVED from 12-16-2023 to 12-15-2024   PA #/Case ID/Reference #: [id number]

## 2023-12-16 NOTE — Telephone Encounter (Signed)
 Noted the pt has been advised

## 2023-12-16 NOTE — Telephone Encounter (Signed)
 Pharmacy Patient Advocate Encounter   Received notification from CoverMyMeds that prior authorization for RABEprazole  Sodium 20MG  dr tablets is required/requested.   Insurance verification completed.   The patient is insured through [facility] .   Per test claim: PA required; PA submitted to above mentioned insurance via CoverMyMeds Key/confirmation #/EOC [id number] Status is pending

## 2023-12-23 ENCOUNTER — Telehealth: Payer: Self-pay

## 2023-12-23 ENCOUNTER — Other Ambulatory Visit: Payer: Self-pay | Admitting: Gastroenterology

## 2023-12-23 ENCOUNTER — Ambulatory Visit (HOSPITAL_COMMUNITY)
Admission: RE | Admit: 2023-12-23 | Discharge: 2023-12-23 | Disposition: A | Discharge status: Home / Self Care | Source: Ambulatory Visit | Attending: Gastroenterology | Admitting: Gastroenterology

## 2023-12-23 DIAGNOSIS — R634 Abnormal weight loss: Secondary | ICD-10-CM | POA: Insufficient documentation

## 2023-12-23 DIAGNOSIS — K315 Obstruction of duodenum: Secondary | ICD-10-CM | POA: Diagnosis present

## 2023-12-23 DIAGNOSIS — Z1211 Encounter for screening for malignant neoplasm of colon: Secondary | ICD-10-CM

## 2023-12-23 DIAGNOSIS — R1114 Bilious vomiting: Secondary | ICD-10-CM

## 2023-12-23 NOTE — Telephone Encounter (Signed)
 Call report received from radiology for UGI completed today.  Please see impression #1 and #2

## 2023-12-23 NOTE — Telephone Encounter (Signed)
 Reviewed. Thanks. I'll place a results note later today to you. GM

## 2023-12-24 ENCOUNTER — Ambulatory Visit: Payer: Self-pay | Admitting: Gastroenterology

## 2024-02-10 ENCOUNTER — Telehealth: Payer: Self-pay

## 2024-02-10 NOTE — Telephone Encounter (Signed)
 Procedure:EGD Procedure date: 02/14/24 Procedure location: [facility] Arrival Time: 1:15 Spoke with the patient Y/N: Y Any prep concerns? N  Has the patient obtained the prep from the pharmacy ? N Do you have a care partner and transportation: Y Any additional concerns? N

## 2024-02-14 ENCOUNTER — Encounter (HOSPITAL_COMMUNITY): Payer: Self-pay | Admitting: Gastroenterology

## 2024-02-14 ENCOUNTER — Ambulatory Visit (HOSPITAL_BASED_OUTPATIENT_CLINIC_OR_DEPARTMENT_OTHER): Admitting: Certified Registered Nurse Anesthetist

## 2024-02-14 ENCOUNTER — Telehealth: Payer: Self-pay

## 2024-02-14 ENCOUNTER — Other Ambulatory Visit: Payer: Self-pay

## 2024-02-14 ENCOUNTER — Ambulatory Visit (HOSPITAL_COMMUNITY): Admitting: Certified Registered Nurse Anesthetist

## 2024-02-14 ENCOUNTER — Encounter (HOSPITAL_COMMUNITY)
Admission: RE | Disposition: A | Discharge status: Home / Self Care | Payer: Self-pay | Source: Home / Self Care | Attending: Gastroenterology

## 2024-02-14 ENCOUNTER — Ambulatory Visit (HOSPITAL_COMMUNITY)
Admission: RE | Admit: 2024-02-14 | Discharge: 2024-02-14 | Disposition: A | Discharge status: Home / Self Care | Attending: Gastroenterology | Admitting: Gastroenterology

## 2024-02-14 DIAGNOSIS — R1310 Dysphagia, unspecified: Secondary | ICD-10-CM | POA: Diagnosis not present

## 2024-02-14 DIAGNOSIS — K760 Fatty (change of) liver, not elsewhere classified: Secondary | ICD-10-CM | POA: Insufficient documentation

## 2024-02-14 DIAGNOSIS — K449 Diaphragmatic hernia without obstruction or gangrene: Secondary | ICD-10-CM

## 2024-02-14 DIAGNOSIS — N3281 Overactive bladder: Secondary | ICD-10-CM | POA: Diagnosis not present

## 2024-02-14 DIAGNOSIS — Z86718 Personal history of other venous thrombosis and embolism: Secondary | ICD-10-CM | POA: Diagnosis not present

## 2024-02-14 DIAGNOSIS — K315 Obstruction of duodenum: Secondary | ICD-10-CM | POA: Diagnosis present

## 2024-02-14 DIAGNOSIS — M797 Fibromyalgia: Secondary | ICD-10-CM | POA: Diagnosis not present

## 2024-02-14 DIAGNOSIS — K3189 Other diseases of stomach and duodenum: Secondary | ICD-10-CM | POA: Diagnosis not present

## 2024-02-14 DIAGNOSIS — K222 Esophageal obstruction: Secondary | ICD-10-CM

## 2024-02-14 DIAGNOSIS — K2950 Unspecified chronic gastritis without bleeding: Secondary | ICD-10-CM | POA: Diagnosis not present

## 2024-02-14 DIAGNOSIS — M1990 Unspecified osteoarthritis, unspecified site: Secondary | ICD-10-CM | POA: Insufficient documentation

## 2024-02-14 DIAGNOSIS — Z86711 Personal history of pulmonary embolism: Secondary | ICD-10-CM | POA: Insufficient documentation

## 2024-02-14 DIAGNOSIS — R634 Abnormal weight loss: Secondary | ICD-10-CM

## 2024-02-14 DIAGNOSIS — K219 Gastro-esophageal reflux disease without esophagitis: Secondary | ICD-10-CM | POA: Diagnosis not present

## 2024-02-14 DIAGNOSIS — Z9289 Personal history of other medical treatment: Secondary | ICD-10-CM

## 2024-02-14 HISTORY — PX: ESOPHAGOGASTRODUODENOSCOPY: SHX5428

## 2024-02-14 SURGERY — EGD (ESOPHAGOGASTRODUODENOSCOPY)
Anesthesia: Monitor Anesthesia Care

## 2024-02-14 MED ORDER — LIDOCAINE 2% (20 MG/ML) 5 ML SYRINGE
INTRAMUSCULAR | Status: DC | PRN
  Administered 2024-02-14: 60 mg via INTRAVENOUS

## 2024-02-14 MED ORDER — ONDANSETRON HCL 4 MG/2ML IJ SOLN
INTRAMUSCULAR | Status: DC | PRN
  Administered 2024-02-14: 4 mg via INTRAVENOUS

## 2024-02-14 MED ORDER — SODIUM CHLORIDE 0.9 % IV SOLN: INTRAVENOUS | Status: DC

## 2024-02-14 MED ORDER — PROPOFOL 500 MG/50ML IV EMUL
INTRAVENOUS | Status: DC | PRN
  Administered 2024-02-14: 175 ug/kg/min via INTRAVENOUS

## 2024-02-14 MED ORDER — PROPOFOL 10 MG/ML IV BOLUS
INTRAVENOUS | Status: DC | PRN
  Administered 2024-02-14 (×2): 20 mg via INTRAVENOUS

## 2024-02-14 MED ORDER — RABEPRAZOLE SODIUM 20 MG PO TBEC: 20.0000 mg | DELAYED_RELEASE_TABLET | Freq: Two times a day (BID) | ORAL | 4 refills | Status: AC

## 2024-02-14 MED ORDER — PROPOFOL 1000 MG/100ML IV EMUL
INTRAVENOUS | Status: AC
  Filled 2024-02-14: qty 100

## 2024-02-14 NOTE — Discharge Instructions (Signed)

## 2024-02-14 NOTE — Telephone Encounter (Signed)
-----   Message from [facility] sent at 02/14/2024  2:05 PM EDT ----- Regarding: Repeat EGD needed Noretta Frier, This patient needs repeat EGD with me in hospital-based setting 3 to 5 weeks for duodenal stenosis dilation. Let me know what dates you think are possible for her. Thanks. GM

## 2024-02-14 NOTE — H&P (Signed)
 GASTROENTEROLOGY PROCEDURE H&P NOTE   Primary Care Physician: Catalina Bare, MD  HPI: Patricia Hart is a 63 y.o. 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  Past Medical History:  Diagnosis Date   Allergy    Arthritis    Blood transfusion without reported diagnosis 1997   Clotting disorder (HCC) 1997   pregnancy induced dvt- no problems since then   Fibromyalgia    GERD (gastroesophageal reflux disease)    Hepatic steatosis 07/16/2021   History of DVT (deep vein thrombosis)    during pregnancy   History of pulmonary embolism    occurred during pregnancy   Overactive bladder    Past Surgical History:  Procedure Laterality Date   ABDOMINAL EXPOSURE N/A 02/19/2016   Procedure: ABDOMINAL EXPOSURE;  Surgeon: Krystal JULIANNA Doing, MD;  Location: [facility];  Service: Vascular;  Laterality: N/A;   ABLATION  2006   endometrial   ANTERIOR LUMBAR FUSION N/A 02/19/2016   Procedure: Anterior Lumbar Interbody Fusion  - Lumbar five-sacral one;  Surgeon: Alm GORMAN Molt, MD;  Location: [facility];  Service: Neurosurgery;  Laterality: N/A;   BACK SURGERY     BALLOON DILATION N/A 03/17/2021   Procedure: BALLOON DILATION;  Surgeon: Mansouraty, Aloha Raddle., MD;  Location: [facility];  Service: Gastroenterology;  Laterality: N/A;   BALLOON DILATION N/A 04/21/2021   Procedure: BALLOON DILATION;  Surgeon: Wilhelmenia Aloha Raddle., MD;  Location: [facility];  Service: Gastroenterology;  Laterality: N/A;   BIOPSY  03/17/2021   Procedure: BIOPSY;  Surgeon: Wilhelmenia Aloha Raddle., MD;  Location: [facility];  Service: Gastroenterology;;   breast implants     saline   CARPAL TUNNEL RELEASE Right 04/23/2015   Procedure: RIGHT CARPAL TUNNEL RELEASE;  Surgeon: Arley Curia, MD;  Location: [facility];  Service: Orthopedics;  Laterality: Right;   CARPAL TUNNEL  RELEASE Left 05/16/2015   Procedure: LEFT CARPAL TUNNEL RELEASE;  Surgeon: Arley Curia, MD;  Location: [facility];  Service: Orthopedics;  Laterality: Left;   COLONIC STENT PLACEMENT N/A 07/16/2021   Procedure: AXIOS STENT PLACEMENT;  Surgeon: Wilhelmenia Aloha Raddle., MD;  Location: [facility];  Service: Gastroenterology;  Laterality: N/A;   COLONOSCOPY     ESOPHAGOGASTRODUODENOSCOPY N/A 07/17/2021   Procedure: ESOPHAGOGASTRODUODENOSCOPY (EGD);  Surgeon: Teressa Toribio SQUIBB, MD;  Location: [facility];  Service: Gastroenterology;  Laterality: N/A;   ESOPHAGOGASTRODUODENOSCOPY (EGD) WITH PROPOFOL  N/A 03/17/2021   Procedure: ESOPHAGOGASTRODUODENOSCOPY (EGD) WITH PROPOFOL ;  Surgeon: Wilhelmenia Aloha Raddle., MD;  Location: [facility];  Service: Gastroenterology;  Laterality: N/A;  Fluoro   ESOPHAGOGASTRODUODENOSCOPY (EGD) WITH PROPOFOL  N/A 04/21/2021   Procedure: ESOPHAGOGASTRODUODENOSCOPY (EGD) WITH PROPOFOL ;  Surgeon: Wilhelmenia Aloha Raddle., MD;  Location: [facility];  Service: Gastroenterology;  Laterality: N/A;  fluoro   ESOPHAGOGASTRODUODENOSCOPY (EGD) WITH PROPOFOL  N/A 05/16/2021   Procedure: ESOPHAGOGASTRODUODENOSCOPY (EGD) WITH PROPOFOL ;  Surgeon: Wilhelmenia Aloha Raddle., MD;  Location: [facility];  Service: Gastroenterology;  Laterality: N/A;   ESOPHAGOGASTRODUODENOSCOPY (EGD) WITH PROPOFOL  N/A 07/16/2021   Procedure: ESOPHAGOGASTRODUODENOSCOPY (EGD) WITH PROPOFOL ;  Surgeon: Wilhelmenia Aloha Raddle., MD;  Location: [facility];  Service: Gastroenterology;  Laterality: N/A;   FOREIGN BODY REMOVAL  07/16/2021   Procedure: FOREIGN BODY REMOVAL;  Surgeon: Wilhelmenia Aloha Raddle., MD;  Location: [facility];  Service: Gastroenterology;;   CLEDA REMOVAL  07/17/2021   Procedure: STENT REMOVAL;  Surgeon: Teressa Toribio SQUIBB, MD;  Location: [facility];  Service:  Gastroenterology;;   TONSILLECTOMY     TRIGGER FINGER RELEASE Right 04/23/2015   Procedure: RELEASE TRIGGER FINGER/A-1  PULLEY RIGHT THUMB;  Surgeon: Arley Curia, MD;  Location: [facility];  Service: Orthopedics;  Laterality: Right;   UPPER GASTROINTESTINAL ENDOSCOPY     WISDOM TOOTH EXTRACTION     Current Facility-Administered Medications  Medication Dose Route Frequency Provider Last Rate Last Admin   0.9 %  sodium chloride  infusion   Intravenous Continuous Mansouraty, Aloha Raddle., MD        Current Facility-Administered Medications:    0.9 %  sodium chloride  infusion, , Intravenous, Continuous, Mansouraty, Aloha Raddle., MD Allergies  Allergen Reactions   Latex Other (See Comments)    Skin irritation   Family History  Problem Relation Age of Onset   Colon polyps Mother    Hyperlipidemia Mother    Colon cancer Mother        in her 31s or 20s   Osteoporosis Mother    Lung cancer Mother    Diabetes Father    Hyperlipidemia Father    Hypertension Father    Breast cancer Maternal Aunt        1's   Pancreatic cancer Neg Hx    Stomach cancer Neg Hx    Liver disease Neg Hx    Inflammatory bowel disease Neg Hx    Esophageal cancer Neg Hx    Rectal cancer Neg Hx    Social History   Socioeconomic History   Marital status: Divorced    Spouse name: Not on file   Number of children: Not on file   Years of education: Not on file   Highest education level: Not on file  Occupational History   Not on file  Tobacco Use   Smoking status: Never   Smokeless tobacco: Never  Vaping Use   Vaping status: Never Used  Substance and Sexual Activity   Alcohol use: Yes    Alcohol/week: 6.0 standard drinks of alcohol    Types: 4 Glasses of wine, 2 Standard drinks or equivalent per week    Comment: occ (currently none due to stomach issues 01/29/21)   Drug use: No   Sexual activity: Not Currently    Partners: Male    Birth control/protection: Surgical    Comment: Ablation  Other Topics Concern   Not on file  Social History Narrative   Not on file   Social Drivers of Health   Financial  Resource Strain: Not on file  Food Insecurity: Not on file  Transportation Needs: Not on file  Physical Activity: Not on file  Stress: Not on file  Social Connections: Not on file  Intimate Partner Violence: Not on file    Physical Exam: There were no vitals filed for this visit. There is no height or weight on file to calculate BMI. GEN: NAD EYE: Sclerae anicteric ENT: MMM CV: Non-tachycardic GI: Soft, NT/ND NEURO:  Alert & Oriented x 3  Lab Results: No results for input(s): WBC, HGB, HCT, PLT in the last 72 hours. BMET No results for input(s): NA, K, CL, CO2, GLUCOSE, BUN, CREATININE, CALCIUM in the last 72 hours. LFT No results for input(s): PROT, ALBUMIN, AST, ALT, ALKPHOS, BILITOT, BILIDIR, IBILI in the last 72 hours. PT/INR No results for input(s): LABPROT, INR in the last 72 hours.   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  The risks and benefits of endoscopic evaluation/treatment were discussed with the patient and/or family; these include but are not limited to the risk of perforation, infection, bleeding, missed lesions, lack of diagnosis, severe illness requiring hospitalization, as well as anesthesia and sedation related illnesses.  The patient's history has been reviewed, patient examined, no change in status, and deemed stable for procedure.  The patient and/or family is agreeable to proceed.    Aloha Finner, MD [facility] Office # [phone]

## 2024-02-14 NOTE — Op Note (Signed)
 [facility] Patient Name: Patricia Hart Procedure Date: 02/14/2024 MRN: [id number] Attending MD: Aloha Finner , MD, [id number] Date of Birth: 1961/05/23 CSN: [id number] Age: 63 Admit Type: Outpatient Procedure:                Upper GI endoscopy Indications:              Epigastric abdominal pain, Dysphagia, Stenosis of                            the duodenum, For therapy of duodenal stenosis,                            Abnormal UGI series, Abdominal bloating Providers:                Aloha Finner, MD, Olam Riedel, RN, Corene Southgate,                            Technician Referring MD:              Medicines:                Monitored Anesthesia Care Complications:            No immediate complications. Estimated Blood Loss:     Estimated blood loss was minimal. Procedure:                Pre-Anesthesia Assessment:                           - Prior to the procedure, a History and Physical                            was performed, and patient medications and                            allergies were reviewed. The patient's tolerance of                            previous anesthesia was also reviewed. The risks                            and benefits of the procedure and the sedation                            options and risks were discussed with the patient.                            All questions were answered, and informed consent                            was obtained. Prior Anticoagulants: The patient has                            taken no anticoagulant or antiplatelet agents  except for NSAID medication. ASA Grade Assessment:                            III - A patient with severe systemic disease. After                            reviewing the risks and benefits, the patient was                            deemed in satisfactory condition to undergo the                            procedure.                           After obtaining  informed consent, the endoscope was                            passed under direct vision. Throughout the                            procedure, the patient's blood pressure, pulse, and                            oxygen saturations were monitored continuously. The                            GIF-H190 ([id number]) Olympus endoscope was introduced                            through the mouth, and advanced to the second part                            of duodenum. The upper GI endoscopy was                            accomplished without difficulty. The patient                            tolerated the procedure. Scope In: Scope Out: Findings:      No gross lesions were noted in the entire esophagus. A guidewire was       placed and the scope was withdrawn. Dilation was performed with a Savary       dilator with no resistance at 16 mm and mild resistance at 17 mm. The       dilation site was examined following endoscope reinsertion and showed no       change.      A 2 cm hiatal hernia was present.      Patchy mildly erythematous mucosa without bleeding was found in the       entire examined stomach. Biopsies were taken with a cold forceps for       histology and Helicobacter pylori testing.      An acquired benign-appearing, intrinsic moderate stenosis was found in       the duodenal sweep (  approximately 6 mm in size). This was not able to be       traversed initially. A TTS dilator was passed through the scope.       Dilation with an 12-31-08 mm pyloric balloon dilator was performed. The       dilation site was examined following endoscope reinsertion and showed       moderate mucosal disruption, moderate improvement in luminal narrowing       and no perforation. After the dilation and after applying gentle       pressure with the endoscope the stenosis was traversed.      No other gross lesions were noted in the duodenal bulb, in the first       portion of the duodenum and in the second portion  of the duodenum. Impression:               - No gross lesions in the entire esophagus. Dilated                            to 17 mm Savary.                           - 2 cm hiatal hernia.                           - Erythematous mucosa in the stomach. Biopsied.                           - Acquired duodenal stenosis and duodenal sweep (6                            mm) dilated to 10 mm and able to be traversed                            thereafter.                           - No other gross lesions in the duodenal bulb, in                            the first portion of the duodenum and in the second                            portion of the duodenum. Moderate Sedation:      Not Applicable - Patient had care per Anesthesia. Recommendation:           - The patient will be observed post-procedure,                            until all discharge criteria are met.                           - Discharge patient to home.                           - Patient has a contact number available for  emergencies. The signs and symptoms of potential                            delayed complications were discussed with the                            patient. Return to normal activities tomorrow.                            Written discharge instructions were provided to the                            patient.                           - Advance diet as tolerated.                           - Continue PPI twice daily.                           - Continue Carafate  4 times daily until next                            procedure.                           - Continue present medications.                           - Await pathology results.                           - Repeat upper endoscopy in 3-5 weeks for                            retreatment and repeat dilation. Will perform this                            next 1 in the hospital-based setting and then if                            able to  enlarge this more significantly, then I                            will transition to the [facility] for her procedures.                           - The findings and recommendations were discussed                            with the patient. Procedure Code(s):        --- Professional ---                           [phone], Esophagogastroduodenoscopy, flexible,  transoral; with dilation of gastric/duodenal                            stricture(s) (eg, balloon, bougie)                           43248, Esophagogastroduodenoscopy, flexible,                            transoral; with insertion of guide wire followed by                            passage of dilator(s) through esophagus over guide                            wire                           43239, 59, Esophagogastroduodenoscopy, flexible,                            transoral; with biopsy, single or multiple Diagnosis Code(s):        --- Professional ---                           K44.9, Diaphragmatic hernia without obstruction or                            gangrene                           K31.89, Other diseases of stomach and duodenum                           K31.5, Obstruction of duodenum                           R10.13, Epigastric pain                           R13.10, Dysphagia, unspecified                           R14.0, Abdominal distension (gaseous)                           R93.3, Abnormal findings on diagnostic imaging of                            other parts of digestive tract CPT copyright 2022 [employer]. All rights reserved. The codes documented in this report are preliminary and upon coder review may  be revised to meet current compliance requirements. Aloha Finner, MD 02/14/2024 2:04:58 PM Number of Addenda: 0

## 2024-02-14 NOTE — Anesthesia Preprocedure Evaluation (Signed)
 Anesthesia Evaluation  Patient identified by MRN, date of birth, ID band Patient awake    Reviewed: Allergy & Precautions, NPO status , Patient's Chart, lab work & pertinent test results  Airway Mallampati: II  TM Distance: >3 FB Neck ROM: Full    Dental  (+) Dental Advisory Given   Pulmonary neg pulmonary ROS   breath sounds clear to auscultation       Cardiovascular + DVT   Rhythm:Regular Rate:Normal     Neuro/Psych negative neurological ROS     GI/Hepatic Neg liver ROS,GERD  ,,Duodenal stricture   Endo/Other  negative endocrine ROS    Renal/GU negative Renal ROS     Musculoskeletal  (+) Arthritis ,  Fibromyalgia -  Abdominal   Peds  Hematology negative hematology ROS (+)   Anesthesia Other Findings   Reproductive/Obstetrics                              Anesthesia Physical Anesthesia Plan  ASA: 2  Anesthesia Plan: MAC   Post-op Pain Management:    Induction:   PONV Risk Score and Plan: 2 and Propofol  infusion, Treatment may vary due to age or medical condition and Ondansetron   Airway Management Planned: Natural Airway and Nasal Cannula  Additional Equipment:   Intra-op Plan:   Post-operative Plan:   Informed Consent: I have reviewed the patients History and Physical, chart, labs and discussed the procedure including the risks, benefits and alternatives for the proposed anesthesia with the patient or authorized representative who has indicated his/her understanding and acceptance.       Plan Discussed with:   Anesthesia Plan Comments:         Anesthesia Quick Evaluation

## 2024-02-14 NOTE — Transfer of Care (Signed)
 Immediate Anesthesia Transfer of Care Note  Patient: Patricia Hart  Procedure(s) Performed: EGD (ESOPHAGOGASTRODUODENOSCOPY)  Patient Location: PACU and Endoscopy Unit  Anesthesia Type:MAC  Level of Consciousness: awake, alert , and oriented  Airway & Oxygen Therapy: Patient Spontanous Breathing and Patient connected to face mask oxygen  Post-op Assessment: Report given to RN and Post -op Vital signs reviewed and stable  Post vital signs: Reviewed and stable  Last Vitals:  Vitals Value Taken Time  BP 132/81 02/14/24 14:00  Temp    Pulse 86 02/14/24 14:00  Resp 15 02/14/24 14:00  SpO2 100 % 02/14/24 14:00  Vitals shown include unfiled device data.  Last Pain:  Vitals:   02/14/24 1316  TempSrc: Temporal  PainSc: 0-No pain      Patients Stated Pain Goal: 0 (02/14/24 1316)  Complications: No notable events documented.

## 2024-02-15 ENCOUNTER — Other Ambulatory Visit: Payer: Self-pay

## 2024-02-15 ENCOUNTER — Ambulatory Visit: Payer: Self-pay | Admitting: Gastroenterology

## 2024-02-15 DIAGNOSIS — K315 Obstruction of duodenum: Secondary | ICD-10-CM

## 2024-02-15 LAB — SURGICAL PATHOLOGY

## 2024-02-15 NOTE — Telephone Encounter (Signed)
 EGD has been set up at [facility] on 10/27 at 115 pm with GM

## 2024-02-15 NOTE — Telephone Encounter (Signed)
 The pt has been advised of the EGD appt on 10/27  The pt has complaints of nausea and vomiting since EGD yesterday. She states this has improved since last night. She would like Dr Wilhelmenia to be aware

## 2024-02-15 NOTE — Telephone Encounter (Signed)
 Thanks for update. GM

## 2024-02-16 ENCOUNTER — Encounter (HOSPITAL_COMMUNITY): Payer: Self-pay | Admitting: Gastroenterology

## 2024-02-16 NOTE — Anesthesia Postprocedure Evaluation (Signed)
 Anesthesia Post Note  Patient: Patricia Hart  Procedure(s) Performed: EGD (ESOPHAGOGASTRODUODENOSCOPY)     Patient location during evaluation: PACU Anesthesia Type: MAC Level of consciousness: awake and alert Pain management: pain level controlled Vital Signs Assessment: post-procedure vital signs reviewed and stable Respiratory status: spontaneous breathing, nonlabored ventilation, respiratory function stable and patient connected to nasal cannula oxygen Cardiovascular status: blood pressure returned to baseline and stable Postop Assessment: no apparent nausea or vomiting Anesthetic complications: no   No notable events documented.  Last Vitals:  Vitals:   02/14/24 1410 02/14/24 1415  BP: 124/74   Pulse: 85 74  Resp: (!) 30 16  Temp:    SpO2: 98% 97%    Last Pain:  Vitals:   02/15/24 1628  TempSrc:   PainSc: 0-No pain                 Epifanio Lamar BRAVO

## 2024-02-17 ENCOUNTER — Encounter (HOSPITAL_COMMUNITY): Payer: Self-pay

## 2024-02-17 ENCOUNTER — Other Ambulatory Visit: Payer: Self-pay

## 2024-02-17 ENCOUNTER — Emergency Department (HOSPITAL_COMMUNITY)

## 2024-02-17 ENCOUNTER — Emergency Department (HOSPITAL_COMMUNITY)
Admission: EM | Admit: 2024-02-17 | Discharge: 2024-02-17 | Disposition: A | Discharge status: Home / Self Care | Attending: Emergency Medicine | Admitting: Emergency Medicine

## 2024-02-17 DIAGNOSIS — R112 Nausea with vomiting, unspecified: Secondary | ICD-10-CM | POA: Diagnosis present

## 2024-02-17 DIAGNOSIS — R109 Unspecified abdominal pain: Secondary | ICD-10-CM | POA: Insufficient documentation

## 2024-02-17 DIAGNOSIS — Z91040 Latex allergy status: Secondary | ICD-10-CM | POA: Diagnosis not present

## 2024-02-17 DIAGNOSIS — D72829 Elevated white blood cell count, unspecified: Secondary | ICD-10-CM | POA: Insufficient documentation

## 2024-02-17 DIAGNOSIS — Z7982 Long term (current) use of aspirin: Secondary | ICD-10-CM | POA: Insufficient documentation

## 2024-02-17 LAB — URINALYSIS, ROUTINE W REFLEX MICROSCOPIC
Bacteria, UA: NONE SEEN
Bilirubin Urine: NEGATIVE
Glucose, UA: NEGATIVE mg/dL
Ketones, ur: 20 mg/dL — AB
Leukocytes,Ua: NEGATIVE
Nitrite: NEGATIVE
Protein, ur: 100 mg/dL — AB
Specific Gravity, Urine: 1.023 (ref 1.005–1.030)
pH: 6 (ref 5.0–8.0)

## 2024-02-17 LAB — CBC
HCT: 44.4 % (ref 36.0–46.0)
Hemoglobin: 15.4 g/dL — ABNORMAL HIGH (ref 12.0–15.0)
MCH: 31 pg (ref 26.0–34.0)
MCHC: 34.7 g/dL (ref 30.0–36.0)
MCV: 89.3 fL (ref 80.0–100.0)
Platelets: 311 K/uL (ref 150–400)
RBC: 4.97 MIL/uL (ref 3.87–5.11)
RDW: 11.9 % (ref 11.5–15.5)
WBC: 11 K/uL — ABNORMAL HIGH (ref 4.0–10.5)
nRBC: 0 % (ref 0.0–0.2)

## 2024-02-17 LAB — COMPREHENSIVE METABOLIC PANEL WITH GFR
ALT: 39 U/L (ref 0–44)
AST: 34 U/L (ref 15–41)
Albumin: 4.7 g/dL (ref 3.5–5.0)
Alkaline Phosphatase: 82 U/L (ref 38–126)
Anion gap: 14 (ref 5–15)
BUN: 12 mg/dL (ref 8–23)
CO2: 23 mmol/L (ref 22–32)
Calcium: 10 mg/dL (ref 8.9–10.3)
Chloride: 93 mmol/L — ABNORMAL LOW (ref 98–111)
Creatinine, Ser: 0.78 mg/dL (ref 0.44–1.00)
GFR, Estimated: >60 mL/min (ref >60)
Glucose, Bld: 119 mg/dL — ABNORMAL HIGH (ref 70–99)
Potassium: 3.7 mmol/L (ref 3.5–5.1)
Sodium: 130 mmol/L — ABNORMAL LOW (ref 135–145)
Total Bilirubin: 0.8 mg/dL (ref 0.0–1.2)
Total Protein: 7.7 g/dL (ref 6.5–8.1)

## 2024-02-17 LAB — LIPASE, BLOOD: Lipase: 15 U/L (ref 11–51)

## 2024-02-17 MED ORDER — ONDANSETRON HCL 4 MG/2ML IJ SOLN
4.0000 mg | Freq: Once | INTRAMUSCULAR | Status: AC
  Administered 2024-02-17: 4 mg via INTRAVENOUS
  Filled 2024-02-17: qty 2

## 2024-02-17 MED ORDER — SODIUM CHLORIDE 0.9 % IV BOLUS
1000.0000 mL | Freq: Once | INTRAVENOUS | Status: AC
  Administered 2024-02-17: 1000 mL via INTRAVENOUS

## 2024-02-17 MED ORDER — ONDANSETRON HCL 4 MG/2ML IJ SOLN
4.0000 mg | Freq: Once | INTRAMUSCULAR | Status: AC | PRN
  Administered 2024-02-17: 4 mg via INTRAVENOUS
  Filled 2024-02-17: qty 2

## 2024-02-17 MED ORDER — MORPHINE SULFATE (PF) 4 MG/ML IV SOLN
4.0000 mg | Freq: Once | INTRAVENOUS | Status: AC
  Administered 2024-02-17: 4 mg via INTRAVENOUS
  Filled 2024-02-17: qty 1

## 2024-02-17 MED ORDER — IOHEXOL 300 MG/ML  SOLN
100.0000 mL | Freq: Once | INTRAMUSCULAR | Status: AC | PRN
  Administered 2024-02-17: 100 mL via INTRAVENOUS

## 2024-02-17 MED ORDER — ONDANSETRON 4 MG PO TBDP: 4.0000 mg | ORAL_TABLET | Freq: Three times a day (TID) | ORAL | 0 refills | Status: AC | PRN

## 2024-02-17 NOTE — ED Triage Notes (Signed)
 Pt ambulatory to triage with complaints of vomiting x4 days and the inability to keep down food. Pt reports a hx of duodenal stenosis. Deneis diarrhea. Pt is hoarse in triage.

## 2024-02-17 NOTE — ED Notes (Signed)
Pt tolerates po challenge.

## 2024-02-17 NOTE — Telephone Encounter (Signed)
 Inbound call form patient states she is still having trouble with nausea. Requesting to speak with a nurse.please advise.

## 2024-02-17 NOTE — Discharge Instructions (Signed)
 It was a pleasure taking care of you here today  Make sure to follow-up outpatient with your GI doctor, return for any worsening symptoms

## 2024-02-17 NOTE — ED Provider Notes (Signed)
 [place] EMERGENCY DEPARTMENT AT [facility] Provider Note   CSN: [id number] Arrival date & time: 02/17/24  1059    Patient presents with: Emesis   Patricia Hart is a 63 y.o. female history of recurrent nausea and vomiting, duodenal stenosis, recent EGD here for evaluation of nausea vomiting abdominal pain.  She states she had dilation and EGD on 02/14/24.  She states it feels like something is closed up in my stomach.  She has been able to keep down 1 or 2 sips of water however unable to keep down any solid food or anything more than 1 sip of water.  She has been doing Phenergan  suppositories at home without relief.  She denies any hematemesis, chest pain, shortness of breath, dysuria, hematuria.  States she is having bowel movements, passing flatus.  Follows with Mansouraty with [facility].   HPI     Prior to Admission medications   Medication Sig Start Date End Date Taking? Authorizing Provider  ondansetron  (ZOFRAN -ODT) 4 MG disintegrating tablet Take 1 tablet (4 mg total) by mouth every 8 (eight) hours as needed. 02/17/24  Yes Ori Kreiter A, PA-C  acetaminophen  (TYLENOL ) 500 MG tablet Take 1,500 mg by mouth every 6 (six) hours as needed for mild pain.    [provider]  aspirin 325 MG tablet Take 325 mg by mouth daily.    [provider]  b complex vitamins capsule Take 1 capsule by mouth daily.    [provider]  diclofenac Sodium (VOLTAREN) 1 % GEL Apply 1 application topically daily.    [provider]  guaiFENesin (MUCINEX) 600 MG 12 hr tablet Take 600 mg by mouth daily as needed (Allergies).    [provider]  metoCLOPramide  (REGLAN ) 10 MG tablet Take 1 tablet (10 mg total) by mouth 4 (four) times daily for 8 days. Patient taking differently: Take 10 mg by mouth as needed. 07/19/21 12/08/23  Cheryle Page, MD  Multiple Vitamins-Minerals (MULTIVITAMIN PO) Take 1 tablet by mouth daily.    [provider]   ondansetron  (ZOFRAN ) 4 MG tablet Take 1 tablet (4 mg total) by mouth every 4 (four) hours as needed for nausea. 07/18/21   Cheryle Page, MD  prochlorperazine  (COMPAZINE ) 25 MG suppository Place 1 suppository (25 mg total) rectally every 6 (six) hours as needed for nausea or vomiting. If oral anti-nausea medication is not helping 07/18/21   Cheryle Page, MD  promethazine  (PHENERGAN ) 12.5 MG tablet TAKE 1 TABLET(12.5 MG) BY MOUTH EVERY 6 HOURS AS NEEDED FOR NAUSEA OR VOMITING 12/15/23   Mansouraty, Aloha Raddle., MD  RABEprazole  (ACIPHEX ) 20 MG tablet Take 1 tablet (20 mg total) by mouth 2 (two) times daily. 02/14/24   Mansouraty, Aloha Raddle., MD  sucralfate  (CARAFATE ) 1 g tablet Take 1 tablet (1 g total) by mouth 4 (four) times daily -  with meals and at bedtime. 07/16/21   Mansouraty, Aloha Raddle., MD    Allergies: Latex    Review of Systems  Constitutional: Negative.   HENT: Negative.    Respiratory: Negative.    Cardiovascular: Negative.   Gastrointestinal:  Positive for abdominal pain, nausea and vomiting. Negative for abdominal distention, anal bleeding, blood in stool, constipation, diarrhea and rectal pain.  Genitourinary: Negative.   Musculoskeletal: Negative.   Skin: Negative.   Neurological: Negative.   All other systems reviewed and are negative.   Updated Vital Signs BP (!) 132/90 (BP Location: Right Arm)   Pulse 82   Temp 98.6 F (  37 C) (Oral)   Resp 17   SpO2 98%   Physical Exam Vitals and nursing note reviewed.  Constitutional:      General: She is not in acute distress.    Appearance: She is well-developed. She is not ill-appearing, toxic-appearing or diaphoretic.  HENT:     Head: Normocephalic and atraumatic.     Nose: Nose normal.     Mouth/Throat:     Mouth: Mucous membranes are moist.  Eyes:     Pupils: Pupils are equal, round, and reactive to light.  Neck:     Trachea: Trachea and phonation normal.     Comments: No stridor, full range of motion, no  crepitus Cardiovascular:     Rate and Rhythm: Normal rate.     Pulses: Normal pulses.          Radial pulses are 2+ on the right side and 2+ on the left side.       Dorsalis pedis pulses are 2+ on the right side and 2+ on the left side.     Heart sounds: Normal heart sounds.  Pulmonary:     Effort: Pulmonary effort is normal. No respiratory distress.     Breath sounds: Normal breath sounds.  Chest:     Comments: Nontender, no crepitus or step-off Abdominal:     General: There is no distension.     Palpations: There is no mass.     Tenderness: There is abdominal tenderness. There is no right CVA tenderness, left CVA tenderness, guarding or rebound.   Musculoskeletal:        General: No swelling, tenderness, deformity or signs of injury. Normal range of motion.     Cervical back: Full passive range of motion without pain and normal range of motion.     Right lower leg: No edema.     Left lower leg: No edema.  Skin:    General: Skin is warm and dry.     Capillary Refill: Capillary refill takes less than 2 seconds.  Neurological:     General: No focal deficit present.     Mental Status: She is alert and oriented to person, place, and time.     Cranial Nerves: No cranial nerve deficit.     Motor: No weakness.     Gait: Gait normal.  Psychiatric:        Mood and Affect: Mood normal.    (all labs ordered are listed, but only abnormal results are displayed) Labs Reviewed  COMPREHENSIVE METABOLIC PANEL WITH GFR - Abnormal; Notable for the following components:      Result Value   Sodium 130 (*)    Chloride 93 (*)    Glucose, Bld 119 (*)    All other components within normal limits  CBC - Abnormal; Notable for the following components:   WBC 11.0 (*)    Hemoglobin 15.4 (*)    All other components within normal limits  URINALYSIS, ROUTINE W REFLEX MICROSCOPIC - Abnormal; Notable for the following components:   Hgb urine dipstick MODERATE (*)    Ketones, ur 20 (*)    Protein, ur  100 (*)    All other components within normal limits  LIPASE, BLOOD    EKG: EKG Interpretation Date/Time:  Thursday February 17 2024 12:48:25 EDT Ventricular Rate:  73 PR Interval:  147 QRS Duration:  87 QT Interval:  369 QTC Calculation: 407 R Axis:   7  Text Interpretation: Sinus rhythm Left atrial enlargement No significant change  since prior 8/22 Confirmed by Towana Sharper [phone]) on 02/17/2024 12:56:37 PM  Radiology: CT ABDOMEN PELVIS W CONTRAST Result Date: 02/17/2024 CLINICAL DATA:  Epigastric pain. EXAM: CT ABDOMEN AND PELVIS WITH CONTRAST TECHNIQUE: Multidetector CT imaging of the abdomen and pelvis was performed using the standard protocol following bolus administration of intravenous contrast. RADIATION DOSE REDUCTION: This exam was performed according to the departmental dose-optimization program which includes automated exposure control, adjustment of the mA and/or kV according to patient size and/or use of iterative reconstruction technique. CONTRAST:  OMNIPAQUE  IOHEXOL  300 MG/ML  SOLN COMPARISON:  CT abdomen 07/16/2021. FINDINGS: Lower chest: There are atelectatic changes in the bilateral lower lobes. Hepatobiliary: There is diffuse fatty infiltration of the liver. There is a 16 mm rounded cyst in the left lobe gallbladder and bile ducts are within normal limits. Pancreas: Unremarkable. No pancreatic ductal dilatation or surrounding inflammatory changes. Spleen: Normal in size without focal abnormality. Adrenals/Urinary Tract: Adrenal glands are unremarkable. Kidneys are normal, without renal calculi, focal lesion, or hydronephrosis. Bladder is unremarkable. Stomach/Bowel: Stomach is within normal limits. Appendix measures up to 7 mm. There is no surrounding inflammation. No evidence of bowel wall thickening, distention, or inflammatory changes. There scattered colonic diverticula. Vascular/Lymphatic: Aortic atherosclerosis. No enlarged abdominal or pelvic lymph nodes.  Reproductive: Uterus and bilateral adnexa are unremarkable. Other: No abdominal wall hernia or abnormality. No abdominopelvic ascites. Musculoskeletal: Anterior fusion plate is seen at L5-S1. There severe degenerative changes at L1-L2. IMPRESSION: 1. No acute localizing process in the abdomen or pelvis. 2. Fatty infiltration of the liver. 3. Colonic diverticulosis. 4. Aortic atherosclerosis. Aortic Atherosclerosis (ICD10-I70.0). Electronically Signed   By: Greig Pique M.D.   On: 02/17/2024 17:06     Procedures   Medications Ordered in the ED  ondansetron  (ZOFRAN ) injection 4 mg (4 mg Intravenous Given 02/17/24 1237)  sodium chloride  0.9 % bolus 1,000 mL (0 mLs Intravenous Stopped 02/17/24 1704)  morphine  (PF) 4 MG/ML injection 4 mg (4 mg Intravenous Given 02/17/24 1339)  ondansetron  (ZOFRAN ) injection 4 mg (4 mg Intravenous Given 02/17/24 1339)  iohexol  (OMNIPAQUE ) 300 MG/ML solution 100 mL (100 mLs Intravenous Contrast Given 02/17/24 2565)    63 year old here for evaluation abdominal pain nausea and vomiting.  Recent EGD with stricture dilation with Dr. Wilhelmenia [facility].  Has had persistent abdominal pain and emesis since despite using Phenergan  suppositories.  No chest pain, shortness of breath, hemoptysis, bloody stool.  Labs and imaging personally viewed and interpreted:  CBC leukocytosis 11 Metabolic panel sodium 130 Lipase 15 UA w/ blood wo infection CT AP doubt acute abnormality EKG wo ischemic changes  Patient reassessed.  Pending CT scan.  Still having some nausea, dry heaving, will give additional doses Zofran .  Patient reassessed.  Tolerated p.o. challenge.  Feels improved.  Wants to go home.  Will DC home with ODT Zofran .  Encouraged her to follow-up with GI providers, return for any worsening symptoms.  Patient is nontoxic, nonseptic appearing, in no apparent distress.  Patient's pain and other symptoms adequately managed in emergency department.  Fluid bolus given.  Labs,  imaging and vitals reviewed.  Patient does not meet the SIRS or Sepsis criteria.  On repeat exam patient does not have a surgical abdomin and there are no peritoneal signs.  No indication of appendicitis, bowel obstruction, bowel perforation, cholecystitis, diverticulitis, PID, intermittent, persistent torsion, TOA, ectopic pregnancy, AAA, dissection, traumatic injury, boerhaave, esophageal injury, foreign object, allergic reaction, pneumomediastinum.  Patient discharged home with symptomatic treatment and  given strict instructions for follow-up with their primary care physician.  I have also discussed reasons to return immediately to the ER.  Patient expresses understanding and agrees with plan.                                     Medical Decision Making Amount and/or Complexity of Data Reviewed External Data Reviewed: labs, radiology, ECG and notes. Labs: ordered. Decision-making details documented in ED Course. Radiology: ordered and independent interpretation performed. Decision-making details documented in ED Course. ECG/medicine tests: ordered and independent interpretation performed. Decision-making details documented in ED Course.  Risk OTC drugs. Prescription drug management. Parenteral controlled substances. Decision regarding hospitalization. Diagnosis or treatment significantly limited by social determinants of health.        Final diagnoses:  Nausea and vomiting, unspecified vomiting type    ED Discharge Orders          Ordered    ondansetron  (ZOFRAN -ODT) 4 MG disintegrating tablet  Every 8 hours PRN        02/17/24 1824               Madaleine Simmon A, PA-C 02/17/24 1839    Armenta Canning, MD 02/18/24 0740

## 2024-02-17 NOTE — Telephone Encounter (Signed)
 The pt has continued nausea and vomiting since EGD on Monday and feels like her esophagus is closing. She has been directed to go to the ED for evaluation.  FYI

## 2024-02-20 NOTE — Telephone Encounter (Signed)
 Please followup with patient early this week. I reviewed the ER notes and negative CT scan. Thank you. GM

## 2024-02-21 NOTE — Telephone Encounter (Signed)
 Thank you  for update

## 2024-02-21 NOTE — Telephone Encounter (Signed)
 I spoke with the pt and she tells me that the vomiting has resolved and she is feeling better.  She will call if she does not continue to improve

## 2024-03-13 ENCOUNTER — Telehealth: Payer: Self-pay | Admitting: Gastroenterology

## 2024-03-13 NOTE — Telephone Encounter (Signed)
 Procedure:Endoscopy Procedure date: 03/20/24 Procedure location: [facility] Arrival Time: 11:45 am  Spoke with the patient Y/N: Yes Any prep concerns? No  Has the patient obtained the prep from the pharmacy ? No prep needed Do you have a care partner and transportation: Yes Any additional concerns? No

## 2024-03-14 ENCOUNTER — Encounter (HOSPITAL_COMMUNITY): Payer: Self-pay | Admitting: Gastroenterology

## 2024-03-14 NOTE — Progress Notes (Signed)
 Attempted to obtain medical history for pre op call via telephone, unable to reach at this time. HIPAA compliant voicemail message left requesting return call to pre surgical testing department.

## 2024-03-20 ENCOUNTER — Encounter (HOSPITAL_COMMUNITY): Payer: Self-pay | Admitting: Gastroenterology

## 2024-03-20 ENCOUNTER — Other Ambulatory Visit: Payer: Self-pay

## 2024-03-20 ENCOUNTER — Ambulatory Visit (HOSPITAL_COMMUNITY)
Admission: RE | Admit: 2024-03-20 | Discharge: 2024-03-20 | Disposition: A | Discharge status: Home / Self Care | Attending: Gastroenterology | Admitting: Gastroenterology

## 2024-03-20 ENCOUNTER — Ambulatory Visit (HOSPITAL_COMMUNITY): Admitting: Anesthesiology

## 2024-03-20 ENCOUNTER — Encounter (HOSPITAL_COMMUNITY)
Admission: RE | Disposition: A | Discharge status: Home / Self Care | Payer: Self-pay | Source: Home / Self Care | Attending: Gastroenterology

## 2024-03-20 DIAGNOSIS — E781 Pure hyperglyceridemia: Secondary | ICD-10-CM | POA: Diagnosis not present

## 2024-03-20 DIAGNOSIS — K449 Diaphragmatic hernia without obstruction or gangrene: Secondary | ICD-10-CM | POA: Diagnosis not present

## 2024-03-20 DIAGNOSIS — K2289 Other specified disease of esophagus: Secondary | ICD-10-CM | POA: Insufficient documentation

## 2024-03-20 DIAGNOSIS — K3189 Other diseases of stomach and duodenum: Secondary | ICD-10-CM | POA: Diagnosis not present

## 2024-03-20 DIAGNOSIS — K315 Obstruction of duodenum: Secondary | ICD-10-CM | POA: Diagnosis present

## 2024-03-20 HISTORY — PX: ESOPHAGOGASTRODUODENOSCOPY: SHX5428

## 2024-03-20 SURGERY — EGD (ESOPHAGOGASTRODUODENOSCOPY)
Anesthesia: Monitor Anesthesia Care

## 2024-03-20 MED ORDER — SODIUM CHLORIDE 0.9 % IV SOLN: INTRAVENOUS | Status: DC | PRN

## 2024-03-20 MED ORDER — PROPOFOL 500 MG/50ML IV EMUL
INTRAVENOUS | Status: DC | PRN
  Administered 2024-03-20: 125 ug/kg/min via INTRAVENOUS

## 2024-03-20 MED ORDER — SODIUM CHLORIDE 0.9 % IV SOLN: INTRAVENOUS | Status: DC

## 2024-03-20 MED ORDER — PROPOFOL 10 MG/ML IV BOLUS
INTRAVENOUS | Status: DC | PRN
  Administered 2024-03-20 (×2): 50 mg via INTRAVENOUS

## 2024-03-20 MED ORDER — SODIUM CHLORIDE 0.9 % IV SOLN
INTRAVENOUS | Status: AC | PRN
  Administered 2024-03-20: 500 mL via INTRAMUSCULAR

## 2024-03-20 NOTE — Discharge Instructions (Signed)

## 2024-03-20 NOTE — Transfer of Care (Signed)
 Immediate Anesthesia Transfer of Care Note  Patient: Patricia Hart  Procedure(s) Performed: EGD (ESOPHAGOGASTRODUODENOSCOPY)  Patient Location: PACU  Anesthesia Type:MAC  Level of Consciousness: awake and alert   Airway & Oxygen Therapy: Patient Spontanous Breathing and Patient connected to nasal cannula oxygen  Post-op Assessment: Report given to RN and Post -op Vital signs reviewed and stable  Post vital signs: Reviewed and stable  Last Vitals:  Vitals Value Taken Time  BP 129/84 03/20/24 13:17  Temp    Pulse 80 03/20/24 13:19  Resp 18 03/20/24 13:19  SpO2 94 % 03/20/24 13:19  Vitals shown include unfiled device data.  Last Pain:  Vitals:   03/20/24 1317  TempSrc:   PainSc: Asleep         Complications: No notable events documented.

## 2024-03-20 NOTE — Anesthesia Postprocedure Evaluation (Signed)
 Anesthesia Post Note  Patient: Patricia Hart  Procedure(s) Performed: EGD (ESOPHAGOGASTRODUODENOSCOPY)     Patient location during evaluation: PACU Anesthesia Type: MAC Level of consciousness: awake and alert Pain management: pain level controlled Vital Signs Assessment: post-procedure vital signs reviewed and stable Respiratory status: spontaneous breathing, nonlabored ventilation and respiratory function stable Cardiovascular status: blood pressure returned to baseline Postop Assessment: no apparent nausea or vomiting Anesthetic complications: no   No notable events documented.  Last Vitals:  Vitals:   03/20/24 1320 03/20/24 1330  BP: 135/83 123/69  Pulse: 80 79  Resp: 18 18  Temp:    SpO2: 94% 94%    Last Pain:  Vitals:   03/20/24 1330  TempSrc:   PainSc: 0-No pain                 Vertell Row

## 2024-03-20 NOTE — H&P (Signed)
 GASTROENTEROLOGY PROCEDURE H&P NOTE   Primary Care Physician: Catalina Bare, MD  HPI: Patricia Hart is a 63 y.o. female who presents for EGD for further dilation of known duodenal stricture/stenosis.  Past Medical History:  Diagnosis Date   Allergy    Arthritis    Blood transfusion without reported diagnosis 1997   Clotting disorder 1997   pregnancy induced dvt- no problems since then   Fibromyalgia    GERD (gastroesophageal reflux disease)    Hepatic steatosis 07/16/2021   History of DVT (deep vein thrombosis)    during pregnancy   History of pulmonary embolism    occurred during pregnancy   Overactive bladder    Past Surgical History:  Procedure Laterality Date   ABDOMINAL EXPOSURE N/A 02/19/2016   Procedure: ABDOMINAL EXPOSURE;  Surgeon: Krystal JULIANNA Doing, MD;  Location: [facility];  Service: Vascular;  Laterality: N/A;   ABLATION  2006   endometrial   ANTERIOR LUMBAR FUSION N/A 02/19/2016   Procedure: Anterior Lumbar Interbody Fusion  - Lumbar five-sacral one;  Surgeon: Alm GORMAN Molt, MD;  Location: [facility];  Service: Neurosurgery;  Laterality: N/A;   BACK SURGERY     BALLOON DILATION N/A 03/17/2021   Procedure: BALLOON DILATION;  Surgeon: Mansouraty, Aloha Raddle., MD;  Location: [facility];  Service: Gastroenterology;  Laterality: N/A;   BALLOON DILATION N/A 04/21/2021   Procedure: BALLOON DILATION;  Surgeon: Wilhelmenia Aloha Raddle., MD;  Location: [facility];  Service: Gastroenterology;  Laterality: N/A;   BIOPSY  03/17/2021   Procedure: BIOPSY;  Surgeon: Wilhelmenia Aloha Raddle., MD;  Location: [facility];  Service: Gastroenterology;;   breast implants     saline   CARPAL TUNNEL RELEASE Right 04/23/2015   Procedure: RIGHT CARPAL TUNNEL RELEASE;  Surgeon: Arley Curia, MD;  Location: [facility];  Service: Orthopedics;  Laterality: Right;   CARPAL TUNNEL RELEASE Left 05/16/2015   Procedure: LEFT CARPAL TUNNEL RELEASE;  Surgeon: Arley Curia,  MD;  Location: [facility];  Service: Orthopedics;  Laterality: Left;   COLONIC STENT PLACEMENT N/A 07/16/2021   Procedure: AXIOS STENT PLACEMENT;  Surgeon: Wilhelmenia Aloha Raddle., MD;  Location: [facility];  Service: Gastroenterology;  Laterality: N/A;   COLONOSCOPY     ESOPHAGOGASTRODUODENOSCOPY N/A 07/17/2021   Procedure: ESOPHAGOGASTRODUODENOSCOPY (EGD);  Surgeon: Teressa Toribio SQUIBB, MD;  Location: [facility];  Service: Gastroenterology;  Laterality: N/A;   ESOPHAGOGASTRODUODENOSCOPY N/A 02/14/2024   Procedure: EGD (ESOPHAGOGASTRODUODENOSCOPY);  Surgeon: Wilhelmenia Aloha Raddle., MD;  Location: [facility];  Service: Gastroenterology;  Laterality: N/A;   ESOPHAGOGASTRODUODENOSCOPY (EGD) WITH PROPOFOL  N/A 03/17/2021   Procedure: ESOPHAGOGASTRODUODENOSCOPY (EGD) WITH PROPOFOL ;  Surgeon: Wilhelmenia Aloha Raddle., MD;  Location: [facility];  Service: Gastroenterology;  Laterality: N/A;  Fluoro   ESOPHAGOGASTRODUODENOSCOPY (EGD) WITH PROPOFOL  N/A 04/21/2021   Procedure: ESOPHAGOGASTRODUODENOSCOPY (EGD) WITH PROPOFOL ;  Surgeon: Wilhelmenia Aloha Raddle., MD;  Location: [facility];  Service: Gastroenterology;  Laterality: N/A;  fluoro   ESOPHAGOGASTRODUODENOSCOPY (EGD) WITH PROPOFOL  N/A 05/16/2021   Procedure: ESOPHAGOGASTRODUODENOSCOPY (EGD) WITH PROPOFOL ;  Surgeon: Wilhelmenia Aloha Raddle., MD;  Location: [facility];  Service: Gastroenterology;  Laterality: N/A;   ESOPHAGOGASTRODUODENOSCOPY (EGD) WITH PROPOFOL  N/A 07/16/2021   Procedure: ESOPHAGOGASTRODUODENOSCOPY (EGD) WITH PROPOFOL ;  Surgeon: Wilhelmenia Aloha Raddle., MD;  Location: [facility];  Service: Gastroenterology;  Laterality: N/A;   FOREIGN BODY REMOVAL  07/16/2021   Procedure: FOREIGN BODY REMOVAL;  Surgeon: Wilhelmenia Aloha Raddle., MD;  Location: [facility];  Service: Gastroenterology;;   STENT REMOVAL  07/17/2021   Procedure:  STENT REMOVAL;  Surgeon: Teressa Toribio SQUIBB, MD;  Location: [facility];  Service:  Gastroenterology;;   TONSILLECTOMY     TRIGGER FINGER RELEASE Right 04/23/2015   Procedure: RELEASE TRIGGER FINGER/A-1 PULLEY RIGHT THUMB;  Surgeon: Arley Curia, MD;  Location: [facility];  Service: Orthopedics;  Laterality: Right;   UPPER GASTROINTESTINAL ENDOSCOPY     WISDOM TOOTH EXTRACTION     Current Facility-Administered Medications  Medication Dose Route Frequency Provider Last Rate Last Admin   0.9 %  sodium chloride  infusion   Intravenous Continuous Mansouraty, Aloha Raddle., MD        Current Facility-Administered Medications:    0.9 %  sodium chloride  infusion, , Intravenous, Continuous, Mansouraty, Aloha Raddle., MD Allergies  Allergen Reactions   Latex Other (See Comments)    Skin irritation   Family History  Problem Relation Age of Onset   Colon polyps Mother    Hyperlipidemia Mother    Colon cancer Mother        in her 41s or 59s   Osteoporosis Mother    Lung cancer Mother    Diabetes Father    Hyperlipidemia Father    Hypertension Father    Breast cancer Maternal Aunt        26's   Pancreatic cancer Neg Hx    Stomach cancer Neg Hx    Liver disease Neg Hx    Inflammatory bowel disease Neg Hx    Esophageal cancer Neg Hx    Rectal cancer Neg Hx    Social History   Socioeconomic History   Marital status: Divorced    Spouse name: Not on file   Number of children: Not on file   Years of education: Not on file   Highest education level: Not on file  Occupational History   Not on file  Tobacco Use   Smoking status: Never   Smokeless tobacco: Never  Vaping Use   Vaping status: Never Used  Substance and Sexual Activity   Alcohol use: Yes    Alcohol/week: 6.0 standard drinks of alcohol    Types: 4 Glasses of wine, 2 Standard drinks or equivalent per week    Comment: occ (currently none due to stomach issues 01/29/21)   Drug use: No   Sexual activity: Not Currently    Partners: Male    Birth control/protection: Surgical    Comment: Ablation   Other Topics Concern   Not on file  Social History Narrative   Not on file   Social Drivers of Health   Financial Resource Strain: Not on file  Food Insecurity: Not on file  Transportation Needs: Not on file  Physical Activity: Not on file  Stress: Not on file  Social Connections: Not on file  Intimate Partner Violence: Not on file    Physical Exam: There were no vitals filed for this visit. There is no height or weight on file to calculate BMI. GEN: NAD EYE: Sclerae anicteric ENT: MMM CV: Non-tachycardic GI: Soft, NT/ND NEURO:  Alert & Oriented x 3  Lab Results: No results for input(s): WBC, HGB, HCT, PLT in the last 72 hours. BMET No results for input(s): NA, K, CL, CO2, GLUCOSE, BUN, CREATININE, CALCIUM in the last 72 hours. LFT No results for input(s): PROT, ALBUMIN, AST, ALT, ALKPHOS, BILITOT, BILIDIR, IBILI in the last 72 hours. PT/INR No results for input(s): LABPROT, INR in the last 72 hours.   Impression / Plan: This is a 63 y.o.female who presents for EGD for  further dilation of known duodenal stricture/stenosis.  The risks and benefits of endoscopic evaluation/treatment were discussed with the patient and/or family; these include but are not limited to the risk of perforation, infection, bleeding, missed lesions, lack of diagnosis, severe illness requiring hospitalization, as well as anesthesia and sedation related illnesses.  The patient's history has been reviewed, patient examined, no change in status, and deemed stable for procedure.  The patient and/or family is agreeable to proceed.    Aloha Finner, MD [facility] Office # [phone]

## 2024-03-20 NOTE — Anesthesia Preprocedure Evaluation (Addendum)
 Anesthesia Evaluation  Patient identified by MRN, date of birth, ID band Patient awake    Reviewed: Allergy & Precautions, NPO status , Patient's Chart, lab work & pertinent test results  History of Anesthesia Complications Negative for: history of anesthetic complications  Airway Mallampati: II  TM Distance: >3 FB Neck ROM: Full    Dental no notable dental hx.    Pulmonary neg pulmonary ROS   Pulmonary exam normal        Cardiovascular negative cardio ROS Normal cardiovascular exam     Neuro/Psych negative neurological ROS     GI/Hepatic Neg liver ROS,GERD  ,,Duodenal stenosis   Endo/Other  negative endocrine ROS    Renal/GU negative Renal ROS     Musculoskeletal  (+) Arthritis ,  Fibromyalgia -  Abdominal   Peds  Hematology negative hematology ROS (+)   Anesthesia Other Findings   Reproductive/Obstetrics                              Anesthesia Physical Anesthesia Plan  ASA: 2  Anesthesia Plan: MAC   Post-op Pain Management: Minimal or no pain anticipated   Induction:   PONV Risk Score and Plan: Treatment may vary due to age or medical condition and Propofol  infusion  Airway Management Planned: Natural Airway and Nasal Cannula  Additional Equipment: None  Intra-op Plan:   Post-operative Plan:   Informed Consent: I have reviewed the patients History and Physical, chart, labs and discussed the procedure including the risks, benefits and alternatives for the proposed anesthesia with the patient or authorized representative who has indicated his/her understanding and acceptance.       Plan Discussed with: CRNA  Anesthesia Plan Comments:          Anesthesia Quick Evaluation

## 2024-03-20 NOTE — Op Note (Signed)
 [facility] Patient Name: Patricia Hart Procedure Date: 03/20/2024 MRN: [id number] Attending MD: Aloha Finner , MD, [id number] Date of Birth: 1961-04-11 CSN: [id number] Age: 63 Admit Type: Outpatient Procedure:                Upper GI endoscopy Indications:              Stenosis of the duodenum, Follow-up of duodenal                            stenosis, For therapy of duodenal stenosis Providers:                Aloha Finner, MD, Randall Lines, RN, Jasmine                            Petiford, Technician, Adell Hartshorn, CRNA Referring MD:              Medicines:                Monitored Anesthesia Care Complications:            No immediate complications. Estimated Blood Loss:     Estimated blood loss was minimal. Procedure:                Pre-Anesthesia Assessment:                           - Prior to the procedure, a History and Physical                            was performed, and patient medications and                            allergies were reviewed. The patient's tolerance of                            previous anesthesia was also reviewed. The risks                            and benefits of the procedure and the sedation                            options and risks were discussed with the patient.                            All questions were answered, and informed consent                            was obtained. Prior Anticoagulants: The patient has                            taken no anticoagulant or antiplatelet agents. ASA                            Grade Assessment: II - A patient with mild systemic  disease. After reviewing the risks and benefits,                            the patient was deemed in satisfactory condition to                            undergo the procedure.                           After obtaining informed consent, the endoscope was                            passed under direct vision.  Throughout the                            procedure, the patient's blood pressure, pulse, and                            oxygen saturations were monitored continuously. The                            [id number] ([id number]) Olympus endoscope was introduced                            through the mouth, and advanced to the second part                            of duodenum. The upper GI endoscopy was                            accomplished without difficulty. The patient                            tolerated the procedure. Scope In: Scope Out: Findings:      No gross lesions were noted in the entire esophagus.      The Z-line was irregular and was found 38 cm from the incisors.      A 1 cm hiatal hernia was present.      Patchy mildly erythematous mucosa without bleeding was found in the       entire examined stomach.      An acquired benign-appearing, intrinsic moderate stenosis was found in       the duodenal sweep and was traversed with gentle pressure. A TTS dilator       was passed through the scope. Dilation with a 03-05-11 mm and a       12-13.5-15 mm pyloric balloon dilator was performed. The dilation site       was examined following endoscope reinsertion and showed moderate mucosal       disruption, moderate improvement in luminal narrowing and no perforation.      No gross lesions were noted in the second portion of the duodenum. Impression:               - No gross lesions in the entire esophagus. Z-line  irregular, 38 cm from the incisors.                           - 1 cm hiatal hernia.                           - Erythematous mucosa in the stomach.                           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                            Dilated to 15 mm with mucosal wrent.                           - No gross lesions in the second portion of the                            duodenum. Moderate Sedation:      Not Applicable - Patient had care  per Anesthesia. Recommendation:           - The patient will be observed post-procedure,                            until all discharge criteria are met.                           - Discharge patient to home.                           - Patient has a contact number available for                            emergencies. The signs and symptoms of potential                            delayed complications were discussed with the                            patient. Return to normal activities tomorrow.                            Written discharge instructions were provided to the                            patient.                           - Full liquid diet.                           - Observe patient's clinical course.                           - Monitor for signs/symptoms of bleeding,  perforation, and infection. If issues please call                            our number to get further assistance as needed.                           - Repeat upper endoscopy in 4 weeks for retreatment.                           - Continue PPI BID                           - Continue Carafate  4 times daily.                           - Continue present medications.                           - The findings and recommendations were discussed                            with the patient.                           - The findings and recommendations were discussed                            with the patient's family. Procedure Code(s):        --- Professional ---                           [phone], Esophagogastroduodenoscopy, flexible,                            transoral; with dilation of gastric/duodenal                            stricture(s) (eg, balloon, bougie) Diagnosis Code(s):        --- Professional ---                           K22.89, Other specified disease of esophagus                           K44.9, Diaphragmatic hernia without obstruction or                             gangrene                           K31.89, Other diseases of stomach and duodenum                           K31.5, Obstruction of duodenum CPT copyright 2022 [employer]. All rights reserved. The codes documented in this report are preliminary and upon coder review may  be revised to meet current compliance requirements. [employer],  MD 03/20/2024 1:35:33 PM Number of Addenda: 0

## 2024-03-21 ENCOUNTER — Telehealth: Payer: Self-pay

## 2024-03-21 NOTE — Telephone Encounter (Signed)
-----   Message from [facility] sent at 03/20/2024  2:06 PM EDT ----- Regarding: Repeat EGD [facility] [facility], Please schedule repeat EGD in [facility] or Hospital in 3-5 weeks. Duodenal stenosis. Thanks. GM

## 2024-03-22 ENCOUNTER — Encounter (HOSPITAL_COMMUNITY): Payer: Self-pay | Admitting: Gastroenterology

## 2024-03-22 NOTE — Telephone Encounter (Signed)
 Previsit set up for 04/18/24 at 830 am and EGD set up for 12/2 at 9 am.     The pt has been advised via My Chart- she does view her messages

## 2024-04-18 ENCOUNTER — Ambulatory Visit (AMBULATORY_SURGERY_CENTER)

## 2024-04-18 VITALS — Ht 61.5 in | Wt 156.0 lb

## 2024-04-18 DIAGNOSIS — K315 Obstruction of duodenum: Secondary | ICD-10-CM

## 2024-04-18 NOTE — Progress Notes (Signed)

## 2024-04-25 ENCOUNTER — Ambulatory Visit: Admitting: Gastroenterology

## 2024-04-25 ENCOUNTER — Encounter: Payer: Self-pay | Admitting: Gastroenterology

## 2024-04-25 VITALS — BP 105/67 | HR 78 | Temp 97.9°F | Resp 20 | Ht 61.5 in | Wt 156.0 lb

## 2024-04-25 DIAGNOSIS — K315 Obstruction of duodenum: Secondary | ICD-10-CM | POA: Diagnosis not present

## 2024-04-25 DIAGNOSIS — K3189 Other diseases of stomach and duodenum: Secondary | ICD-10-CM

## 2024-04-25 DIAGNOSIS — K449 Diaphragmatic hernia without obstruction or gangrene: Secondary | ICD-10-CM | POA: Diagnosis not present

## 2024-04-25 MED ORDER — SODIUM CHLORIDE 0.9 % IV SOLN: 500.0000 mL | Freq: Once | INTRAVENOUS | Status: DC

## 2024-04-25 NOTE — Progress Notes (Signed)
 Called to room to assist during endoscopic procedure.  Patient ID and intended procedure confirmed with present staff. Received instructions for my participation in the procedure from the performing physician.

## 2024-04-25 NOTE — Progress Notes (Signed)
 GASTROENTEROLOGY PROCEDURE H&P NOTE   Primary Care Physician: Catalina Bare, MD  HPI: Patricia Hart is a 63 y.o. female who presents for EGD for duodenal stricture/stenosis dilation.  Past Medical History:  Diagnosis Date   Allergy    Arthritis    Blood transfusion without reported diagnosis 1997   Clotting disorder 1997   pregnancy induced dvt- no problems since then   Fibromyalgia    GERD (gastroesophageal reflux disease)    Hepatic steatosis 07/16/2021   History of DVT (deep vein thrombosis)    during pregnancy   History of pulmonary embolism    occurred during pregnancy   Overactive bladder    Past Surgical History:  Procedure Laterality Date   ABDOMINAL EXPOSURE N/A 02/19/2016   Procedure: ABDOMINAL EXPOSURE;  Surgeon: Krystal JULIANNA Doing, MD;  Location: [facility];  Service: Vascular;  Laterality: N/A;   ABLATION  2006   endometrial   ANTERIOR LUMBAR FUSION N/A 02/19/2016   Procedure: Anterior Lumbar Interbody Fusion  - Lumbar five-sacral one;  Surgeon: Alm GORMAN Molt, MD;  Location: [facility];  Service: Neurosurgery;  Laterality: N/A;   BACK SURGERY     BALLOON DILATION N/A 03/17/2021   Procedure: BALLOON DILATION;  Surgeon: Mansouraty, Aloha Raddle., MD;  Location: [facility];  Service: Gastroenterology;  Laterality: N/A;   BALLOON DILATION N/A 04/21/2021   Procedure: BALLOON DILATION;  Surgeon: Wilhelmenia Aloha Raddle., MD;  Location: [facility];  Service: Gastroenterology;  Laterality: N/A;   BIOPSY  03/17/2021   Procedure: BIOPSY;  Surgeon: Wilhelmenia Aloha Raddle., MD;  Location: [facility];  Service: Gastroenterology;;   breast implants     saline   CARPAL TUNNEL RELEASE Right 04/23/2015   Procedure: RIGHT CARPAL TUNNEL RELEASE;  Surgeon: Arley Curia, MD;  Location: [facility];  Service: Orthopedics;  Laterality: Right;   CARPAL TUNNEL RELEASE Left 05/16/2015   Procedure: LEFT CARPAL TUNNEL RELEASE;  Surgeon: Arley Curia, MD;  Location:  [facility];  Service: Orthopedics;  Laterality: Left;   COLONIC STENT PLACEMENT N/A 07/16/2021   Procedure: AXIOS STENT PLACEMENT;  Surgeon: Wilhelmenia Aloha Raddle., MD;  Location: [facility];  Service: Gastroenterology;  Laterality: N/A;   COLONOSCOPY     ESOPHAGOGASTRODUODENOSCOPY N/A 07/17/2021   Procedure: ESOPHAGOGASTRODUODENOSCOPY (EGD);  Surgeon: Teressa Toribio SQUIBB, MD;  Location: [facility];  Service: Gastroenterology;  Laterality: N/A;   ESOPHAGOGASTRODUODENOSCOPY N/A 02/14/2024   Procedure: EGD (ESOPHAGOGASTRODUODENOSCOPY);  Surgeon: Wilhelmenia Aloha Raddle., MD;  Location: [facility];  Service: Gastroenterology;  Laterality: N/A;   ESOPHAGOGASTRODUODENOSCOPY N/A 03/20/2024   Procedure: EGD (ESOPHAGOGASTRODUODENOSCOPY);  Surgeon: Wilhelmenia Aloha Raddle., MD;  Location: [facility];  Service: Gastroenterology;  Laterality: N/A;   ESOPHAGOGASTRODUODENOSCOPY (EGD) WITH PROPOFOL  N/A 03/17/2021   Procedure: ESOPHAGOGASTRODUODENOSCOPY (EGD) WITH PROPOFOL ;  Surgeon: Wilhelmenia Aloha Raddle., MD;  Location: [facility];  Service: Gastroenterology;  Laterality: N/A;  Fluoro   ESOPHAGOGASTRODUODENOSCOPY (EGD) WITH PROPOFOL  N/A 04/21/2021   Procedure: ESOPHAGOGASTRODUODENOSCOPY (EGD) WITH PROPOFOL ;  Surgeon: Wilhelmenia Aloha Raddle., MD;  Location: [facility];  Service: Gastroenterology;  Laterality: N/A;  fluoro   ESOPHAGOGASTRODUODENOSCOPY (EGD) WITH PROPOFOL  N/A 05/16/2021   Procedure: ESOPHAGOGASTRODUODENOSCOPY (EGD) WITH PROPOFOL ;  Surgeon: Wilhelmenia Aloha Raddle., MD;  Location: [facility];  Service: Gastroenterology;  Laterality: N/A;   ESOPHAGOGASTRODUODENOSCOPY (EGD) WITH PROPOFOL  N/A 07/16/2021   Procedure: ESOPHAGOGASTRODUODENOSCOPY (EGD) WITH PROPOFOL ;  Surgeon: Wilhelmenia Aloha Raddle., MD;  Location: [facility];  Service: Gastroenterology;  Laterality: N/A;   FOREIGN BODY REMOVAL  07/16/2021   Procedure: FOREIGN BODY  REMOVAL;  Surgeon: Wilhelmenia Aloha Raddle.,  MD;  Location: [facility];  Service: Gastroenterology;;   CLEDA REMOVAL  07/17/2021   Procedure: STENT REMOVAL;  Surgeon: Teressa Toribio SQUIBB, MD;  Location: [facility];  Service: Gastroenterology;;   TONSILLECTOMY     TRIGGER FINGER RELEASE Right 04/23/2015   Procedure: RELEASE TRIGGER FINGER/A-1 PULLEY RIGHT THUMB;  Surgeon: Arley Curia, MD;  Location: [facility];  Service: Orthopedics;  Laterality: Right;   UPPER GASTROINTESTINAL ENDOSCOPY     WISDOM TOOTH EXTRACTION     Current Outpatient Medications  Medication Sig Dispense Refill   acetaminophen  (TYLENOL ) 500 MG tablet Take 1,500 mg by mouth every 6 (six) hours as needed for mild pain.     aspirin 325 MG tablet Take 325 mg by mouth daily.     b complex vitamins capsule Take 1 capsule by mouth daily.     diclofenac Sodium (VOLTAREN) 1 % GEL Apply 1 application topically daily.     guaiFENesin (MUCINEX) 600 MG 12 hr tablet Take 600 mg by mouth daily as needed (Allergies).     metoCLOPramide  (REGLAN ) 10 MG tablet Take 1 tablet (10 mg total) by mouth 4 (four) times daily for 8 days. 30 tablet 0   montelukast (SINGULAIR) 10 MG tablet Take 10 mg by mouth at bedtime.     Multiple Vitamins-Minerals (MULTIVITAMIN PO) Take 1 tablet by mouth daily.     ondansetron  (ZOFRAN ) 4 MG tablet Take 1 tablet (4 mg total) by mouth every 4 (four) hours as needed for nausea. 20 tablet 0   ondansetron  (ZOFRAN -ODT) 4 MG disintegrating tablet Take 1 tablet (4 mg total) by mouth every 8 (eight) hours as needed. 20 tablet 0   prochlorperazine  (COMPAZINE ) 25 MG suppository Place 1 suppository (25 mg total) rectally every 6 (six) hours as needed for nausea or vomiting. If oral anti-nausea medication is not helping 12 suppository 0   promethazine  (PHENERGAN ) 12.5 MG tablet TAKE 1 TABLET(12.5 MG) BY MOUTH EVERY 6 HOURS AS NEEDED FOR NAUSEA OR VOMITING 30 tablet 2   RABEprazole  (ACIPHEX ) 20 MG tablet Take 1 tablet (20 mg total) by mouth 2 (two) times  daily. 180 tablet 4   sucralfate  (CARAFATE ) 1 g tablet Take 1 tablet (1 g total) by mouth 4 (four) times daily -  with meals and at bedtime. 120 tablet 11   Current Facility-Administered Medications  Medication Dose Route Frequency Provider Last Rate Last Admin   0.9 %  sodium chloride  infusion  500 mL Intravenous Once Mansouraty, Elimelech Houseman Jr., MD        Current Outpatient Medications:    acetaminophen  (TYLENOL ) 500 MG tablet, Take 1,500 mg by mouth every 6 (six) hours as needed for mild pain., Disp: , Rfl:    aspirin 325 MG tablet, Take 325 mg by mouth daily., Disp: , Rfl:    b complex vitamins capsule, Take 1 capsule by mouth daily., Disp: , Rfl:    diclofenac Sodium (VOLTAREN) 1 % GEL, Apply 1 application topically daily., Disp: , Rfl:    guaiFENesin (MUCINEX) 600 MG 12 hr tablet, Take 600 mg by mouth daily as needed (Allergies)., Disp: , Rfl:    metoCLOPramide  (REGLAN ) 10 MG tablet, Take 1 tablet (10 mg total) by mouth 4 (four) times daily for 8 days., Disp: 30 tablet, Rfl: 0   montelukast (SINGULAIR) 10 MG tablet, Take 10 mg by mouth at bedtime., Disp: , Rfl:    Multiple Vitamins-Minerals (MULTIVITAMIN PO), Take 1 tablet by mouth daily.,  Disp: , Rfl:    ondansetron  (ZOFRAN ) 4 MG tablet, Take 1 tablet (4 mg total) by mouth every 4 (four) hours as needed for nausea., Disp: 20 tablet, Rfl: 0   ondansetron  (ZOFRAN -ODT) 4 MG disintegrating tablet, Take 1 tablet (4 mg total) by mouth every 8 (eight) hours as needed., Disp: 20 tablet, Rfl: 0   prochlorperazine  (COMPAZINE ) 25 MG suppository, Place 1 suppository (25 mg total) rectally every 6 (six) hours as needed for nausea or vomiting. If oral anti-nausea medication is not helping, Disp: 12 suppository, Rfl: 0   promethazine  (PHENERGAN ) 12.5 MG tablet, TAKE 1 TABLET(12.5 MG) BY MOUTH EVERY 6 HOURS AS NEEDED FOR NAUSEA OR VOMITING, Disp: 30 tablet, Rfl: 2   RABEprazole  (ACIPHEX ) 20 MG tablet, Take 1 tablet (20 mg total) by mouth 2 (two) times  daily., Disp: 180 tablet, Rfl: 4   sucralfate  (CARAFATE ) 1 g tablet, Take 1 tablet (1 g total) by mouth 4 (four) times daily -  with meals and at bedtime., Disp: 120 tablet, Rfl: 11  Current Facility-Administered Medications:    0.9 %  sodium chloride  infusion, 500 mL, Intravenous, Once, Mansouraty, Aloha Raddle., MD Allergies  Allergen Reactions   Latex Other (See Comments)    Skin irritation   Family History  Problem Relation Age of Onset   Colon polyps Mother    Hyperlipidemia Mother    Colon cancer Mother        in her 43s or 59s   Osteoporosis Mother    Lung cancer Mother    Diabetes Father    Hyperlipidemia Father    Hypertension Father    Breast cancer Maternal Aunt        64's   Pancreatic cancer Neg Hx    Stomach cancer Neg Hx    Liver disease Neg Hx    Inflammatory bowel disease Neg Hx    Esophageal cancer Neg Hx    Rectal cancer Neg Hx    Social History   Socioeconomic History   Marital status: Divorced    Spouse name: Not on file   Number of children: Not on file   Years of education: Not on file   Highest education level: Not on file  Occupational History   Not on file  Tobacco Use   Smoking status: Never   Smokeless tobacco: Never  Vaping Use   Vaping status: Never Used  Substance and Sexual Activity   Alcohol use: Yes    Alcohol/week: 6.0 standard drinks of alcohol    Types: 4 Glasses of wine, 2 Standard drinks or equivalent per week    Comment: rare (currently none due to stomach issues 01/29/21)   Drug use: No   Sexual activity: Not Currently    Partners: Male    Birth control/protection: Surgical    Comment: Ablation  Other Topics Concern   Not on file  Social History Narrative   Not on file   Social Drivers of Health   Financial Resource Strain: Not on file  Food Insecurity: Not on file  Transportation Needs: Not on file  Physical Activity: Not on file  Stress: Not on file  Social Connections: Not on file  Intimate Partner Violence:  Not on file    Physical Exam: Today's Vitals   04/25/24 0900  BP: 128/63  Pulse: 87  Temp: 97.9 F (36.6 C)  TempSrc: Temporal  SpO2: 97%  Weight: 156 lb (70.8 kg)  Height: 5' 1.5 (1.562 m)   Body mass index is 29  kg/m. GEN: NAD EYE: Sclerae anicteric ENT: MMM CV: Non-tachycardic GI: Soft, NT/ND NEURO:  Alert & Oriented x 3  Lab Results: No results for input(s): WBC, HGB, HCT, PLT in the last 72 hours. BMET No results for input(s): NA, K, CL, CO2, GLUCOSE, BUN, CREATININE, CALCIUM in the last 72 hours. LFT No results for input(s): PROT, ALBUMIN, AST, ALT, ALKPHOS, BILITOT, BILIDIR, IBILI in the last 72 hours. PT/INR No results for input(s): LABPROT, INR in the last 72 hours.   Impression / Plan: This is a 63 y.o.female who presents for EGD for duodenal stricture/stenosis dilation.  The risks and benefits of endoscopic evaluation/treatment were discussed with the patient and/or family; these include but are not limited to the risk of perforation, infection, bleeding, missed lesions, lack of diagnosis, severe illness requiring hospitalization, as well as anesthesia and sedation related illnesses.  The patient's history has been reviewed, patient examined, no change in status, and deemed stable for procedure.  The patient and/or family was provided an opportunity to ask questions and all were answered.  The patient and/or family is agreeable to proceed.    Aloha Finner, MD [facility]oscopy Office # [phone]

## 2024-04-25 NOTE — Op Note (Signed)
 [facility] Patient Name: Patricia Hart Procedure Date: 04/25/2024 9:37 AM MRN: [id number] Endoscopist: Aloha Finner , MD, [id number] Age: 63 Referring MD:  Date of Birth: January 10, 1961 Gender: Female Account #: [id number] Procedure:                Upper GI endoscopy Indications:              Stenosis of the duodenum, For therapy of duodenal                            stenosis, Nausea with vomiting Medicines:                Monitored Anesthesia Care Procedure:                Pre-Anesthesia Assessment:                           - Prior to the procedure, a History and Physical                            was performed, and patient medications and                            allergies were reviewed. The patient's tolerance of                            previous anesthesia was also reviewed. The risks                            and benefits of the procedure and the sedation                            options and risks were discussed with the patient.                            All questions were answered, and informed consent                            was obtained. Prior Anticoagulants: The patient has                            taken no anticoagulant or antiplatelet agents. ASA                            Grade Assessment: III - A patient with severe                            systemic disease. After reviewing the risks and                            benefits, the patient was deemed in satisfactory                            condition to undergo the procedure.  After obtaining informed consent, the endoscope was                            passed under direct vision. Throughout the                            procedure, the patient's blood pressure, pulse, and                            oxygen saturations were monitored continuously. The                            Olympus Scope SN [id number] was introduced through the                            mouth, and  advanced to the second part of duodenum.                            The upper GI endoscopy was accomplished without                            difficulty. The patient tolerated the procedure. Scope In: Scope Out: Findings:                 No gross lesions were noted in the entire esophagus.                           The Z-line was irregular and was found 36 cm from                            the incisors.                           A 1 cm hiatal hernia was present.                           Patchy mildly erythematous mucosa without bleeding                            was found in the entire examined stomach. Not                            rebiopsied (previously negative for HP).                           Normal mucosa was found in the duodenal bulb.                           An acquired benign-appearing, intrinsic moderate                            stenosis was found in the duodenal sweep and was  traversed with gentle pressure. A TTS dilator was                            passed through the scope. Dilation with a                            12-13.5-15 mm pyloric balloon dilator was performed                            up to 15 mm. The dilation site was examined                            following endoscope reinsertion and showed moderate                            mucosal disruption, moderate improvement in luminal                            narrowing and no perforation.                           No gross lesions were noted in the second portion                            of the duodenum. Complications:            No immediate complications. Estimated Blood Loss:     Estimated blood loss was minimal. Impression:               - No gross lesions in the entire esophagus. Z-line                            irregular, 36 cm from the incisors.                           - 1 cm hiatal hernia.                           - Erythematous mucosa in the stomach.                            - Normal mucosa was found in the duodenal bulb.                           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 Dilated                            to 15 mm.                           - No gross lesions in the second portion of the                            duodenum. Recommendation:           - The patient will  be observed post-procedure,                            until all discharge criteria are met.                           - Discharge patient to home.                           - Patient has a contact number available for                            emergencies. The signs and symptoms of potential                            delayed complications were discussed with the                            patient. Return to normal activities tomorrow.                            Written discharge instructions were provided to the                            patient.                           - Advance diet as tolerated.                           - Continue present medications.                           - Repeat upper endoscopy PRN for retreatment based                            on patient symptoms or try to get further dilation                            on books for 4-8 weeks.                           - The findings and recommendations were discussed                            with the patient. Aloha Finner, MD 04/25/2024 10:14:39 AM

## 2024-04-25 NOTE — Patient Instructions (Addendum)
-   Advance diet as tolerated.  - Continue present medications.  - Repeat upper endoscopy PRN for retreatment based     on patient symptoms or try to get further dilation     on books for 4-8 weeks.  YOU HAD AN ENDOSCOPIC PROCEDURE TODAY AT THE  ENDOSCOPY CENTER:   Refer to the procedure report that was given to you for any specific questions about what was found during the examination.  If the procedure report does not answer your questions, please call your gastroenterologist to clarify.  If you requested that your care partner not be given the details of your procedure findings, then the procedure report has been included in a sealed envelope for you to review at your convenience later.  YOU SHOULD EXPECT: Some feelings of bloating in the abdomen. Passage of more gas than usual.  Walking can help get rid of the air that was put into your GI tract during the procedure and reduce the bloating. If you had a lower endoscopy (such as a colonoscopy or flexible sigmoidoscopy) you may notice spotting of blood in your stool or on the toilet paper. If you underwent a bowel prep for your procedure, you may not have a normal bowel movement for a few days.  Please Note:  You might notice some irritation and congestion in your nose or some drainage.  This is from the oxygen used during your procedure.  There is no need for concern and it should clear up in a day or so.  SYMPTOMS TO REPORT IMMEDIATELY:   Following upper endoscopy (EGD)  Vomiting of blood or coffee ground material  New chest pain or pain under the shoulder blades  Painful or persistently difficult swallowing  New shortness of breath  Fever of 100F or higher  Black, tarry-looking stools  For urgent or emergent issues, a gastroenterologist can be reached at any hour by calling [phone]. Do not use MyChart messaging for urgent concerns.    DIET:  We do recommend a small meal at first, but then you may proceed to your regular  diet.  Drink plenty of fluids but you should avoid alcoholic beverages for 24 hours.  ACTIVITY:  You should plan to take it easy for the rest of today and you should NOT DRIVE or use heavy machinery until tomorrow (because of the sedation medicines used during the test).    FOLLOW UP: Our staff will call the number listed on your records the next business day following your procedure.  We will call around 7:15- 8:00 am to check on you and address any questions or concerns that you may have regarding the information given to you following your procedure. If we do not reach you, we will leave a message.     If any biopsies were taken you will be contacted by phone or by letter within the next 1-3 weeks.  Please call us  at [phone] if you have not heard about the biopsies in 3 weeks.    SIGNATURES/CONFIDENTIALITY: You and/or your care partner have signed paperwork which will be entered into your electronic medical record.  These signatures attest to the fact that that the information above on your After Visit Summary has been reviewed and is understood.  Full responsibility of the confidentiality of this discharge information lies with you and/or your care-partner.

## 2024-04-25 NOTE — Progress Notes (Signed)
 To pacu, VSS. Report to [email or web address]

## 2024-04-25 NOTE — Progress Notes (Signed)
 Pt's states no medical or surgical changes since previsit or office visit.

## 2024-04-26 ENCOUNTER — Telehealth: Payer: Self-pay

## 2024-04-26 NOTE — Telephone Encounter (Signed)
 No answer after follow up call. Voice message left.

## 2024-05-10 ENCOUNTER — Encounter: Payer: Self-pay | Admitting: Gastroenterology

## 2024-05-10 ENCOUNTER — Ambulatory Visit: Admitting: Gastroenterology

## 2024-05-10 VITALS — BP 118/79 | HR 86 | Temp 97.5°F | Resp 12 | Ht 61.0 in | Wt 156.0 lb

## 2024-05-10 DIAGNOSIS — K315 Obstruction of duodenum: Secondary | ICD-10-CM | POA: Diagnosis not present

## 2024-05-10 DIAGNOSIS — R1114 Bilious vomiting: Secondary | ICD-10-CM

## 2024-05-10 DIAGNOSIS — K449 Diaphragmatic hernia without obstruction or gangrene: Secondary | ICD-10-CM | POA: Diagnosis not present

## 2024-05-10 DIAGNOSIS — R634 Abnormal weight loss: Secondary | ICD-10-CM

## 2024-05-10 MED ORDER — SODIUM CHLORIDE 0.9 % IV SOLN: 500.0000 mL | Freq: Once | INTRAVENOUS | Status: DC

## 2024-05-10 NOTE — Progress Notes (Signed)
 Report given to PACU, vss

## 2024-05-10 NOTE — Progress Notes (Signed)
 Scheduled follow up appt. for upper endoscopy for 4-6 weeks per MD verbal order. Instructions provided for procedure.

## 2024-05-10 NOTE — Progress Notes (Signed)
 GASTROENTEROLOGY PROCEDURE H&P NOTE   Primary Care Physician: Catalina Bare, MD  HPI: Patricia Hart is a 63 y.o. 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  Past Medical History:  Diagnosis Date   Allergy    Arthritis    Blood transfusion without reported diagnosis 1997   Clotting disorder 1997   pregnancy induced dvt- no problems since then   Fibromyalgia    GERD (gastroesophageal reflux disease)    Hepatic steatosis 07/16/2021   History of DVT (deep vein thrombosis)    during pregnancy   History of pulmonary embolism    occurred during pregnancy   Overactive bladder    Past Surgical History:  Procedure Laterality Date   ABDOMINAL EXPOSURE N/A 02/19/2016   Procedure: ABDOMINAL EXPOSURE;  Surgeon: Krystal JULIANNA Doing, MD;  Location: [facility];  Service: Vascular;  Laterality: N/A;   ABLATION  2006   endometrial   ANTERIOR LUMBAR FUSION N/A 02/19/2016   Procedure: Anterior Lumbar Interbody Fusion  - Lumbar five-sacral one;  Surgeon: Alm GORMAN Molt, MD;  Location: [facility];  Service: Neurosurgery;  Laterality: N/A;   BACK SURGERY     BALLOON DILATION N/A 03/17/2021   Procedure: BALLOON DILATION;  Surgeon: Mansouraty, Aloha Raddle., MD;  Location: [facility];  Service: Gastroenterology;  Laterality: N/A;   BALLOON DILATION N/A 04/21/2021   Procedure: BALLOON DILATION;  Surgeon: Wilhelmenia Aloha Raddle., MD;  Location: [facility];  Service: Gastroenterology;  Laterality: N/A;   BIOPSY  03/17/2021   Procedure: BIOPSY;  Surgeon: Wilhelmenia Aloha Raddle., MD;  Location: [facility];  Service: Gastroenterology;;   breast implants     saline   CARPAL TUNNEL RELEASE Right 04/23/2015   Procedure: RIGHT CARPAL TUNNEL RELEASE;  Surgeon: Arley Curia, MD;  Location: [facility];  Service: Orthopedics;  Laterality: Right;   CARPAL TUNNEL RELEASE Left 05/16/2015   Procedure: LEFT CARPAL TUNNEL RELEASE;  Surgeon: Arley Curia,  MD;  Location: [facility];  Service: Orthopedics;  Laterality: Left;   COLONIC STENT PLACEMENT N/A 07/16/2021   Procedure: AXIOS STENT PLACEMENT;  Surgeon: Wilhelmenia Aloha Raddle., MD;  Location: [facility];  Service: Gastroenterology;  Laterality: N/A;   COLONOSCOPY     ESOPHAGOGASTRODUODENOSCOPY N/A 07/17/2021   Procedure: ESOPHAGOGASTRODUODENOSCOPY (EGD);  Surgeon: Teressa Toribio SQUIBB, MD;  Location: [facility];  Service: Gastroenterology;  Laterality: N/A;   ESOPHAGOGASTRODUODENOSCOPY N/A 02/14/2024   Procedure: EGD (ESOPHAGOGASTRODUODENOSCOPY);  Surgeon: Wilhelmenia Aloha Raddle., MD;  Location: [facility];  Service: Gastroenterology;  Laterality: N/A;   ESOPHAGOGASTRODUODENOSCOPY N/A 03/20/2024   Procedure: EGD (ESOPHAGOGASTRODUODENOSCOPY);  Surgeon: Wilhelmenia Aloha Raddle., MD;  Location: [facility];  Service: Gastroenterology;  Laterality: N/A;   ESOPHAGOGASTRODUODENOSCOPY (EGD) WITH PROPOFOL  N/A 03/17/2021   Procedure: ESOPHAGOGASTRODUODENOSCOPY (EGD) WITH PROPOFOL ;  Surgeon: Wilhelmenia Aloha Raddle., MD;  Location: [facility];  Service: Gastroenterology;  Laterality: N/A;  Fluoro   ESOPHAGOGASTRODUODENOSCOPY (EGD) WITH PROPOFOL  N/A 04/21/2021   Procedure: ESOPHAGOGASTRODUODENOSCOPY (EGD) WITH PROPOFOL ;  Surgeon: Wilhelmenia Aloha Raddle., MD;  Location: [facility];  Service: Gastroenterology;  Laterality: N/A;  fluoro   ESOPHAGOGASTRODUODENOSCOPY (EGD) WITH PROPOFOL  N/A 05/16/2021   Procedure: ESOPHAGOGASTRODUODENOSCOPY (EGD) WITH PROPOFOL ;  Surgeon: Wilhelmenia Aloha Raddle., MD;  Location: [facility];  Service: Gastroenterology;  Laterality: N/A;   ESOPHAGOGASTRODUODENOSCOPY (EGD) WITH PROPOFOL  N/A 07/16/2021   Procedure: ESOPHAGOGASTRODUODENOSCOPY (EGD) WITH PROPOFOL ;  Surgeon: Wilhelmenia Aloha Raddle., MD;  Location: [facility];  Service: Gastroenterology;  Laterality: N/A;   FOREIGN BODY REMOVAL  07/16/2021  Procedure: FOREIGN BODY REMOVAL;  Surgeon: Wilhelmenia  Aloha Raddle., MD;  Location: [facility];  Service: Gastroenterology;;   CLEDA REMOVAL  07/17/2021   Procedure: STENT REMOVAL;  Surgeon: Teressa Toribio SQUIBB, MD;  Location: [facility];  Service: Gastroenterology;;   TONSILLECTOMY     TRIGGER FINGER RELEASE Right 04/23/2015   Procedure: RELEASE TRIGGER FINGER/A-1 PULLEY RIGHT THUMB;  Surgeon: Arley Curia, MD;  Location: [facility];  Service: Orthopedics;  Laterality: Right;   UPPER GASTROINTESTINAL ENDOSCOPY     WISDOM TOOTH EXTRACTION     Current Outpatient Medications  Medication Sig Dispense Refill   acetaminophen  (TYLENOL ) 500 MG tablet Take 1,500 mg by mouth every 6 (six) hours as needed for mild pain.     aspirin 325 MG tablet Take 325 mg by mouth daily.     b complex vitamins capsule Take 1 capsule by mouth daily.     diclofenac Sodium (VOLTAREN) 1 % GEL Apply 1 application topically daily.     guaiFENesin (MUCINEX) 600 MG 12 hr tablet Take 600 mg by mouth daily as needed (Allergies).     metoCLOPramide  (REGLAN ) 10 MG tablet Take 1 tablet (10 mg total) by mouth 4 (four) times daily for 8 days. 30 tablet 0   montelukast (SINGULAIR) 10 MG tablet Take 10 mg by mouth at bedtime.     Multiple Vitamins-Minerals (MULTIVITAMIN PO) Take 1 tablet by mouth daily.     ondansetron  (ZOFRAN ) 4 MG tablet Take 1 tablet (4 mg total) by mouth every 4 (four) hours as needed for nausea. 20 tablet 0   ondansetron  (ZOFRAN -ODT) 4 MG disintegrating tablet Take 1 tablet (4 mg total) by mouth every 8 (eight) hours as needed. 20 tablet 0   prochlorperazine  (COMPAZINE ) 25 MG suppository Place 1 suppository (25 mg total) rectally every 6 (six) hours as needed for nausea or vomiting. If oral anti-nausea medication is not helping 12 suppository 0   promethazine  (PHENERGAN ) 12.5 MG tablet TAKE 1 TABLET(12.5 MG) BY MOUTH EVERY 6 HOURS AS NEEDED FOR NAUSEA OR VOMITING 30 tablet 2   RABEprazole  (ACIPHEX ) 20 MG tablet Take 1 tablet (20 mg total) by mouth 2  (two) times daily. 180 tablet 4   sucralfate  (CARAFATE ) 1 g tablet Take 1 tablet (1 g total) by mouth 4 (four) times daily -  with meals and at bedtime. 120 tablet 11   No current facility-administered medications for this visit.   Current Medications[1] Allergies[2] Family History  Problem Relation Age of Onset   Colon polyps Mother    Hyperlipidemia Mother    Colon cancer Mother        in her 94s or 109s   Osteoporosis Mother    Lung cancer Mother    Diabetes Father    Hyperlipidemia Father    Hypertension Father    Breast cancer Maternal Aunt        37's   Pancreatic cancer Neg Hx    Stomach cancer Neg Hx    Liver disease Neg Hx    Inflammatory bowel disease Neg Hx    Esophageal cancer Neg Hx    Rectal cancer Neg Hx    Social History   Socioeconomic History   Marital status: Divorced    Spouse name: Not on file   Number of children: Not on file   Years of education: Not on file   Highest education level: Not on file  Occupational History   Not on file  Tobacco Use   Smoking status:  Never   Smokeless tobacco: Never  Vaping Use   Vaping status: Never Used  Substance and Sexual Activity   Alcohol use: Yes    Alcohol/week: 6.0 standard drinks of alcohol    Types: 4 Glasses of wine, 2 Standard drinks or equivalent per week    Comment: rare (currently none due to stomach issues 01/29/21)   Drug use: No   Sexual activity: Not Currently    Partners: Male    Birth control/protection: Surgical    Comment: Ablation  Other Topics Concern   Not on file  Social History Narrative   Not on file   Social Drivers of Health   Tobacco Use: Low Risk (04/25/2024)   Patient History    Smoking Tobacco Use: Never    Smokeless Tobacco Use: Never    Passive Exposure: Not on file  Financial Resource Strain: Not on file  Food Insecurity: Not on file  Transportation Needs: Not on file  Physical Activity: Not on file  Stress: Not on file  Social Connections: Not on file   Intimate Partner Violence: Not on file  Depression (EYV7-0): Not on file  Alcohol Screen: Not on file  Housing: Not on file  Utilities: Not on file  Health Literacy: Not on file    Physical Exam: There were no vitals filed for this visit. There is no height or weight on file to calculate BMI. GEN: NAD EYE: Sclerae anicteric ENT: MMM CV: Non-tachycardic GI: Soft, NT/ND NEURO:  Alert & Oriented x 3  Lab Results: No results for input(s): WBC, HGB, HCT, PLT in the last 72 hours. BMET No results for input(s): NA, K, CL, CO2, GLUCOSE, BUN, CREATININE, CALCIUM in the last 72 hours. LFT No results for input(s): PROT, ALBUMIN, AST, ALT, ALKPHOS, BILITOT, BILIDIR, IBILI in the last 72 hours. PT/INR No results for input(s): LABPROT, INR in the last 72 hours.   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  The risks and benefits of endoscopic evaluation/treatment were discussed with the patient and/or family; these include but are not limited to the risk of perforation, infection, bleeding, missed lesions, lack of diagnosis, severe illness requiring hospitalization, as well as anesthesia and sedation related illnesses.  The patient's history has been reviewed, patient examined, no change in status, and deemed stable for procedure.  The patient and/or family was provided an opportunity to ask questions and all were answered.  The patient and/or family is agreeable to proceed.    Aloha Finner, MD [facility] Advanced Endoscopy Office # [phone]     [1]  Current Outpatient Medications:    acetaminophen  (TYLENOL ) 500 MG tablet, Take 1,500 mg by mouth every 6 (six) hours as needed for mild pain., Disp: , Rfl:    aspirin 325 MG tablet, Take 325 mg by mouth daily., Disp: , Rfl:    b complex vitamins capsule, Take 1 capsule by mouth daily., Disp: ,  Rfl:    diclofenac Sodium (VOLTAREN) 1 % GEL, Apply 1 application topically daily., Disp: , Rfl:    guaiFENesin (MUCINEX) 600 MG 12 hr tablet, Take 600 mg by mouth daily as needed (Allergies)., Disp: , Rfl:    metoCLOPramide  (REGLAN ) 10 MG tablet, Take 1 tablet (10 mg total) by mouth 4 (four) times daily for 8 days., Disp: 30 tablet, Rfl: 0   montelukast (SINGULAIR) 10 MG tablet, Take 10 mg by mouth at bedtime., Disp: , Rfl:  Multiple Vitamins-Minerals (MULTIVITAMIN PO), Take 1 tablet by mouth daily., Disp: , Rfl:    ondansetron  (ZOFRAN ) 4 MG tablet, Take 1 tablet (4 mg total) by mouth every 4 (four) hours as needed for nausea., Disp: 20 tablet, Rfl: 0   ondansetron  (ZOFRAN -ODT) 4 MG disintegrating tablet, Take 1 tablet (4 mg total) by mouth every 8 (eight) hours as needed., Disp: 20 tablet, Rfl: 0   prochlorperazine  (COMPAZINE ) 25 MG suppository, Place 1 suppository (25 mg total) rectally every 6 (six) hours as needed for nausea or vomiting. If oral anti-nausea medication is not helping, Disp: 12 suppository, Rfl: 0   promethazine  (PHENERGAN ) 12.5 MG tablet, TAKE 1 TABLET(12.5 MG) BY MOUTH EVERY 6 HOURS AS NEEDED FOR NAUSEA OR VOMITING, Disp: 30 tablet, Rfl: 2   RABEprazole  (ACIPHEX ) 20 MG tablet, Take 1 tablet (20 mg total) by mouth 2 (two) times daily., Disp: 180 tablet, Rfl: 4   sucralfate  (CARAFATE ) 1 g tablet, Take 1 tablet (1 g total) by mouth 4 (four) times daily -  with meals and at bedtime., Disp: 120 tablet, Rfl: 11 [2]  Allergies Allergen Reactions   Latex Other (See Comments)    Skin irritation

## 2024-05-10 NOTE — Progress Notes (Signed)
 1052 Robinul 0.1 mg IV given due large amount of secretions upon assessment.  MD made aware, vss

## 2024-05-10 NOTE — Progress Notes (Signed)
 Called to room to assist during endoscopic procedure.  Patient ID and intended procedure confirmed with present staff. Received instructions for my participation in the procedure from the performing physician.

## 2024-05-10 NOTE — Op Note (Signed)
  Endoscopy Center Patient Name: Patricia Hart Procedure Date: 05/10/2024 10:51 AM MRN: [id number] Endoscopist: Aloha Finner , MD, [id number] Age: 63 Referring MD:  Date of Birth: 1960/05/26 Gender: Female Account #: [id number] Procedure:                Upper GI endoscopy Indications:              Stenosis of the duodenum, Follow-up of duodenal                            stenosis, For therapy of duodenal stenosis Medicines:                Monitored Anesthesia Care Procedure:                Pre-Anesthesia Assessment:                           - Prior to the procedure, a History and Physical                            was performed, and patient medications and                            allergies were reviewed. The patient's tolerance of                            previous anesthesia was also reviewed. The risks                            and benefits of the procedure and the sedation                            options and risks were discussed with the patient.                            All questions were answered, and informed consent                            was obtained. Prior Anticoagulants: The patient has                            taken no anticoagulant or antiplatelet agents. ASA                            Grade Assessment: II - A patient with mild systemic                            disease. After reviewing the risks and benefits,                            the patient was deemed in satisfactory condition to                            undergo the procedure.  After obtaining informed consent, the endoscope was                            passed under direct vision. Throughout the                            procedure, the patient's blood pressure, pulse, and                            oxygen saturations were monitored continuously. The                            Olympus scope [id number] was introduced through the                             mouth, and advanced to the second part of duodenum.                            The upper GI endoscopy was accomplished without                            difficulty. The patient tolerated the procedure. Scope In: Scope Out: Findings:                 No gross lesions were noted in the entire esophagus.                           A 1 cm hiatal hernia was present.                           No gross lesions were noted in the entire examined                            stomach.                           No gross lesions were noted in the duodenal bulb.                           An acquired benign-appearing, intrinsic mild                            stenosis was found in the duodenal sweep and was                            traversed. A TTS dilator was passed through the                            scope. Dilation with a 15-16.5-18 mm pyloric                            balloon dilator was performed. Maximum dialtion to  16.5 mm for 1 minute. The dilation site was                            examined following endoscope reinsertion and showed                            moderate mucosal disruption, moderate improvement                            in luminal narrowing and no perforation.                           No gross lesions were noted in the second portion                            of the duodenum. Complications:            No immediate complications. Estimated Blood Loss:     Estimated blood loss was minimal. Impression:               - No gross lesions in the entire esophagus.                           - 1 cm hiatal hernia.                           - No gross lesions in the entire stomach.                           - No gross lesions in the duodenal bulb.                           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 Dilated                            to 16.5 mm with mucosal wrent noted.                           - No gross lesions in the second portion of the                             duodenum. Recommendation:           - The patient will be observed post-procedure,                            until all discharge criteria are met.                           - Discharge patient to home.                           - Patient has a contact number available for                            emergencies. The signs  and symptoms of potential                            delayed complications were discussed with the                            patient. Return to normal activities tomorrow.                            Written discharge instructions were provided to the                            patient.                           - Advance diet as tolerated.                           - Continue present medications.                           - Await pathology results.                           - Discuss with patient consideration of repeat EGD                            to get to 18 mm or allow her to come back PRN.                           - The findings and recommendations were discussed                            with the patient. Aloha Finner, MD 05/10/2024 11:14:22 AM

## 2024-05-10 NOTE — Patient Instructions (Addendum)
 Advance diet as tolerated. Continue present medications.  Awaiting pathology results. Handout provided on hiatal hernia and gastritis.   YOU HAD AN ENDOSCOPIC PROCEDURE TODAY AT THE [facility]:   Refer to the procedure report that was given to you for any specific questions about what was found during the examination.  If the procedure report does not answer your questions, please call your gastroenterologist to clarify.  If you requested that your care partner not be given the details of your procedure findings, then the procedure report has been included in a sealed envelope for you to review at your convenience later.  YOU SHOULD EXPECT: Some feelings of bloating in the abdomen. Passage of more gas than usual.  Walking can help get rid of the air that was put into your GI tract during the procedure and reduce the bloating. If you had a lower endoscopy (such as a colonoscopy or flexible sigmoidoscopy) you may notice spotting of blood in your stool or on the toilet paper. If you underwent a bowel prep for your procedure, you may not have a normal bowel movement for a few days.  Please Note:  You might notice some irritation and congestion in your nose or some drainage.  This is from the oxygen used during your procedure.  There is no need for concern and it should clear up in a day or so.  SYMPTOMS TO REPORT IMMEDIATELY:  Following upper endoscopy (EGD)  Vomiting of blood or coffee ground material  New chest pain or pain under the shoulder blades  Painful or persistently difficult swallowing  New shortness of breath  Fever of 100F or higher  Black, tarry-looking stools  For urgent or emergent issues, a gastroenterologist can be reached at any hour by calling [phone]. Do not use MyChart messaging for urgent concerns.    DIET:  We do recommend a small meal at first, but then you may proceed to your regular diet.  Drink plenty of fluids but you should avoid alcoholic  beverages for 24 hours.  ACTIVITY:  You should plan to take it easy for the rest of today and you should NOT DRIVE or use heavy machinery until tomorrow (because of the sedation medicines used during the test).    FOLLOW UP: Our staff will call the number listed on your records the next business day following your procedure.  We will call around 7:15- 8:00 am to check on you and address any questions or concerns that you may have regarding the information given to you following your procedure. If we do not reach you, we will leave a message.     If any biopsies were taken you will be contacted by phone or by letter within the next 1-3 weeks.  Please call us  at [phone] if you have not heard about the biopsies in 3 weeks.    SIGNATURES/CONFIDENTIALITY: You and/or your care partner have signed paperwork which will be entered into your electronic medical record.  These signatures attest to the fact that that the information above on your After Visit Summary has been reviewed and is understood.  Full responsibility of the confidentiality of this discharge information lies with you and/or your care-partner.

## 2024-05-10 NOTE — Progress Notes (Signed)
Pt. states no medical or surgical changes since previsit or office visit. 

## 2024-05-11 ENCOUNTER — Telehealth: Payer: Self-pay | Admitting: *Deleted

## 2024-05-11 NOTE — Telephone Encounter (Signed)
°  Follow up Call-     05/10/2024   10:10 AM 04/25/2024    9:00 AM 04/28/2022    9:01 AM 03/25/2022    7:59 AM  Call back number  Post procedure Call Back phone  # [phone]  Permission to leave phone message Yes Yes Yes Yes    Post procedure follow up phone call. No answer at number given.  Left message on voicemail.

## 2024-06-16 ENCOUNTER — Encounter: Payer: Self-pay | Admitting: Gastroenterology

## 2024-06-16 ENCOUNTER — Ambulatory Visit: Admitting: Gastroenterology

## 2024-06-16 VITALS — BP 123/75 | HR 79 | Temp 98.2°F | Resp 16 | Ht 61.0 in | Wt 156.0 lb

## 2024-06-16 DIAGNOSIS — K315 Obstruction of duodenum: Secondary | ICD-10-CM | POA: Diagnosis not present

## 2024-06-16 MED ORDER — SODIUM CHLORIDE 0.9 % IV SOLN: 500.0000 mL | Freq: Once | INTRAVENOUS | Status: DC

## 2024-06-16 NOTE — Progress Notes (Signed)
 "  GASTROENTEROLOGY PROCEDURE H&P NOTE   Primary Care Physician: Catalina Bare, MD  HPI: Patricia Hart is a 64 y.o. female who presents for EGD for further treatment of duodenal stricture dilation.  Past Medical History:  Diagnosis Date   Allergy    Arthritis    Blood transfusion without reported diagnosis 1997   Clotting disorder 1997   pregnancy induced dvt- no problems since then   Fibromyalgia    GERD (gastroesophageal reflux disease)    Hepatic steatosis 07/16/2021   History of DVT (deep vein thrombosis)    during pregnancy   History of pulmonary embolism    occurred during pregnancy   Overactive bladder    Past Surgical History:  Procedure Laterality Date   ABDOMINAL EXPOSURE N/A 02/19/2016   Procedure: ABDOMINAL EXPOSURE;  Surgeon: Krystal JULIANNA Doing, MD;  Location: [facility];  Service: Vascular;  Laterality: N/A;   ABLATION  2006   endometrial   ANTERIOR LUMBAR FUSION N/A 02/19/2016   Procedure: Anterior Lumbar Interbody Fusion  - Lumbar five-sacral one;  Surgeon: Alm GORMAN Molt, MD;  Location: [facility];  Service: Neurosurgery;  Laterality: N/A;   BACK SURGERY     BALLOON DILATION N/A 03/17/2021   Procedure: BALLOON DILATION;  Surgeon: Mansouraty, Aloha Raddle., MD;  Location: [facility];  Service: Gastroenterology;  Laterality: N/A;   BALLOON DILATION N/A 04/21/2021   Procedure: BALLOON DILATION;  Surgeon: Wilhelmenia Aloha Raddle., MD;  Location: [facility];  Service: Gastroenterology;  Laterality: N/A;   BIOPSY  03/17/2021   Procedure: BIOPSY;  Surgeon: Wilhelmenia Aloha Raddle., MD;  Location: [facility];  Service: Gastroenterology;;   breast implants     saline   CARPAL TUNNEL RELEASE Right 04/23/2015   Procedure: RIGHT CARPAL TUNNEL RELEASE;  Surgeon: Arley Curia, MD;  Location: [facility];  Service: Orthopedics;  Laterality: Right;   CARPAL TUNNEL RELEASE Left 05/16/2015   Procedure: LEFT CARPAL TUNNEL RELEASE;  Surgeon: Arley Curia, MD;   Location: [facility];  Service: Orthopedics;  Laterality: Left;   COLONIC STENT PLACEMENT N/A 07/16/2021   Procedure: AXIOS STENT PLACEMENT;  Surgeon: Wilhelmenia Aloha Raddle., MD;  Location: [facility];  Service: Gastroenterology;  Laterality: N/A;   COLONOSCOPY     ESOPHAGOGASTRODUODENOSCOPY N/A 07/17/2021   Procedure: ESOPHAGOGASTRODUODENOSCOPY (EGD);  Surgeon: Teressa Toribio SQUIBB, MD;  Location: [facility];  Service: Gastroenterology;  Laterality: N/A;   ESOPHAGOGASTRODUODENOSCOPY N/A 02/14/2024   Procedure: EGD (ESOPHAGOGASTRODUODENOSCOPY);  Surgeon: Wilhelmenia Aloha Raddle., MD;  Location: [facility];  Service: Gastroenterology;  Laterality: N/A;   ESOPHAGOGASTRODUODENOSCOPY N/A 03/20/2024   Procedure: EGD (ESOPHAGOGASTRODUODENOSCOPY);  Surgeon: Wilhelmenia Aloha Raddle., MD;  Location: [facility];  Service: Gastroenterology;  Laterality: N/A;   ESOPHAGOGASTRODUODENOSCOPY (EGD) WITH PROPOFOL  N/A 03/17/2021   Procedure: ESOPHAGOGASTRODUODENOSCOPY (EGD) WITH PROPOFOL ;  Surgeon: Wilhelmenia Aloha Raddle., MD;  Location: [facility];  Service: Gastroenterology;  Laterality: N/A;  Fluoro   ESOPHAGOGASTRODUODENOSCOPY (EGD) WITH PROPOFOL  N/A 04/21/2021   Procedure: ESOPHAGOGASTRODUODENOSCOPY (EGD) WITH PROPOFOL ;  Surgeon: Wilhelmenia Aloha Raddle., MD;  Location: [facility];  Service: Gastroenterology;  Laterality: N/A;  fluoro   ESOPHAGOGASTRODUODENOSCOPY (EGD) WITH PROPOFOL  N/A 05/16/2021   Procedure: ESOPHAGOGASTRODUODENOSCOPY (EGD) WITH PROPOFOL ;  Surgeon: Wilhelmenia Aloha Raddle., MD;  Location: [facility];  Service: Gastroenterology;  Laterality: N/A;   ESOPHAGOGASTRODUODENOSCOPY (EGD) WITH PROPOFOL  N/A 07/16/2021   Procedure: ESOPHAGOGASTRODUODENOSCOPY (EGD) WITH PROPOFOL ;  Surgeon: Wilhelmenia Aloha Raddle., MD;  Location: [facility];  Service: Gastroenterology;  Laterality: N/A;   FOREIGN BODY REMOVAL  07/16/2021  Procedure: FOREIGN BODY REMOVAL;  Surgeon: Wilhelmenia  Aloha Raddle., MD;  Location: [facility];  Service: Gastroenterology;;   CLEDA REMOVAL  07/17/2021   Procedure: STENT REMOVAL;  Surgeon: Teressa Toribio SQUIBB, MD;  Location: [facility];  Service: Gastroenterology;;   TONSILLECTOMY     TRIGGER FINGER RELEASE Right 04/23/2015   Procedure: RELEASE TRIGGER FINGER/A-1 PULLEY RIGHT THUMB;  Surgeon: Arley Curia, MD;  Location: [facility];  Service: Orthopedics;  Laterality: Right;   UPPER GASTROINTESTINAL ENDOSCOPY     WISDOM TOOTH EXTRACTION     Current Outpatient Medications  Medication Sig Dispense Refill   acetaminophen  (TYLENOL ) 500 MG tablet Take 1,500 mg by mouth every 6 (six) hours as needed for mild pain.     aspirin 325 MG tablet Take 325 mg by mouth daily.     b complex vitamins capsule Take 1 capsule by mouth daily.     diclofenac Sodium (VOLTAREN) 1 % GEL Apply 1 application topically daily.     guaiFENesin (MUCINEX) 600 MG 12 hr tablet Take 600 mg by mouth daily as needed (Allergies).     metoCLOPramide  (REGLAN ) 10 MG tablet Take 1 tablet (10 mg total) by mouth 4 (four) times daily for 8 days. 30 tablet 0   montelukast (SINGULAIR) 10 MG tablet Take 10 mg by mouth at bedtime. (Patient not taking: Reported on 05/10/2024)     Multiple Vitamins-Minerals (MULTIVITAMIN PO) Take 1 tablet by mouth daily.     ondansetron  (ZOFRAN ) 4 MG tablet Take 1 tablet (4 mg total) by mouth every 4 (four) hours as needed for nausea. 20 tablet 0   ondansetron  (ZOFRAN -ODT) 4 MG disintegrating tablet Take 1 tablet (4 mg total) by mouth every 8 (eight) hours as needed. 20 tablet 0   prochlorperazine  (COMPAZINE ) 25 MG suppository Place 1 suppository (25 mg total) rectally every 6 (six) hours as needed for nausea or vomiting. If oral anti-nausea medication is not helping 12 suppository 0   promethazine  (PHENERGAN ) 12.5 MG tablet TAKE 1 TABLET(12.5 MG) BY MOUTH EVERY 6 HOURS AS NEEDED FOR NAUSEA OR VOMITING 30 tablet 2   RABEprazole  (ACIPHEX ) 20 MG  tablet Take 1 tablet (20 mg total) by mouth 2 (two) times daily. 180 tablet 4   sucralfate  (CARAFATE ) 1 g tablet Take 1 tablet (1 g total) by mouth 4 (four) times daily -  with meals and at bedtime. 120 tablet 11   Current Facility-Administered Medications  Medication Dose Route Frequency Provider Last Rate Last Admin   0.9 %  sodium chloride  infusion  500 mL Intravenous Once Mansouraty, Aloha Raddle., MD       Current Medications[1] Allergies[2] Family History  Problem Relation Age of Onset   Colon polyps Mother    Hyperlipidemia Mother    Colon cancer Mother        in her 90s or 81s   Osteoporosis Mother    Lung cancer Mother    Diabetes Father    Hyperlipidemia Father    Hypertension Father    Breast cancer Maternal Aunt        53's   Pancreatic cancer Neg Hx    Stomach cancer Neg Hx    Liver disease Neg Hx    Inflammatory bowel disease Neg Hx    Esophageal cancer Neg Hx    Rectal cancer Neg Hx    Social History   Socioeconomic History   Marital status: Divorced    Spouse name: Not on file   Number of children: Not  on file   Years of education: Not on file   Highest education level: Not on file  Occupational History   Not on file  Tobacco Use   Smoking status: Never   Smokeless tobacco: Never  Vaping Use   Vaping status: Never Used  Substance and Sexual Activity   Alcohol use: Yes    Alcohol/week: 6.0 standard drinks of alcohol    Types: 4 Glasses of wine, 2 Standard drinks or equivalent per week    Comment: rare (currently none due to stomach issues 01/29/21)   Drug use: No   Sexual activity: Not Currently    Partners: Male    Birth control/protection: Surgical    Comment: Ablation  Other Topics Concern   Not on file  Social History Narrative   Not on file   Social Drivers of Health   Tobacco Use: Low Risk (05/10/2024)   Patient History    Smoking Tobacco Use: Never    Smokeless Tobacco Use: Never    Passive Exposure: Not on file  Financial Resource  Strain: Not on file  Food Insecurity: Not on file  Transportation Needs: Not on file  Physical Activity: Not on file  Stress: Not on file  Social Connections: Not on file  Intimate Partner Violence: Not on file  Depression (EYV7-0): Not on file  Alcohol Screen: Not on file  Housing: Not on file  Utilities: Not on file  Health Literacy: Not on file    Physical Exam: Today's Vitals   06/16/24 1258  BP: 134/83  Pulse: 75  Temp: 98.2 F (36.8 C)  SpO2: 97%  Weight: 156 lb (70.8 kg)  Height: 5' 1 (1.549 m)   Body mass index is 29.48 kg/m. GEN: NAD EYE: Sclerae anicteric ENT: MMM CV: Non-tachycardic GI: Soft, NT/ND NEURO:  Alert & Oriented x 3  Lab Results: No results for input(s): WBC, HGB, HCT, PLT in the last 72 hours. BMET No results for input(s): NA, K, CL, CO2, GLUCOSE, BUN, CREATININE, CALCIUM in the last 72 hours. LFT No results for input(s): PROT, ALBUMIN, AST, ALT, ALKPHOS, BILITOT, BILIDIR, IBILI in the last 72 hours. PT/INR No results for input(s): LABPROT, INR in the last 72 hours.   Impression / Plan: This is a 64 y.o.female who presents for EGD for further treatment of duodenal stricture dilation.  The risks and benefits of endoscopic evaluation/treatment were discussed with the patient and/or family; these include but are not limited to the risk of perforation, infection, bleeding, missed lesions, lack of diagnosis, severe illness requiring hospitalization, as well as anesthesia and sedation related illnesses.  The patient's history has been reviewed, patient examined, no change in status, and deemed stable for procedure.  The patient and/or family was provided an opportunity to ask questions and all were answered.  The patient and/or family is agreeable to proceed.    Aloha Finner, MD [facility] Office # [phone]     [1]  Current Outpatient Medications:     acetaminophen  (TYLENOL ) 500 MG tablet, Take 1,500 mg by mouth every 6 (six) hours as needed for mild pain., Disp: , Rfl:    aspirin 325 MG tablet, Take 325 mg by mouth daily., Disp: , Rfl:    b complex vitamins capsule, Take 1 capsule by mouth daily., Disp: , Rfl:    diclofenac Sodium (VOLTAREN) 1 % GEL, Apply 1 application topically daily., Disp: , Rfl:    guaiFENesin (MUCINEX) 600 MG 12 hr tablet, Take 600 mg by mouth  daily as needed (Allergies)., Disp: , Rfl:    metoCLOPramide  (REGLAN ) 10 MG tablet, Take 1 tablet (10 mg total) by mouth 4 (four) times daily for 8 days., Disp: 30 tablet, Rfl: 0   montelukast (SINGULAIR) 10 MG tablet, Take 10 mg by mouth at bedtime. (Patient not taking: Reported on 05/10/2024), Disp: , Rfl:    Multiple Vitamins-Minerals (MULTIVITAMIN PO), Take 1 tablet by mouth daily., Disp: , Rfl:    ondansetron  (ZOFRAN ) 4 MG tablet, Take 1 tablet (4 mg total) by mouth every 4 (four) hours as needed for nausea., Disp: 20 tablet, Rfl: 0   ondansetron  (ZOFRAN -ODT) 4 MG disintegrating tablet, Take 1 tablet (4 mg total) by mouth every 8 (eight) hours as needed., Disp: 20 tablet, Rfl: 0   prochlorperazine  (COMPAZINE ) 25 MG suppository, Place 1 suppository (25 mg total) rectally every 6 (six) hours as needed for nausea or vomiting. If oral anti-nausea medication is not helping, Disp: 12 suppository, Rfl: 0   promethazine  (PHENERGAN ) 12.5 MG tablet, TAKE 1 TABLET(12.5 MG) BY MOUTH EVERY 6 HOURS AS NEEDED FOR NAUSEA OR VOMITING, Disp: 30 tablet, Rfl: 2   RABEprazole  (ACIPHEX ) 20 MG tablet, Take 1 tablet (20 mg total) by mouth 2 (two) times daily., Disp: 180 tablet, Rfl: 4   sucralfate  (CARAFATE ) 1 g tablet, Take 1 tablet (1 g total) by mouth 4 (four) times daily -  with meals and at bedtime., Disp: 120 tablet, Rfl: 11  Current Facility-Administered Medications:    0.9 %  sodium chloride  infusion, 500 mL, Intravenous, Once, Mansouraty, Aloha Raddle., MD [2]  Allergies Allergen Reactions    Latex Other (See Comments)    Skin irritation   "

## 2024-06-16 NOTE — Patient Instructions (Addendum)
-  Repeat upper endoscopy in March (appointment scheduled)  YOU HAD AN ENDOSCOPIC PROCEDURE TODAY AT THE [facility]:   Refer to the procedure report that was given to you for any specific questions about what was found during the examination.  If the procedure report does not answer your questions, please call your gastroenterologist to clarify.  If you requested that your care partner not be given the details of your procedure findings, then the procedure report has been included in a sealed envelope for you to review at your convenience later.  YOU SHOULD EXPECT: Some feelings of bloating in the abdomen. Passage of more gas than usual.  Walking can help get rid of the air that was put into your GI tract during the procedure and reduce the bloating. If you had a lower endoscopy (such as a colonoscopy or flexible sigmoidoscopy) you may notice spotting of blood in your stool or on the toilet paper. If you underwent a bowel prep for your procedure, you may not have a normal bowel movement for a few days.  Please Note:  You might notice some irritation and congestion in your nose or some drainage.  This is from the oxygen used during your procedure.  There is no need for concern and it should clear up in a day or so.  SYMPTOMS TO REPORT IMMEDIATELY:  Following upper endoscopy (EGD)  Vomiting of blood or coffee ground material  New chest pain or pain under the shoulder blades  Painful or persistently difficult swallowing  New shortness of breath  Fever of 100F or higher  Black, tarry-looking stools  For urgent or emergent issues, a gastroenterologist can be reached at any hour by calling [phone]. Do not use MyChart messaging for urgent concerns.    DIET:  We do recommend a small meal at first, but then you may proceed to your regular diet.  Drink plenty of fluids but you should avoid alcoholic beverages for 24 hours.  ACTIVITY:  You should plan to take it easy for the rest of  today and you should NOT DRIVE or use heavy machinery until tomorrow (because of the sedation medicines used during the test).    FOLLOW UP: Our staff will call the number listed on your records the next business day following your procedure.  We will call around 7:15- 8:00 am to check on you and address any questions or concerns that you may have regarding the information given to you following your procedure. If we do not reach you, we will leave a message.     If any biopsies were taken you will be contacted by phone or by letter within the next 1-3 weeks.  Please call us  at [phone] if you have not heard about the biopsies in 3 weeks.    SIGNATURES/CONFIDENTIALITY: You and/or your care partner have signed paperwork which will be entered into your electronic medical record.  These signatures attest to the fact that that the information above on your After Visit Summary has been reviewed and is understood.  Full responsibility of the confidentiality of this discharge information lies with you and/or your care-partner.

## 2024-06-16 NOTE — Progress Notes (Signed)
 Called to room to assist during endoscopic procedure.  Patient ID and intended procedure confirmed with present staff. Received instructions for my participation in the procedure from the performing physician.

## 2024-06-16 NOTE — Progress Notes (Signed)
1330 Robinul 0.1 mg IV given due large amount of secretions upon assessment.  MD made aware, vss  °

## 2024-06-16 NOTE — Progress Notes (Signed)
 Report given to PACU, vss

## 2024-06-16 NOTE — Progress Notes (Signed)
 Pt's states no medical or surgical changes since previsit or office visit.

## 2024-06-16 NOTE — Op Note (Signed)
 [facility] Patient Name: Patricia Hart Procedure Date: 06/16/2024 1:41 PM MRN: [id number] Endoscopist: Aloha Finner , MD, [id number] Age: 64 Referring MD:  Date of Birth: 08-14-1960 Gender: Female Account #: [id number] Procedure:                Upper GI endoscopy Indications:              Stenosis of the duodenum Medicines:                Monitored Anesthesia Care Procedure:                Pre-Anesthesia Assessment:                           - Prior to the procedure, a History and Physical                            was performed, and patient medications and                            allergies were reviewed. The patient's tolerance of                            previous anesthesia was also reviewed. The risks                            and benefits of the procedure and the sedation                            options and risks were discussed with the patient.                            All questions were answered, and informed consent                            was obtained. Prior Anticoagulants: The patient has                            taken no anticoagulant or antiplatelet agents. ASA                            Grade Assessment: II - A patient with mild systemic                            disease. After reviewing the risks and benefits,                            the patient was deemed in satisfactory condition to                            undergo the procedure.                           After obtaining informed consent, the endoscope was  passed under direct vision. Throughout the                            procedure, the patient's blood pressure, pulse, and                            oxygen saturations were monitored continuously. The                            Olympus Scope SN [id number] was introduced through the                            mouth, and advanced to the second part of duodenum.                            The upper GI  endoscopy was accomplished without                            difficulty. The patient tolerated the procedure. Scope In: Scope Out: Findings:                 No gross lesions were noted in the entire esophagus.                           The Z-line was irregular and was found 35 cm from                            the incisors.                           No gross lesions were noted in the entire examined                            stomach.                           An acquired benign-appearing, intrinsic moderate                            stenosis was found in the duodenal sweep and was                            traversed with ease. A TTS dilator was passed                            through the scope. Dilation with a 16-17-18 mm                            pyloric balloon dilator was performed. The dilation                            site was examined following endoscope reinsertion  and showed moderate mucosal disruption, moderate                            improvement in luminal narrowing and no perforation.                           No other gross lesions were noted in the duodenal                            bulb, in the first portion of the duodenum and in                            the second portion of the duodenum. Complications:            No immediate complications. Estimated Blood Loss:     Estimated blood loss was minimal. Impression:               - No gross lesions in the entire esophagus. Z-line                            irregular, 35 cm from the incisors.                           - No gross lesions in the entire stomach.                           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 Dilated.                           - No other gross lesions in the duodenal bulb, in                            the first portion of the duodenum and in the second                            portion of the duodenum. Recommendation:           - The patient will be  observed post-procedure,                            until all discharge criteria are met.                           - Discharge patient to home.                           - Patient has a contact number available for                            emergencies. The signs and symptoms of potential                            delayed complications were discussed with the  patient. Return to normal activities tomorrow.                            Written discharge instructions were provided to the                            patient.                           - Resume previous diet.                           - Continue present medications.                           - Repeat upper endoscopy in 3 months for                            retreatment.                           - Recommend SIBO breath testing if issues of                            bloating persist                           - The findings and recommendations were discussed                            with the patient. Aloha Finner, MD 06/16/2024 2:11:22 PM

## 2024-06-20 ENCOUNTER — Telehealth: Payer: Self-pay

## 2024-06-20 NOTE — Telephone Encounter (Signed)
 Left message on follow up call.

## 2024-08-22 ENCOUNTER — Encounter: Admitting: Gastroenterology
